# Patient Record
Sex: Female | Born: 1985 | Race: Black or African American | Hispanic: No | Marital: Married | Smoking: Never smoker
Health system: Southern US, Community
[De-identification: ages and names within clinical notes are randomized; demographics above are authoritative.]

## PROBLEM LIST (undated history)

## (undated) DIAGNOSIS — M549 Dorsalgia, unspecified: Secondary | ICD-10-CM

## (undated) DIAGNOSIS — Z8719 Personal history of other diseases of the digestive system: Secondary | ICD-10-CM

## (undated) DIAGNOSIS — F32A Depression, unspecified: Secondary | ICD-10-CM

## (undated) DIAGNOSIS — K219 Gastro-esophageal reflux disease without esophagitis: Secondary | ICD-10-CM

## (undated) DIAGNOSIS — M2550 Pain in unspecified joint: Secondary | ICD-10-CM

## (undated) DIAGNOSIS — F329 Major depressive disorder, single episode, unspecified: Secondary | ICD-10-CM

## (undated) DIAGNOSIS — E559 Vitamin D deficiency, unspecified: Secondary | ICD-10-CM

## (undated) DIAGNOSIS — K859 Acute pancreatitis without necrosis or infection, unspecified: Secondary | ICD-10-CM

## (undated) DIAGNOSIS — D649 Anemia, unspecified: Secondary | ICD-10-CM

## (undated) DIAGNOSIS — Z8711 Personal history of peptic ulcer disease: Secondary | ICD-10-CM

## (undated) DIAGNOSIS — K5090 Crohn's disease, unspecified, without complications: Secondary | ICD-10-CM

## (undated) DIAGNOSIS — F419 Anxiety disorder, unspecified: Secondary | ICD-10-CM

## (undated) HISTORY — DX: Depression, unspecified: F32.A

## (undated) HISTORY — PX: ABDOMINAL SURGERY: SHX537

## (undated) HISTORY — DX: Personal history of peptic ulcer disease: Z87.11

## (undated) HISTORY — DX: Dorsalgia, unspecified: M54.9

## (undated) HISTORY — DX: Gastro-esophageal reflux disease without esophagitis: K21.9

## (undated) HISTORY — DX: Pain in unspecified joint: M25.50

## (undated) HISTORY — PX: TOTAL COLECTOMY: SHX852

## (undated) HISTORY — DX: Vitamin D deficiency, unspecified: E55.9

---
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
## 2005-07-08 ENCOUNTER — Encounter: Admission: RE | Admit: 2005-07-08 | Discharge: 2005-07-08 | Payer: Self-pay

## 2006-08-10 ENCOUNTER — Emergency Department (HOSPITAL_COMMUNITY): Admission: EM | Admit: 2006-08-10 | Discharge: 2006-08-10 | Payer: Self-pay | Admitting: Emergency Medicine

## 2007-05-04 ENCOUNTER — Emergency Department (HOSPITAL_COMMUNITY): Admission: EM | Admit: 2007-05-04 | Discharge: 2007-05-05 | Payer: Self-pay | Admitting: Emergency Medicine

## 2008-05-18 ENCOUNTER — Emergency Department (HOSPITAL_COMMUNITY): Admission: EM | Admit: 2008-05-18 | Discharge: 2008-05-19 | Payer: Self-pay | Admitting: Emergency Medicine

## 2008-10-24 ENCOUNTER — Emergency Department (HOSPITAL_COMMUNITY): Admission: EM | Admit: 2008-10-24 | Discharge: 2008-10-25 | Payer: Self-pay | Admitting: Emergency Medicine

## 2010-02-26 ENCOUNTER — Emergency Department (HOSPITAL_COMMUNITY): Admission: EM | Admit: 2010-02-26 | Discharge: 2010-02-26 | Payer: Self-pay | Admitting: Emergency Medicine

## 2010-04-23 ENCOUNTER — Emergency Department (HOSPITAL_COMMUNITY): Admission: EM | Admit: 2010-04-23 | Discharge: 2010-04-23 | Payer: Self-pay | Admitting: Emergency Medicine

## 2010-05-09 ENCOUNTER — Ambulatory Visit: Payer: Self-pay | Admitting: Diagnostic Radiology

## 2010-05-09 ENCOUNTER — Emergency Department (HOSPITAL_BASED_OUTPATIENT_CLINIC_OR_DEPARTMENT_OTHER): Admission: EM | Admit: 2010-05-09 | Discharge: 2010-05-09 | Payer: Self-pay | Admitting: Emergency Medicine

## 2010-12-03 ENCOUNTER — Emergency Department (HOSPITAL_BASED_OUTPATIENT_CLINIC_OR_DEPARTMENT_OTHER)
Admission: EM | Admit: 2010-12-03 | Discharge: 2010-12-03 | Payer: Self-pay | Source: Home / Self Care | Admitting: Emergency Medicine

## 2010-12-06 LAB — DIFFERENTIAL
Basophils Absolute: 0 10*3/uL (ref 0.0–0.1)
Basophils Relative: 0 % (ref 0–1)
Eosinophils Absolute: 0.5 10*3/uL (ref 0.0–0.7)
Eosinophils Relative: 11 % — ABNORMAL HIGH (ref 0–5)
Lymphocytes Relative: 36 % (ref 12–46)
Lymphs Abs: 1.8 10*3/uL (ref 0.7–4.0)
Monocytes Absolute: 0.7 10*3/uL (ref 0.1–1.0)
Monocytes Relative: 14 % — ABNORMAL HIGH (ref 3–12)
Neutro Abs: 1.9 10*3/uL (ref 1.7–7.7)
Neutrophils Relative %: 38 % — ABNORMAL LOW (ref 43–77)

## 2010-12-06 LAB — URINALYSIS, ROUTINE W REFLEX MICROSCOPIC
Bilirubin Urine: NEGATIVE
Hgb urine dipstick: NEGATIVE
Ketones, ur: NEGATIVE mg/dL
Nitrite: NEGATIVE
Protein, ur: NEGATIVE mg/dL
Specific Gravity, Urine: 1.012 (ref 1.005–1.030)
Urine Glucose, Fasting: NEGATIVE mg/dL
Urobilinogen, UA: 0.2 mg/dL (ref 0.0–1.0)
pH: 5 (ref 5.0–8.0)

## 2010-12-06 LAB — URINE MICROSCOPIC-ADD ON

## 2010-12-06 LAB — COMPREHENSIVE METABOLIC PANEL
ALT: 12 U/L (ref 0–35)
AST: 15 U/L (ref 0–37)
Albumin: 3.5 g/dL (ref 3.5–5.2)
Alkaline Phosphatase: 80 U/L (ref 39–117)
BUN: 4 mg/dL — ABNORMAL LOW (ref 6–23)
CO2: 23 mEq/L (ref 19–32)
Calcium: 8.7 mg/dL (ref 8.4–10.5)
Chloride: 109 mEq/L (ref 96–112)
Creatinine, Ser: 0.7 mg/dL (ref 0.4–1.2)
GFR calc Af Amer: >60 mL/min (ref >60)
GFR calc non Af Amer: >60 mL/min (ref >60)
Glucose, Bld: 87 mg/dL (ref 70–99)
Potassium: 3.9 mEq/L (ref 3.5–5.1)
Sodium: 143 mEq/L (ref 135–145)
Total Bilirubin: 0.4 mg/dL (ref 0.3–1.2)
Total Protein: 7.8 g/dL (ref 6.0–8.3)

## 2010-12-06 LAB — CBC
HCT: 29.7 % — ABNORMAL LOW (ref 36.0–46.0)
Hemoglobin: 9.7 g/dL — ABNORMAL LOW (ref 12.0–15.0)
MCH: 23.9 pg — ABNORMAL LOW (ref 26.0–34.0)
MCHC: 32.7 g/dL (ref 30.0–36.0)
MCV: 73.2 fL — ABNORMAL LOW (ref 78.0–100.0)
Platelets: 348 10*3/uL (ref 150–400)
RBC: 4.06 MIL/uL (ref 3.87–5.11)
RDW: 17 % — ABNORMAL HIGH (ref 11.5–15.5)
WBC: 4.9 10*3/uL (ref 4.0–10.5)

## 2010-12-06 LAB — LIPASE, BLOOD: Lipase: 96 U/L (ref 23–300)

## 2011-02-06 LAB — URINALYSIS, ROUTINE W REFLEX MICROSCOPIC
Bilirubin Urine: NEGATIVE
Glucose, UA: NEGATIVE mg/dL
Hgb urine dipstick: NEGATIVE
Ketones, ur: >80 mg/dL — AB
Nitrite: NEGATIVE
Protein, ur: NEGATIVE mg/dL
Specific Gravity, Urine: 1.007 (ref 1.005–1.030)
Urobilinogen, UA: 0.2 mg/dL (ref 0.0–1.0)
pH: 5.5 (ref 5.0–8.0)

## 2011-02-06 LAB — COMPREHENSIVE METABOLIC PANEL
ALT: 16 U/L (ref 0–35)
AST: 25 U/L (ref 0–37)
Albumin: 3.5 g/dL (ref 3.5–5.2)
Alkaline Phosphatase: 103 U/L (ref 39–117)
BUN: <2 mg/dL — ABNORMAL LOW (ref 6–23)
CO2: 21 mEq/L (ref 19–32)
Calcium: 8.5 mg/dL (ref 8.4–10.5)
Chloride: 104 mEq/L (ref 96–112)
Creatinine, Ser: 0.6 mg/dL (ref 0.4–1.2)
GFR calc Af Amer: >60 mL/min (ref >60)
GFR calc non Af Amer: >60 mL/min (ref >60)
Glucose, Bld: 73 mg/dL (ref 70–99)
Potassium: 4 mEq/L (ref 3.5–5.1)
Sodium: 137 mEq/L (ref 135–145)
Total Bilirubin: 0.4 mg/dL (ref 0.3–1.2)
Total Protein: 8.1 g/dL (ref 6.0–8.3)

## 2011-02-06 LAB — DIFFERENTIAL
Basophils Absolute: 0.1 10*3/uL (ref 0.0–0.1)
Basophils Relative: 1 % (ref 0–1)
Eosinophils Absolute: 0.3 10*3/uL (ref 0.0–0.7)
Eosinophils Relative: 5 % (ref 0–5)
Lymphocytes Relative: 33 % (ref 12–46)
Lymphs Abs: 2.1 10*3/uL (ref 0.7–4.0)
Monocytes Absolute: 0.5 10*3/uL (ref 0.1–1.0)
Monocytes Relative: 7 % (ref 3–12)
Neutro Abs: 3.6 10*3/uL (ref 1.7–7.7)
Neutrophils Relative %: 55 % (ref 43–77)

## 2011-02-06 LAB — CBC
HCT: 35 % — ABNORMAL LOW (ref 36.0–46.0)
Hemoglobin: 11.4 g/dL — ABNORMAL LOW (ref 12.0–15.0)
MCHC: 32.5 g/dL (ref 30.0–36.0)
MCV: 79.4 fL (ref 78.0–100.0)
Platelets: 253 10*3/uL (ref 150–400)
RBC: 4.41 MIL/uL (ref 3.87–5.11)
RDW: 16.4 % — ABNORMAL HIGH (ref 11.5–15.5)
WBC: 6.6 10*3/uL (ref 4.0–10.5)

## 2011-02-06 LAB — URINE MICROSCOPIC-ADD ON

## 2011-02-06 LAB — URINE CULTURE
Colony Count: NO GROWTH
Culture: NO GROWTH

## 2011-02-06 LAB — LIPASE, BLOOD: Lipase: 46 U/L (ref 23–300)

## 2011-02-06 LAB — PREGNANCY, URINE: Preg Test, Ur: NEGATIVE

## 2011-02-09 LAB — BASIC METABOLIC PANEL
BUN: 6 mg/dL (ref 6–23)
CO2: 23 mEq/L (ref 19–32)
Calcium: 8.6 mg/dL (ref 8.4–10.5)
Chloride: 110 mEq/L (ref 96–112)
Creatinine, Ser: 0.63 mg/dL (ref 0.4–1.2)
GFR calc Af Amer: >60 mL/min (ref >60)
GFR calc non Af Amer: >60 mL/min (ref >60)
Glucose, Bld: 86 mg/dL (ref 70–99)
Potassium: 3.6 mEq/L (ref 3.5–5.1)
Sodium: 137 mEq/L (ref 135–145)

## 2011-02-09 LAB — CBC
HCT: 31.6 % — ABNORMAL LOW (ref 36.0–46.0)
Hemoglobin: 10.4 g/dL — ABNORMAL LOW (ref 12.0–15.0)
MCHC: 32.9 g/dL (ref 30.0–36.0)
MCV: 82.5 fL (ref 78.0–100.0)
Platelets: 238 10*3/uL (ref 150–400)
RBC: 3.83 MIL/uL — ABNORMAL LOW (ref 3.87–5.11)
RDW: 15.3 % (ref 11.5–15.5)
WBC: 5 10*3/uL (ref 4.0–10.5)

## 2011-02-09 LAB — URINALYSIS, ROUTINE W REFLEX MICROSCOPIC
Bilirubin Urine: NEGATIVE
Glucose, UA: NEGATIVE mg/dL
Hgb urine dipstick: NEGATIVE
Ketones, ur: NEGATIVE mg/dL
Nitrite: NEGATIVE
Protein, ur: NEGATIVE mg/dL
Specific Gravity, Urine: 1.011 (ref 1.005–1.030)
Urobilinogen, UA: 0.2 mg/dL (ref 0.0–1.0)
pH: 5 (ref 5.0–8.0)

## 2011-02-09 LAB — DIFFERENTIAL
Eosinophils Absolute: 0.2 10*3/uL (ref 0.0–0.7)
Lymphocytes Relative: 23 % (ref 12–46)
Lymphs Abs: 1.1 10*3/uL (ref 0.7–4.0)
Monocytes Relative: 13 % — ABNORMAL HIGH (ref 3–12)
Neutro Abs: 3 10*3/uL (ref 1.7–7.7)
Neutrophils Relative %: 60 % (ref 43–77)

## 2011-02-09 LAB — SEDIMENTATION RATE: Sed Rate: 137 mm/hr — ABNORMAL HIGH (ref 0–22)

## 2011-02-09 LAB — POCT PREGNANCY, URINE: Preg Test, Ur: NEGATIVE

## 2011-02-23 ENCOUNTER — Inpatient Hospital Stay (INDEPENDENT_AMBULATORY_CARE_PROVIDER_SITE_OTHER)
Admission: RE | Admit: 2011-02-23 | Discharge: 2011-02-23 | Disposition: A | Discharge status: Home / Self Care | Payer: Medicare Other | Source: Ambulatory Visit | Attending: Emergency Medicine | Admitting: Emergency Medicine

## 2011-02-23 DIAGNOSIS — L03119 Cellulitis of unspecified part of limb: Secondary | ICD-10-CM

## 2011-02-26 LAB — CULTURE, ROUTINE-ABSCESS: Gram Stain: NONE SEEN

## 2011-06-15 ENCOUNTER — Emergency Department (HOSPITAL_BASED_OUTPATIENT_CLINIC_OR_DEPARTMENT_OTHER)
Admission: EM | Admit: 2011-06-15 | Discharge: 2011-06-16 | Disposition: A | Discharge status: Home / Self Care | Payer: Medicare Other | Attending: Emergency Medicine | Admitting: Emergency Medicine

## 2011-06-15 ENCOUNTER — Encounter: Payer: Self-pay | Admitting: *Deleted

## 2011-06-15 ENCOUNTER — Emergency Department (INDEPENDENT_AMBULATORY_CARE_PROVIDER_SITE_OTHER): Payer: Medicare Other

## 2011-06-15 DIAGNOSIS — S335XXA Sprain of ligaments of lumbar spine, initial encounter: Secondary | ICD-10-CM | POA: Insufficient documentation

## 2011-06-15 DIAGNOSIS — N390 Urinary tract infection, site not specified: Secondary | ICD-10-CM | POA: Insufficient documentation

## 2011-06-15 DIAGNOSIS — K5090 Crohn's disease, unspecified, without complications: Secondary | ICD-10-CM | POA: Insufficient documentation

## 2011-06-15 DIAGNOSIS — X58XXXA Exposure to other specified factors, initial encounter: Secondary | ICD-10-CM | POA: Insufficient documentation

## 2011-06-15 DIAGNOSIS — S39012A Strain of muscle, fascia and tendon of lower back, initial encounter: Secondary | ICD-10-CM

## 2011-06-15 HISTORY — DX: Crohn's disease, unspecified, without complications: K50.90

## 2011-06-15 LAB — URINALYSIS, ROUTINE W REFLEX MICROSCOPIC
Glucose, UA: NEGATIVE mg/dL
Ketones, ur: NEGATIVE mg/dL
Protein, ur: NEGATIVE mg/dL
pH: 5.5 (ref 5.0–8.0)

## 2011-06-15 LAB — URINE MICROSCOPIC-ADD ON

## 2011-06-15 MED ORDER — DEXAMETHASONE SODIUM PHOSPHATE 10 MG/ML IJ SOLN
10.0000 mg | Freq: Once | INTRAMUSCULAR | Status: AC
  Administered 2011-06-15: 10 mg via INTRAMUSCULAR
  Filled 2011-06-15: qty 1

## 2011-06-15 MED ORDER — METHOCARBAMOL 500 MG PO TABS
1000.0000 mg | ORAL_TABLET | Freq: Once | ORAL | Status: AC
  Administered 2011-06-15: 1000 mg via ORAL
  Filled 2011-06-15: qty 2

## 2011-06-15 MED ORDER — FENTANYL CITRATE 0.05 MG/ML IJ SOLN
50.0000 ug | Freq: Once | INTRAMUSCULAR | Status: AC
  Administered 2011-06-15: 50 ug via INTRAMUSCULAR
  Filled 2011-06-15: qty 2

## 2011-06-15 MED ORDER — KETOROLAC TROMETHAMINE 60 MG/2ML IM SOLN
60.0000 mg | Freq: Once | INTRAMUSCULAR | Status: AC
  Administered 2011-06-15: 60 mg via INTRAMUSCULAR
  Filled 2011-06-15: qty 2

## 2011-06-15 NOTE — ED Notes (Signed)
Pt c/o bil lower back pain x 1 week  

## 2011-06-16 DIAGNOSIS — M545 Low back pain: Secondary | ICD-10-CM

## 2011-06-16 MED ORDER — METHOCARBAMOL 500 MG PO TABS: 500.0000 mg | ORAL_TABLET | Freq: Two times a day (BID) | ORAL | Status: AC

## 2011-06-16 MED ORDER — CIPROFLOXACIN HCL 500 MG PO TABS: 500.0000 mg | ORAL_TABLET | Freq: Two times a day (BID) | ORAL | Status: AC

## 2011-06-16 MED ORDER — CIPROFLOXACIN HCL 500 MG PO TABS
500.0000 mg | ORAL_TABLET | Freq: Once | ORAL | Status: AC
  Administered 2011-06-16: 500 mg via ORAL
  Filled 2011-06-16: qty 1

## 2011-06-16 MED ORDER — OXYCODONE-ACETAMINOPHEN 5-325 MG PO TABS: 1.0000 | ORAL_TABLET | Freq: Four times a day (QID) | ORAL | Status: AC | PRN

## 2011-06-16 NOTE — Discharge Instructions (Signed)
Urinary Tract Infection (UTI) Infections of the urinary tract can start in several places. A bladder infection (cystitis), a kidney infection (pyelonephritis), and a prostate infection (prostatitis) are different types of urinary tract infections. They usually get better if treated with medicines (antibiotics) that kill germs. Take all the medicine until it is gone. You or your child may feel better in a few days, but TAKE ALL MEDICINE or the infection may not respond and may become more difficult to treat. HOME CARE INSTRUCTIONS  Drink enough water and fluids to keep the urine clear or pale yellow. Cranberry juice is especially recommended, in addition to large amounts of water.   Avoid caffeine, tea, and carbonated beverages. They tend to irritate the bladder.   Alcohol may irritate the prostate.   Only take over-the-counter or prescription medicines for pain, discomfort, or fever as directed by your caregiver.  FINDING OUT THE RESULTS OF YOUR TEST Not all test results are available during your visit. If your or your child's test results are not back during the visit, make an appointment with your caregiver to find out the results. Do not assume everything is normal if you have not heard from your caregiver or the medical facility. It is important for you to follow up on all test results. TO PREVENT FURTHER INFECTIONS:  Empty the bladder often. Avoid holding urine for long periods of time.   After a bowel movement, women should cleanse from front to back. Use each tissue only once.   Empty the bladder before and after sexual intercourse.  SEEK MEDICAL CARE IF:  There is back pain.   You or your child has an oral temperature above 101 Urinary Tract Infection (UTI) Infections of the urinary tract can start in several places. A bladder infection (cystitis), a kidney infection (pyelonephritis), and a prostate infection (prostatitis) are different types of urinary tract infections. They  usually get better if treated with medicines (antibiotics) that kill germs. Take all the medicine until it is gone. You or your child may feel better in a few days, but TAKE ALL MEDICINE or the infection may not respond and may become more difficult to treat. HOME CARE INSTRUCTIONS Drink enough water and fluids to keep the urine clear or pale yellow. Cranberry juice is especially recommended, in addition to large amounts of water.  Avoid caffeine, tea, and carbonated beverages. They tend to irritate the bladder.  Alcohol may irritate the prostate.  Only take over-the-counter or prescription medicines for pain, discomfort, or fever as directed by your caregiver.  FINDING OUT THE RESULTS OF YOUR TEST Not all test results are available during your visit. If your or your child's test results are not back during the visit, make an appointment with your caregiver to find out the results. Do not assume everything is normal if you have not heard from your caregiver or the medical facility. It is important for you to follow up on all test results. TO PREVENT FURTHER INFECTIONS: Empty the bladder often. Avoid holding urine for long periods of time.  After a bowel movement, women should cleanse from front to back. Use each tissue only once.  Empty the bladder before and after sexual intercourse.  SEEK MEDICAL CARE IF: There is back pain.  You or your child has an oral temperature above 101.  Your baby is older than 3 months with a rectal temperature of 100.5 F (38.1 C) or higher for more than 1 day.  Your or your child's problems (symptoms) are  no better in 3 days. Return sooner if you or your child is getting worse.  SEEK IMMEDIATE MEDICAL CARE IF: There is severe back pain or lower abdominal pain.  You or your child develops chills.  You or your child has an oral temperature above 101, not controlled by medicine.  Your baby is older than 3 months with a rectal temperature of 102 F (38.9 C) or higher.   Your baby is 86 months old or younger with a rectal temperature of 100.4 F (38 C) or higher.  There is nausea or vomiting.  There is continued burning or discomfort with urination.  MAKE SURE YOU: Understand these instructions.  Will watch this condition.  Will get help right away if you or your child is not doing well or gets worse.  Document Released: 08/17/2005 Document Re-Released: 02/01/2010  [facility] Patient Information 2011 [place].   Your baby is older than 3 months with a rectal temperature of 100.5 F (38.1 C) or higher for more than 1 day.   Your or your child's problems (symptoms) are no better in 3 days. Return sooner if you or your child is getting worse.  SEEK IMMEDIATE MEDICAL CARE IF:  There is severe back pain or lower abdominal pain.   You or your child develops chills.   You or your child has an oral temperature above 101.4, not controlled by medicine.   Your baby is older than 3 months with a rectal temperature of 102 F (38.9 C) or higher.   Your baby is 77 months old or younger with a rectal temperature of 100.4 F (38 C) or higher.   There is nausea or vomiting.   There is continued burning or discomfort with urination.  MAKE SURE YOU:  Understand these instructions.   Will watch this condition.   Will get help right away if you or your child is not doing well or gets worse.  Document Released: 08/17/2005 Document Re-Released: 02/01/2010 [facility] Patient Information 2011 [place].Urinary Tract Infection (UTI) Infections of the urinary tract can start in several places. A bladder infection (cystitis), a kidney infection (pyelonephritis), and a prostate infection (prostatitis) are different types of urinary tract infections. They usually get better if treated with medicines (antibiotics) that kill germs. Take all the medicine until it is gone. You or your child may feel better in a few days, but TAKE ALL MEDICINE or the infection may not  respond and may become more difficult to treat. HOME CARE INSTRUCTIONS  Drink enough water and fluids to keep the urine clear or pale yellow. Cranberry juice is especially recommended, in addition to large amounts of water.   Avoid caffeine, tea, and carbonated beverages. They tend to irritate the bladder.   Alcohol may irritate the prostate.   Only take over-the-counter or prescription medicines for pain, discomfort, or fever as directed by your caregiver.  FINDING OUT THE RESULTS OF YOUR TEST Not all test results are available during your visit. If your or your child's test results are not back during the visit, make an appointment with your caregiver to find out the results. Do not assume everything is normal if you have not heard from your caregiver or the medical facility. It is important for you to follow up on all test results. TO PREVENT FURTHER INFECTIONS:  Empty the bladder often. Avoid holding urine for long periods of time.   After a bowel movement, women should cleanse from front to back. Use each tissue only once.  Empty the bladder before and after sexual intercourse.  SEEK MEDICAL CARE IF:  There is back pain.   Your baby is older than 3 months with a rectal temperature of 100.5 F (38.1 C) or higher for more than 1 day.   Your or your child's problems (symptoms) are no better in 3 days. Return sooner if you or your child is getting worse.  SEEK IMMEDIATE MEDICAL CARE IF:  There is severe back pain or lower abdominal pain.   You or your child develops chills.   You or your child has an oral temperature above 101, not controlled by medicine.   There is nausea or vomiting.   There is continued burning or discomfort with urination.  MAKE SURE YOU:  Understand these instructions.   Will watch this condition.   Will get help right away if you or your child is not doing well or gets worse.  Document Released: 08/17/2005 Document Re-Released:  02/01/2010 [facility] Patient Information 2011 [place].

## 2011-06-16 NOTE — ED Provider Notes (Signed)
History     Chief Complaint  Patient presents with  . Back Pain   Patient is a 25 y.o. female presenting with back pain. The history is provided by the patient and the spouse. No language interpreter was used.  Back Pain  This is a new problem. The current episode started more than 2 days ago. The problem occurs constantly. The problem has not changed since onset.The pain is associated with no known injury. The pain is present in the lumbar spine and sacro-iliac joint. The quality of the pain is described as stabbing. The pain does not radiate. The pain is at a severity of 10/10. The pain is severe. The symptoms are aggravated by bending, twisting and certain positions. The pain is the same all the time. Pertinent negatives include no chest pain, no fever, no numbness, no weight loss, no headaches, no abdominal pain, no abdominal swelling, no bowel incontinence, no perianal numbness, no bladder incontinence, no dysuria, no pelvic pain, no leg pain, no paresthesias, no paresis, no tingling and no weakness. She has tried heat and analgesics for the symptoms. The treatment provided no relief. Risk factors: none.    Past Medical History  Diagnosis Date  . Crohn disease     Past Surgical History  Procedure Date  . Abdominal surgery     History reviewed. No pertinent family history.  History  Substance Use Topics  . Smoking status: Never Smoker   . Smokeless tobacco: Not on file  . Alcohol Use: No    OB History    Grav Para Term Preterm Abortions TAB SAB Ect Mult Living                  Review of Systems  Constitutional: Negative for fever, weight loss and activity change.  HENT: Negative for facial swelling.   Eyes: Negative for discharge.  Respiratory: Negative for apnea and chest tightness.   Cardiovascular: Negative for chest pain.  Gastrointestinal: Negative for abdominal pain, abdominal distention and bowel incontinence.  Genitourinary: Negative for bladder incontinence,  dysuria, frequency, hematuria, flank pain, difficulty urinating and pelvic pain.  Musculoskeletal: Positive for back pain. Negative for joint swelling and gait problem.  Skin: Negative.   Neurological: Negative for tingling, weakness, numbness, headaches and paresthesias.  Hematological: Negative for adenopathy.  Psychiatric/Behavioral: Negative.     Physical Exam  BP 92/77  Pulse 88  Temp(Src) 99.2 F (37.3 C) (Oral)  Resp 16  Wt 140 lb (63.504 kg)  SpO2 100%  LMP 06/15/2011  Physical Exam  Constitutional: She is oriented to person, place, and time. She appears well-developed and well-nourished. No distress.  HENT:  Head: Normocephalic and atraumatic.  Mouth/Throat: No oropharyngeal exudate.  Eyes: EOM are normal. Pupils are equal, round, and reactive to light.  Neck: Normal range of motion. Neck supple.  Cardiovascular: Normal rate and regular rhythm.  Exam reveals no gallop and no friction rub.   No murmur heard. Pulmonary/Chest: Effort normal and breath sounds normal. No respiratory distress.  Abdominal: Soft. Bowel sounds are normal.  Musculoskeletal: Normal range of motion. She exhibits no edema and no tenderness.       Cervical back: Normal. She exhibits normal range of motion, no tenderness, no bony tenderness and no swelling.       Thoracic back: Normal. She exhibits normal range of motion and no tenderness.       Lumbar back: Normal. She exhibits normal range of motion, no tenderness, no bony tenderness and no swelling.  Neurological: She is alert and oriented to person, place, and time. She has normal reflexes. Coordination normal.       L5/s1 intact intact perineal sensation  Skin: Skin is warm and dry. She is not diaphoretic.  Psychiatric: She has a normal mood and affect.    ED Course  Procedures  MDM Patient markedly improved: instructed to return for fever, inability to tolerate medication, weakness, numbness, abdominal pain or any concerns.  Follow up with  family doctor within 48 hours.  Patient and husband verbalize understanding and agree to follow up      Freddrick Gladson Smitty Cords, MD 06/16/11 9604

## 2011-08-18 LAB — COMPREHENSIVE METABOLIC PANEL
AST: 16
Albumin: 2.5 — ABNORMAL LOW
BUN: 9
Creatinine, Ser: 0.9
GFR calc Af Amer: >60
Potassium: 3.6
Total Protein: 7.8

## 2011-08-18 LAB — POCT PREGNANCY, URINE: Preg Test, Ur: NEGATIVE

## 2011-08-18 LAB — DIFFERENTIAL
Basophils Relative: 0
Lymphocytes Relative: 9 — ABNORMAL LOW
Monocytes Relative: 9
Neutro Abs: 7.4

## 2011-08-18 LAB — CBC
HCT: 34.2 — ABNORMAL LOW
MCV: 67.2 — ABNORMAL LOW
Platelets: 422 — ABNORMAL HIGH
RDW: 19.8 — ABNORMAL HIGH

## 2011-08-18 LAB — LIPASE, BLOOD: Lipase: 77 — ABNORMAL HIGH

## 2011-08-18 LAB — URINE MICROSCOPIC-ADD ON

## 2011-08-18 LAB — URINALYSIS, ROUTINE W REFLEX MICROSCOPIC
Nitrite: NEGATIVE
Specific Gravity, Urine: 1.023
pH: 6

## 2011-08-26 LAB — POCT I-STAT, CHEM 8
Creatinine, Ser: 0.8 mg/dL (ref 0.4–1.2)
Glucose, Bld: 93 mg/dL (ref 70–99)
Hemoglobin: 10.5 g/dL — ABNORMAL LOW (ref 12.0–15.0)
Sodium: 138 mEq/L (ref 135–145)
TCO2: 24 mmol/L (ref 0–100)

## 2011-08-26 LAB — CBC
Hemoglobin: 9.3 g/dL — ABNORMAL LOW (ref 12.0–15.0)
MCHC: 31.4 g/dL (ref 30.0–36.0)
Platelets: 437 10*3/uL — ABNORMAL HIGH (ref 150–400)
RDW: 19.6 % — ABNORMAL HIGH (ref 11.5–15.5)

## 2011-08-26 LAB — DIFFERENTIAL
Basophils Relative: 0 % (ref 0–1)
Eosinophils Absolute: 0.3 10*3/uL (ref 0.0–0.7)
Monocytes Absolute: 0.8 10*3/uL (ref 0.1–1.0)
Neutrophils Relative %: 41 % — ABNORMAL LOW (ref 43–77)

## 2011-09-08 LAB — DIFFERENTIAL
Basophils Absolute: 0
Basophils Relative: 1
Eosinophils Absolute: 0.2
Eosinophils Relative: 4
Lymphocytes Relative: 43
Lymphs Abs: 2.2
Monocytes Absolute: 0.5
Monocytes Relative: 10
Neutro Abs: 2.3
Neutrophils Relative %: 43

## 2011-09-08 LAB — COMPREHENSIVE METABOLIC PANEL
ALT: 19
AST: 17
Albumin: 2.6 — ABNORMAL LOW
Calcium: 8.9
GFR calc Af Amer: >60
Sodium: 139
Total Protein: 7.9

## 2011-09-08 LAB — URINE MICROSCOPIC-ADD ON

## 2011-09-08 LAB — URINALYSIS, ROUTINE W REFLEX MICROSCOPIC
Bilirubin Urine: NEGATIVE
Glucose, UA: NEGATIVE
Nitrite: NEGATIVE
Specific Gravity, Urine: 1.018
pH: 6

## 2011-09-08 LAB — CBC
MCHC: 32.1
RBC: 4.54
RDW: 16.1 — ABNORMAL HIGH

## 2011-09-08 LAB — PREGNANCY, URINE: Preg Test, Ur: NEGATIVE

## 2012-10-09 ENCOUNTER — Other Ambulatory Visit (HOSPITAL_COMMUNITY)
Admission: RE | Admit: 2012-10-09 | Discharge: 2012-10-09 | Disposition: A | Discharge status: Home / Self Care | Payer: 59 | Source: Ambulatory Visit | Attending: Obstetrics and Gynecology | Admitting: Obstetrics and Gynecology

## 2012-10-09 ENCOUNTER — Other Ambulatory Visit: Payer: Self-pay | Admitting: Nurse Practitioner

## 2012-10-09 DIAGNOSIS — Z01419 Encounter for gynecological examination (general) (routine) without abnormal findings: Secondary | ICD-10-CM | POA: Insufficient documentation

## 2012-11-21 HISTORY — PX: OTHER SURGICAL HISTORY: SHX169

## 2012-12-13 ENCOUNTER — Encounter (HOSPITAL_BASED_OUTPATIENT_CLINIC_OR_DEPARTMENT_OTHER): Payer: Self-pay | Admitting: *Deleted

## 2012-12-13 ENCOUNTER — Emergency Department (HOSPITAL_BASED_OUTPATIENT_CLINIC_OR_DEPARTMENT_OTHER): Payer: 59

## 2012-12-13 ENCOUNTER — Emergency Department (HOSPITAL_BASED_OUTPATIENT_CLINIC_OR_DEPARTMENT_OTHER)
Admission: EM | Admit: 2012-12-13 | Discharge: 2012-12-13 | Disposition: A | Discharge status: Home / Self Care | Payer: 59 | Attending: Emergency Medicine | Admitting: Emergency Medicine

## 2012-12-13 DIAGNOSIS — M533 Sacrococcygeal disorders, not elsewhere classified: Secondary | ICD-10-CM

## 2012-12-13 DIAGNOSIS — K5090 Crohn's disease, unspecified, without complications: Secondary | ICD-10-CM | POA: Insufficient documentation

## 2012-12-13 DIAGNOSIS — Z8719 Personal history of other diseases of the digestive system: Secondary | ICD-10-CM

## 2012-12-13 DIAGNOSIS — Z79899 Other long term (current) drug therapy: Secondary | ICD-10-CM | POA: Insufficient documentation

## 2012-12-13 MED ORDER — OXYCODONE-ACETAMINOPHEN 5-325 MG PO TABS: 1.0000 | ORAL_TABLET | ORAL | Status: DC | PRN

## 2012-12-13 MED ORDER — IBUPROFEN 400 MG PO TABS
600.0000 mg | ORAL_TABLET | Freq: Once | ORAL | Status: AC
  Administered 2012-12-13: 600 mg via ORAL
  Filled 2012-12-13: qty 1

## 2012-12-13 MED ORDER — HYDROMORPHONE HCL PF 1 MG/ML IJ SOLN
1.0000 mg | Freq: Once | INTRAMUSCULAR | Status: AC
  Administered 2012-12-13: 1 mg via INTRAMUSCULAR
  Filled 2012-12-13: qty 1

## 2012-12-13 MED ORDER — IBUPROFEN 400 MG PO TABS: 400.0000 mg | ORAL_TABLET | Freq: Four times a day (QID) | ORAL | Status: DC | PRN

## 2012-12-13 NOTE — ED Notes (Signed)
Patient transported to X-ray 

## 2012-12-13 NOTE — ED Provider Notes (Signed)
History     CSN: [id number]  Arrival date & time 12/13/12  0136   First MD Initiated Contact with Patient 12/13/12 0146      Chief Complaint  Patient presents with  . Back Pain    (Consider location/radiation/quality/duration/timing/severity/associated sxs/prior treatment) HPICherrelle Patel is a 27 y.o. female history of Crohn's disease and prior sacroiliitis. Patient has a history of low back pain ever since she's had Crohn's, and has had flares about every other month. Patient says that sacroiliitis flares are low when she is taking her Humira, she has not been to afford it recently. Pain in the low back is rated as a 9/10, dull, not relieved with Tylenol. Patient finds it difficult to walk due to pain. She denies any numbness, tingling, saddle anesthesia, loss of bowel or bladder continence, urinary retention. Patient has no abdominal pain, no hematochezia or melena, no chest pain, shortness of breath, changes in vision or double vision.  Past Medical History  Diagnosis Date  . Crohn disease     Past Surgical History  Procedure Date  . Abdominal surgery     History reviewed. No pertinent family history.  History  Substance Use Topics  . Smoking status: Never Smoker   . Smokeless tobacco: Not on file  . Alcohol Use: No    OB History    Grav Para Term Preterm Abortions TAB SAB Ect Mult Living                  Review of Systems At least 10pt or greater review of systems completed and are negative except where specified in the HPI.  Allergies  Review of patient's allergies indicates no known allergies.  Home Medications   Current Outpatient Rx  Name  Route  Sig  Dispense  Refill  . ADALIMUMAB 20 MG/0.4ML [place] KIT   Subcutaneous   Inject 1 each into the skin every 7 (seven) days.           [name] LORATADINE 10 MG PO TABS   Oral   Take 10 mg by mouth daily.           . MERCAPTOPURINE 50 MG PO TABS   Oral   Take 75 mg by mouth daily. Give on an empty stomach 1  hour before or 2 hours after meals. Caution: Chemotherapy.            BP 101/60  Pulse 94  Temp 98.7 F (37.1 C) (Oral)  Resp 17  Ht 5\' 4"  (1.626 m)  Wt 150 lb (68.04 kg)  BMI 25.75 kg/m2  SpO2 100%  Physical Exam  Nursing notes reviewed.  Electronic medical record reviewed. VITAL SIGNS:   Filed Vitals:   12/13/12 0144  BP: 101/60  Pulse: 94  Temp: 98.7 F (37.1 C)  TempSrc: Oral  Resp: 17  Height: 5\' 4"  (1.626 m)  Weight: 150 lb (68.04 kg)  SpO2: 100%   CONSTITUTIONAL: Awake, oriented, appears non-toxic HENT: Atraumatic, normocephalic, oral mucosa pink and moist, airway patent. Nares patent without drainage. External ears normal. EYES: Conjunctiva clear, EOMI, PERRLA NECK: Trachea midline, non-tender, supple CARDIOVASCULAR: Normal heart rate, Normal rhythm, No murmurs, rubs, gallops PULMONARY/CHEST: Clear to auscultation, no rhonchi, wheezes, or rales. Symmetrical breath sounds. Non-tender. ABDOMINAL: Non-distended, soft, non-tender - no rebound or guarding.  BS normal. NEUROLOGIC: Non-focal, moving all four extremities, no gross sensory or motor deficits. Patellar reflexes 2+ bilaterally. Back: Tenderness to bilateral sacroiliac joints as well as tenderness to pressure on iliac crests  bilaterally EXTREMITIES: No clubbing, cyanosis, or edema SKIN: Warm, Dry, No erythema, No rash  ED Course  Procedures (including critical care time)  Labs Reviewed - No data to display No results found.   1. Sacro ilial pain   2. H/O Crohn's disease       MDM  Sara Patel is a 27 y.o. female presenting with sacral ileitis flare likely secondary to her Crohn's disease. Patient recently seen a new rheumatologist who has prescribed her sulfasalazine however she has not filled his prescription because it just got filled today. We'll give the patient a short course of ibuprofen and have her start taking the ibuprofen while the sulfasalazine is titrated up, then she will stop  taking ibuprofen. We'll also give her Percocet for breakthrough pain.  Patient received good pain relief while here in the emergency department with IM Dilaudid and ibuprofen, she can followup with her rheumatologist.  Patient is instructed to stop taking ibuprofen for any upset stomach, vomiting, bloody stools or rectal bleeding.  I explained the diagnosis and have given explicit precautions to return to the ER including any other new or worsening symptoms as described. The patient understands and accepts the medical plan as it's been dictated and I have answered her questions. Discharge instructions concerning home care and prescriptions have been given.  The patient is STABLE and is discharged to home in good condition.          Sara Skene, MD 12/13/12 5621

## 2012-12-13 NOTE — ED Notes (Signed)
MD at bedside. 

## 2012-12-13 NOTE — ED Notes (Signed)
C/o pain in her sacrum that radiates through pelvic region. Pt states that this pain happens when her Crohn's disease flares up. Pt denies any injury.

## 2012-12-13 NOTE — ED Notes (Signed)
Pt states that she has had no relief from tylenol PM and is having difficulty walking.

## 2013-03-07 ENCOUNTER — Inpatient Hospital Stay (HOSPITAL_BASED_OUTPATIENT_CLINIC_OR_DEPARTMENT_OTHER)
Admission: EM | Admit: 2013-03-07 | Discharge: 2013-03-14 | DRG: 439 | Disposition: A | Discharge status: Home / Self Care | Payer: 59 | Attending: Internal Medicine | Admitting: Internal Medicine

## 2013-03-07 ENCOUNTER — Encounter (HOSPITAL_BASED_OUTPATIENT_CLINIC_OR_DEPARTMENT_OTHER): Payer: Self-pay

## 2013-03-07 ENCOUNTER — Emergency Department (HOSPITAL_BASED_OUTPATIENT_CLINIC_OR_DEPARTMENT_OTHER): Payer: 59

## 2013-03-07 DIAGNOSIS — K859 Acute pancreatitis without necrosis or infection, unspecified: Principal | ICD-10-CM | POA: Diagnosis present

## 2013-03-07 DIAGNOSIS — Z931 Gastrostomy status: Secondary | ICD-10-CM

## 2013-03-07 DIAGNOSIS — Z9889 Other specified postprocedural states: Secondary | ICD-10-CM

## 2013-03-07 DIAGNOSIS — R748 Abnormal levels of other serum enzymes: Secondary | ICD-10-CM

## 2013-03-07 DIAGNOSIS — I959 Hypotension, unspecified: Secondary | ICD-10-CM

## 2013-03-07 DIAGNOSIS — K5090 Crohn's disease, unspecified, without complications: Secondary | ICD-10-CM | POA: Diagnosis present

## 2013-03-07 DIAGNOSIS — E162 Hypoglycemia, unspecified: Secondary | ICD-10-CM

## 2013-03-07 DIAGNOSIS — E876 Hypokalemia: Secondary | ICD-10-CM

## 2013-03-07 LAB — URINE MICROSCOPIC-ADD ON

## 2013-03-07 LAB — CBC WITH DIFFERENTIAL/PLATELET
Basophils Relative: 0 % (ref 0–1)
Eosinophils Absolute: 0.3 10*3/uL (ref 0.0–0.7)
MCH: 25.7 pg — ABNORMAL LOW (ref 26.0–34.0)
MCHC: 32.6 g/dL (ref 30.0–36.0)
Neutro Abs: 4.8 10*3/uL (ref 1.7–7.7)
Neutrophils Relative %: 58 % (ref 43–77)
Platelets: 433 10*3/uL — ABNORMAL HIGH (ref 150–400)
RBC: 3.97 MIL/uL (ref 3.87–5.11)

## 2013-03-07 LAB — COMPREHENSIVE METABOLIC PANEL
ALT: 8 U/L (ref 0–35)
AST: 9 U/L (ref 0–37)
Albumin: 2.3 g/dL — ABNORMAL LOW (ref 3.5–5.2)
Alkaline Phosphatase: 48 U/L (ref 39–117)
Potassium: 3.4 mEq/L — ABNORMAL LOW (ref 3.5–5.1)
Sodium: 136 mEq/L (ref 135–145)
Total Protein: 7.6 g/dL (ref 6.0–8.3)

## 2013-03-07 LAB — URINALYSIS, ROUTINE W REFLEX MICROSCOPIC
Ketones, ur: NEGATIVE mg/dL
Nitrite: NEGATIVE
Protein, ur: NEGATIVE mg/dL
pH: 6.5 (ref 5.0–8.0)

## 2013-03-07 LAB — PREGNANCY, URINE: Preg Test, Ur: NEGATIVE

## 2013-03-07 MED ORDER — ONDANSETRON HCL 4 MG/2ML IJ SOLN
4.0000 mg | Freq: Once | INTRAMUSCULAR | Status: AC
  Administered 2013-03-07: 4 mg via INTRAVENOUS
  Filled 2013-03-07: qty 2

## 2013-03-07 MED ORDER — HYDROMORPHONE HCL PF 1 MG/ML IJ SOLN
0.5000 mg | INTRAMUSCULAR | Status: DC | PRN
  Administered 2013-03-07: 0.5 mg via INTRAVENOUS
  Administered 2013-03-08 (×3): 1 mg via INTRAVENOUS
  Administered 2013-03-08: 0.5 mg via INTRAVENOUS
  Administered 2013-03-08: 1 mg via INTRAVENOUS
  Administered 2013-03-08 – 2013-03-09 (×2): 0.5 mg via INTRAVENOUS
  Administered 2013-03-09 (×2): 1 mg via INTRAVENOUS
  Filled 2013-03-07 (×11): qty 1

## 2013-03-07 MED ORDER — SODIUM CHLORIDE 0.9 % IV SOLN
INTRAVENOUS | Status: DC
  Administered 2013-03-07 – 2013-03-12 (×10): via INTRAVENOUS

## 2013-03-07 MED ORDER — FENTANYL CITRATE 0.05 MG/ML IJ SOLN
100.0000 ug | Freq: Once | INTRAMUSCULAR | Status: AC
  Administered 2013-03-07: 100 ug via INTRAVENOUS
  Filled 2013-03-07: qty 2

## 2013-03-07 MED ORDER — SODIUM CHLORIDE 0.9 % IV BOLUS (SEPSIS)
1000.0000 mL | Freq: Once | INTRAVENOUS | Status: AC
  Administered 2013-03-07: 1000 mL via INTRAVENOUS

## 2013-03-07 NOTE — ED Notes (Signed)
Report given to Sharia Reeve, [employer].

## 2013-03-07 NOTE — ED Notes (Signed)
Pt sts upper abdominal pain which radiates to bilateral posterior flank areas. Pt sts she had surgery 8days ago for rectal abcess. Denies fever or chills since surgery. Sts she is currently taking a 14 day prescription for Flagyl and Levaquin post-op. Pt has RUQ ileostomy. Pt reports having continued "rumbling" of gas in stomach with severe pain, but is unable to pass gas. Pt reports nausea, but no vomiting. Also decreased PO intake.   Pt also has two circular areas of erythema that are warm to touch on R Leg. Pt reports they are tender and itchy.

## 2013-03-07 NOTE — ED Notes (Signed)
Tiffany, PA-C at bedside.   

## 2013-03-07 NOTE — ED Notes (Signed)
Pt reports abdominal and back pain that started Tuesday.  S/P surgical procedure for a perineal abscess Wednesday 02/27/2013)

## 2013-03-07 NOTE — ED Notes (Signed)
Assigned to bed 6N22 @ [facility], RN notified.

## 2013-03-07 NOTE — ED Provider Notes (Signed)
History     CSN: [id number]  Arrival date & time 03/07/13  1728   First MD Initiated Contact with Patient 03/07/13 1736      Chief Complaint  Patient presents with  . Abdominal Pain  . Back Pain    (Consider location/radiation/quality/duration/timing/severity/associated sxs/prior treatment) HPI  GI- [place] Dr. Lennart Pall  Patient to the ED with complaints of epigastric pain that radiates to her back that started on Tuesday. She had a surgical prodecure for a perineal abscess done on 02/27/2013. She is not having any pain to the perineum at this time. She has severe crohns disease and has an ostomy bag. She has been nauseous but has not had any vomiting or diarrhea. She has been passing gas and having bowel movements which has been a little less than normal. She denies weakness, fevers, headaches. She denies ever having pain like this in the past. Due to her chronic abdominal pains she has had many CT scans and DG abdomen xrays in the past. nad vss  Past Medical History  Diagnosis Date  . Crohn disease   . Crohn's disease     Past Surgical History  Procedure Laterality Date  . Abdominal surgery    . Ileostomy      No family history on file.  History  Substance Use Topics  . Smoking status: Never Smoker   . Smokeless tobacco: Not on file  . Alcohol Use: No    OB History   Grav Para Term Preterm Abortions TAB SAB Ect Mult Living                  Review of Systems  All other systems reviewed and are negative.    Allergies  Review of patient's allergies indicates no known allergies.  Home Medications   Current Outpatient Rx  Name  Route  Sig  Dispense  Refill  . Levofloxacin (LEVAQUIN PO)   Oral   Take by mouth.         . MetroNIDAZOLE (FLAGYL PO)   Oral   Take by mouth.         . Ondansetron HCl (ZOFRAN PO)   Oral   Take by mouth.         [name] ibuprofen (ADVIL,MOTRIN) 400 MG tablet   Oral   Take 1 tablet (400 mg total) by mouth every 6 (six)  hours as needed for pain.   30 tablet   0   . oxyCODONE-acetaminophen (PERCOCET/ROXICET) 5-325 MG per tablet   Oral   Take 1-2 tablets by mouth every 4 (four) hours as needed for pain.   23 tablet   0     BP 103/63  Pulse 87  Temp(Src) 98.2 F (36.8 C) (Oral)  Resp 18  Ht 5\' 4"  (1.626 m)  Wt 150 lb (68.04 kg)  BMI 25.73 kg/m2  SpO2 97%  Physical Exam  Nursing note and vitals reviewed. Constitutional: She appears well-developed and well-nourished. No distress.  HENT:  Head: Normocephalic and atraumatic.  Eyes: Pupils are equal, round, and reactive to light.  Neck: Normal range of motion. Neck supple.  Cardiovascular: Normal rate and regular rhythm.   Pulmonary/Chest: Effort normal.  Abdominal: Soft. There is tenderness in the epigastric area. There is no rigidity, no rebound, no guarding, no CVA tenderness, no tenderness at McBurney's point and negative Murphy's sign.  Neurological: She is alert.  Skin: Skin is warm and dry.    ED Course  Procedures (including critical care time)  Labs Reviewed  URINALYSIS, ROUTINE W REFLEX MICROSCOPIC - Abnormal; Notable for the following:    APPearance CLOUDY (*)    Leukocytes, UA SMALL (*)    All other components within normal limits  CBC WITH DIFFERENTIAL - Abnormal; Notable for the following:    Hemoglobin 10.2 (*)    HCT 31.3 (*)    MCH 25.7 (*)    Platelets 433 (*)    All other components within normal limits  COMPREHENSIVE METABOLIC PANEL - Abnormal; Notable for the following:    Potassium 3.4 (*)    BUN 2 (*)    Albumin 2.3 (*)    Total Bilirubin 0.1 (*)    All other components within normal limits  LIPASE, BLOOD - Abnormal; Notable for the following:    Lipase 477 (*)    All other components within normal limits  URINE MICROSCOPIC-ADD ON - Abnormal; Notable for the following:    Squamous Epithelial / LPF MANY (*)    Bacteria, UA FEW (*)    All other components within normal limits  URINE CULTURE  PREGNANCY,  URINE   Dg Abd 2 Views  03/07/2013  *RADIOLOGY REPORT*  Clinical Data: Epigastric pain.  ABDOMEN - 2 VIEW  Comparison: Radiographs dated 11/23/2010  Findings: There is no free air in the abdomen.  Ostomy is noted in the right lower quadrant.  There are no dilated loops of large or small bowel.  IUD in place.  No osseous abnormality.  IMPRESSION: Benign-appearing abdomen.   Original Report Authenticated By: Francene Boyers, M.D.      1. Crohn's disease   2. Elevated lipase       MDM  Pt given IV fluids and Fentanyl for pain control. She preferred not to have Morphine or Dilaudid.   Lipase is elevated at 477. After discussing with the patient she now remembers she has had pancreatitis before but remembered vomiting with it.  After discussing with Dr. Fredderick Phenix we feel it would be safer for the patient to be monitored in the hospital, hydrated with pain control and to watch the labs return towards baseline.  Spoke with Dr. Lyda Perone who has agreed to accept transfer to [facility], team 10. I spoke with patient who is agreeable and feeling much improved at this time.      Dorthula Matas, PA-C 03/07/13 1944

## 2013-03-07 NOTE — ED Provider Notes (Signed)
Medical screening examination/treatment/procedure(s) were conducted as a shared visit with non-physician practitioner(s) and myself.  I personally evaluated the patient during the encounter   Rolan Bucco, MD 03/07/13 [phone]

## 2013-03-07 NOTE — ED Notes (Signed)
[facility] at bedside preparing pt for transport ?

## 2013-03-08 ENCOUNTER — Inpatient Hospital Stay (HOSPITAL_COMMUNITY): Payer: 59

## 2013-03-08 DIAGNOSIS — K5090 Crohn's disease, unspecified, without complications: Secondary | ICD-10-CM

## 2013-03-08 DIAGNOSIS — K859 Acute pancreatitis without necrosis or infection, unspecified: Secondary | ICD-10-CM | POA: Diagnosis present

## 2013-03-08 LAB — CBC
HCT: 29.3 % — ABNORMAL LOW (ref 36.0–46.0)
Hemoglobin: 9.6 g/dL — ABNORMAL LOW (ref 12.0–15.0)
MCH: 25.3 pg — ABNORMAL LOW (ref 26.0–34.0)
MCHC: 32.8 g/dL (ref 30.0–36.0)
MCV: 77.3 fL — ABNORMAL LOW (ref 78.0–100.0)
RDW: 15.2 % (ref 11.5–15.5)

## 2013-03-08 LAB — COMPREHENSIVE METABOLIC PANEL
Alkaline Phosphatase: 43 U/L (ref 39–117)
BUN: <3 mg/dL — ABNORMAL LOW (ref 6–23)
Calcium: 8.1 mg/dL — ABNORMAL LOW (ref 8.4–10.5)
Creatinine, Ser: 0.67 mg/dL (ref 0.50–1.10)
GFR calc Af Amer: >90 mL/min (ref >90)
Glucose, Bld: 79 mg/dL (ref 70–99)
Potassium: 4.2 mEq/L (ref 3.5–5.1)
Total Protein: 6.7 g/dL (ref 6.0–8.3)

## 2013-03-08 LAB — LIPID PANEL
Cholesterol: 85 mg/dL (ref 0–200)
Total CHOL/HDL Ratio: 2.5 RATIO
Triglycerides: 65 mg/dL (ref <150)

## 2013-03-08 LAB — LIPASE, BLOOD: Lipase: 221 U/L — ABNORMAL HIGH (ref 11–59)

## 2013-03-08 MED ORDER — HEPARIN SODIUM (PORCINE) 5000 UNIT/ML IJ SOLN
5000.0000 [IU] | Freq: Three times a day (TID) | INTRAMUSCULAR | Status: DC
  Administered 2013-03-08 – 2013-03-14 (×18): 5000 [IU] via SUBCUTANEOUS
  Filled 2013-03-08 (×25): qty 1

## 2013-03-08 MED ORDER — METRONIDAZOLE 250 MG PO TABS
250.0000 mg | ORAL_TABLET | Freq: Three times a day (TID) | ORAL | Status: DC
  Administered 2013-03-08 – 2013-03-09 (×6): 250 mg via ORAL
  Filled 2013-03-08 (×10): qty 1

## 2013-03-08 MED ORDER — ONDANSETRON HCL 4 MG/2ML IJ SOLN
4.0000 mg | Freq: Four times a day (QID) | INTRAMUSCULAR | Status: DC | PRN
  Administered 2013-03-08 – 2013-03-10 (×9): 4 mg via INTRAVENOUS
  Filled 2013-03-08 (×8): qty 2

## 2013-03-08 MED ORDER — ONDANSETRON HCL 4 MG PO TABS: 4.0000 mg | ORAL_TABLET | Freq: Four times a day (QID) | ORAL | Status: DC | PRN

## 2013-03-08 MED ORDER — LEVOFLOXACIN 500 MG PO TABS
500.0000 mg | ORAL_TABLET | Freq: Every day | ORAL | Status: DC
  Administered 2013-03-08 – 2013-03-11 (×3): 500 mg via ORAL
  Filled 2013-03-08 (×4): qty 1

## 2013-03-08 NOTE — Progress Notes (Signed)
2:37 PM I agree with HPI/GPe and A/P per Dr. Alcario Drought  Patient doing fair.  Still has abd pain.  No n/v-thinks she can eat.  States pain goes down to her back      HEENT alert pleasant but in pain CHEST clear CARDIAC s1 s2 no m/r/g ABDOMEN soft, NT/ND-ouch in place  Patient Active Problem List  Diagnosis  . Pancreatitis, acute   1. Acute pancreatitis-Unclear etiology-Lipids not elevated.  Has recently been on levaquin and flagyll, but these are not know causative factors.  Will advance die tto clear liquids and reassess in am-US abdomen not denoting any GB stones-rpt lipase and CMEt am-copnt IVF 2. Crohn's disease.  Stable at present 3. Recent rectal abcess surgery-continue levaquin and flagyll  Verneita Griffes, MD [facility] [phone]

## 2013-03-08 NOTE — H&P (Signed)
[facility] History and Physical  [name] RFF:[id number] DOB: 04-24-1986 DOA: 03/07/2013  Referring physician: ED PCP: No primary provider on file.  Specialists: None  Chief Complaint: Abdominal pain  HPI: Sara Patel is a 27 y.o. female who presents with abdominal pain.  Pain is located in epigastric area, radiates to back.  Has history of crohns disease and previous bowel resection with ostomy bag, has been nauseous but no vomiting nor diarrhea.  No fever, no weakness, no headache.  Work up in the ED showed lipase in the 400s, she has been sent to [facility] for admission for apparent pancreatitis.  She reports she does not drink EtOH, no scorpion stings, no history of gallbladder disease that she knows of, no history of dyslipidemia, not taking other meds that can cause pancreatitis.  Review of Systems: 12 systems reviewed and otherwise negative.  Past Medical History  Diagnosis Date  . Crohn disease   . Crohn's disease    Past Surgical History  Procedure Laterality Date  . Abdominal surgery    . Ileostomy     Social History:  reports that she has never smoked. She does not have any smokeless tobacco history on file. She reports that she does not drink alcohol or use illicit drugs.   No Known Allergies  No family history on file. Non-contributory.  Prior to Admission medications   Medication Sig Start Date End Date Taking? Authorizing Provider  levofloxacin (LEVAQUIN) 500 MG tablet Take 500 mg by mouth daily. Started 4.10.14 for 14 days, ending 4.24.14   Yes Historical Provider, MD  metroNIDAZOLE (FLAGYL) 250 MG tablet Take 250 mg by mouth 3 (three) times daily. Started 4.10.14 for 14 days, ending 4.24.14   Yes Historical Provider, MD  ondansetron (ZOFRAN) 4 MG tablet Take 4 mg by mouth every 8 (eight) hours as needed for nausea.   Yes Historical Provider, MD  oxyCODONE-acetaminophen (PERCOCET/ROXICET) 5-325 MG per tablet Take 1-2 tablets by mouth every 4 (four)  hours as needed for pain. 12/13/12  Yes Rhunette Croft, MD   Physical Exam: Filed Vitals:   03/07/13 1736 03/07/13 2021 03/07/13 2127  BP: 103/63 90/54 95/59   Pulse: 87 68 77  Temp: 98.2 F (36.8 C) 98.9 F (37.2 C) 98.5 F (36.9 C)  TempSrc: Oral Oral Oral  Resp: 18 16 16   Height: 5' 4"  (1.626 m)  5' 4"  (1.626 m)  Weight: 68.04 kg (150 lb)  73.619 kg (162 lb 4.8 oz)  SpO2: 97% 100% 100%    General:  NAD, resting comfortably in bed Eyes: PEERLA EOMI ENT: mucous membranes moist Neck: supple w/o JVD Cardiovascular: RRR w/o MRG Respiratory: CTA B Abdomen: soft, tender in epigastric area, nd, bs+ Skin: no rash nor lesion Musculoskeletal: MAE, full ROM all 4 extremities Psychiatric: normal tone and affect Neurologic: AAOx3, grossly non-focal  Labs on Admission:  Basic Metabolic Panel:  Recent Labs Lab 03/07/13 1827  NA 136  K 3.4*  CL 101  CO2 27  GLUCOSE 97  BUN 2*  CREATININE 0.70  CALCIUM 8.5   Liver Function Tests:  Recent Labs Lab 03/07/13 1827  AST 9  ALT 8  ALKPHOS 48  BILITOT 0.1*  PROT 7.6  ALBUMIN 2.3*    Recent Labs Lab 03/07/13 1827  LIPASE 477*   No results found for this basename: AMMONIA,  in the last 168 hours CBC:  Recent Labs Lab 03/07/13 1827  WBC 8.4  NEUTROABS 4.8  HGB 10.2*  HCT 31.3*  MCV 78.8  PLT 433*   Cardiac Enzymes: No results found for this basename: CKTOTAL, CKMB, CKMBINDEX, TROPONINI,  in the last 168 hours  BNP (last 3 results) No results found for this basename: PROBNP,  in the last 8760 hours CBG: No results found for this basename: GLUCAP,  in the last 168 hours  Radiological Exams on Admission: Dg Abd 2 Views  03/07/2013  *RADIOLOGY REPORT*  Clinical Data: Epigastric pain.  ABDOMEN - 2 VIEW  Comparison: Radiographs dated 11/23/2010  Findings: There is no free air in the abdomen.  Ostomy is noted in the right lower quadrant.  There are no dilated loops of large or small bowel.  IUD in place.  No  osseous abnormality.  IMPRESSION: Benign-appearing abdomen.   Original Report Authenticated By: Lorriane Shire, M.D.     EKG: Independently reviewed.  Assessment/Plan Principal Problem:   Pancreatitis, acute   1. Acute pancreatitis - patient on IVF at 175 cc/hr but she states her BP always runs a little low so current BP is not at all unusual for her, NPO, dilaudid for pain, monitor intake and output, lipid panel pending, no obvious cause for pancreatitis at this time, also ordering ultrasound of RUQ. 2. S/P I+D of perineal abscesses - not an issue at the moment other than that we will continue levaquin and flagyl which she is taking at home.    Code Status: Full Code (must indicate code status--if unknown or must be presumed, indicate so) Family Communication: No family in room (indicate person spoken with, if applicable, with phone number if by telephone) Disposition Plan: Admit to inpatient (indicate anticipated LOS)  Time spent: 50 min  Dalyn Becker M. [facility] Pager [phone]  If 7PM-7AM, please contact night-coverage [email or web address] Password [email or web address] 03/08/2013, 1:11 AM

## 2013-03-09 LAB — COMPREHENSIVE METABOLIC PANEL
ALT: 5 U/L (ref 0–35)
AST: 9 U/L (ref 0–37)
CO2: 24 mEq/L (ref 19–32)
Chloride: 107 mEq/L (ref 96–112)
Creatinine, Ser: 0.69 mg/dL (ref 0.50–1.10)
GFR calc Af Amer: >90 mL/min (ref >90)
GFR calc non Af Amer: >90 mL/min (ref >90)
Glucose, Bld: 79 mg/dL (ref 70–99)
Total Bilirubin: 0.1 mg/dL — ABNORMAL LOW (ref 0.3–1.2)

## 2013-03-09 MED ORDER — OXYCODONE-ACETAMINOPHEN 5-325 MG PO TABS
1.0000 | ORAL_TABLET | ORAL | Status: DC | PRN
  Administered 2013-03-09: 1 via ORAL
  Administered 2013-03-09 – 2013-03-10 (×3): 2 via ORAL
  Administered 2013-03-14: 1 via ORAL
  Filled 2013-03-09 (×4): qty 2
  Filled 2013-03-09: qty 1
  Filled 2013-03-09: qty 2

## 2013-03-09 NOTE — Progress Notes (Signed)
PROGRESS NOTE  Sara Patel YNW:[id number] DOB: 07-29-1986 DOA: 03/07/2013 PCP: No primary provider on file.  Brief narrative: 27 year old female with Crohn's disease admitted with pancreatitis  Past medical history-As per Problem list Chart reviewed as below- None  Consultants:  None  Procedures:  None  Antibiotics:  None   Subjective  Doing fair. Had one episode of vomiting this morning. Abdominal pain seems better. Willing to try a diet   Objective   Objective: Filed Vitals:   03/08/13 0637 03/08/13 1143 03/08/13 2143 03/09/13 0612  BP: 86/44 90/50 96/52  100/61  Pulse: 78 68 61 65  Temp: 98.1 F (36.7 C) 98.6 F (37 C) 98.4 F (36.9 C) 98.1 F (36.7 C)  TempSrc: Oral Oral Oral Oral  Resp:   17 16  Height:      Weight:      SpO2: 100% 100% 100% 100%    Intake/Output Summary (Last 24 hours) at 03/09/13 1304 Last data filed at 03/09/13 0616  Gross per 24 hour  Intake 1916.59 ml  Output      0 ml  Net 1916.59 ml    Exam:  HEENT alert pleasant but in pain  CHEST clear  CARDIAC s1 s2 no m/r/g  ABDOMEN soft, NT/ND-puch in place  Skin-Wound to buttocks seems clean and has a drain to aide healing  Data Reviewed: Basic Metabolic Panel:  Recent Labs Lab 03/07/13 1827 03/08/13 0540 03/09/13 0624  NA 136 139 139  K 3.4* 4.2 3.6  CL 101 110 107  CO2 27 23 24   GLUCOSE 97 79 79  BUN 2* <3* <3*  CREATININE 0.70 0.67 0.69  CALCIUM 8.5 8.1* 8.4   Liver Function Tests:  Recent Labs Lab 03/07/13 1827 03/08/13 0540 03/09/13 0624  AST 9 12 9   ALT 8 6 5   ALKPHOS 48 43 51  BILITOT 0.1* 0.1* 0.1*  PROT 7.6 6.7 6.8  ALBUMIN 2.3* 1.8* 2.0*    Recent Labs Lab 03/07/13 1827 03/08/13 0540 03/08/13 1522  LIPASE 477* 482* 221*   No results found for this basename: AMMONIA,  in the last 168 hours CBC:  Recent Labs Lab 03/07/13 1827 03/08/13 0540  WBC 8.4 7.7  NEUTROABS 4.8  --   HGB 10.2* 9.6*  HCT 31.3* 29.3*  MCV 78.8 77.3*  PLT  433* 356   Cardiac Enzymes: No results found for this basename: CKTOTAL, CKMB, CKMBINDEX, TROPONINI,  in the last 168 hours BNP: No components found with this basename: POCBNP,  CBG: No results found for this basename: GLUCAP,  in the last 168 hours  Recent Results (from the past 240 hour(s))  URINE CULTURE     Status: None   Collection Time    03/07/13  5:35 PM      Result Value Range Status   Specimen Description URINE, CLEAN CATCH   Final   Special Requests NONE   Final   Culture  Setup Time 03/08/2013 02:18   Final   Colony Count PENDING   Incomplete   Culture Culture reincubated for better growth   Final   Report Status PENDING   Incomplete     Studies:              All Imaging reviewed and is as per above notation   Scheduled Meds: . heparin  5,000 Units Subcutaneous Q8H  . levofloxacin  500 mg Oral Daily  . metroNIDAZOLE  250 mg Oral Q8H   Continuous Infusions: . sodium chloride 125 mL/hr at 03/09/13 0915  Assessment/Plan: Diagnosis   .  Pancreatitis, acute    1. Acute pancreatitis-Unclear etiology-Lipids not elevated. Has recently been on levaquin and flagyll-stop date ?4.25. Will advance diet to clear liquids-US abdomen not denoting any GB stones-lpase coming down-Rpt am.  Change IV to Oral Percocet 2. Crohn's disease. Stable at present 3. Recent rectal abcess surgery-continue levaquin and flagyll   Code Status: Full Disposition Plan: Inpatient  Verneita Griffes, MD  [facility] Pager [phone] 03/09/2013, 1:04 PM    LOS: 2 days

## 2013-03-10 LAB — COMPREHENSIVE METABOLIC PANEL
Albumin: 2.1 g/dL — ABNORMAL LOW (ref 3.5–5.2)
BUN: <3 mg/dL — ABNORMAL LOW (ref 6–23)
Creatinine, Ser: 0.73 mg/dL (ref 0.50–1.10)
Potassium: 3.5 mEq/L (ref 3.5–5.1)
Total Protein: 6.9 g/dL (ref 6.0–8.3)

## 2013-03-10 LAB — LIPASE, BLOOD
Lipase: 299 U/L — ABNORMAL HIGH (ref 11–59)
Lipase: 299 U/L — ABNORMAL HIGH (ref 11–59)

## 2013-03-10 LAB — SURGICAL PCR SCREEN: MRSA, PCR: INVALID — AB

## 2013-03-10 MED ORDER — HYDROMORPHONE HCL PF 1 MG/ML IJ SOLN
0.5000 mg | INTRAMUSCULAR | Status: DC | PRN
  Administered 2013-03-10 – 2013-03-12 (×7): 1 mg via INTRAVENOUS
  Filled 2013-03-10 (×9): qty 1

## 2013-03-10 MED ORDER — ONDANSETRON HCL 4 MG/2ML IJ SOLN
4.0000 mg | INTRAMUSCULAR | Status: DC | PRN
  Administered 2013-03-10 – 2013-03-12 (×8): 4 mg via INTRAVENOUS
  Filled 2013-03-10 (×10): qty 2

## 2013-03-10 MED ORDER — TRAMADOL HCL 50 MG PO TABS
50.0000 mg | ORAL_TABLET | Freq: Four times a day (QID) | ORAL | Status: DC
  Administered 2013-03-10 – 2013-03-14 (×9): 50 mg via ORAL
  Filled 2013-03-10 (×21): qty 1

## 2013-03-10 MED ORDER — HYDROMORPHONE HCL PF 1 MG/ML IJ SOLN
INTRAMUSCULAR | Status: AC
  Filled 2013-03-10: qty 1

## 2013-03-10 MED ORDER — PROMETHAZINE HCL 25 MG/ML IJ SOLN: 12.5000 mg | Freq: Once | INTRAMUSCULAR | Status: DC

## 2013-03-10 MED ORDER — METRONIDAZOLE IN NACL 5-0.79 MG/ML-% IV SOLN
500.0000 mg | Freq: Three times a day (TID) | INTRAVENOUS | Status: DC
  Administered 2013-03-10 – 2013-03-11 (×4): 500 mg via INTRAVENOUS
  Filled 2013-03-10 (×5): qty 100

## 2013-03-10 MED ORDER — HYDROMORPHONE HCL PF 1 MG/ML IJ SOLN
1.0000 mg | Freq: Once | INTRAMUSCULAR | Status: AC
  Administered 2013-03-10: 1 mg via INTRAVENOUS
  Filled 2013-03-10: qty 1

## 2013-03-10 MED ORDER — ONDANSETRON HCL 4 MG PO TABS: 4.0000 mg | ORAL_TABLET | ORAL | Status: DC | PRN

## 2013-03-10 NOTE — Progress Notes (Addendum)
Vomitted almost immediately after po pain med,which was visible. Orders rec'd.Pt. unable to tol. po antibiotics

## 2013-03-10 NOTE — Progress Notes (Signed)
PROGRESS NOTE  Sara Patel [id number]:[id number] DOB: 09-Nov-1986 DOA: 03/07/2013 PCP: No primary provider on file.  Brief narrative: 27 year old female with Crohn's disease admitted with pancreatitis  Past medical history-As per Problem list Chart reviewed as below- None  Consultants:  None  Procedures:  None  Antibiotics:  None   Subjective  Looks much better.  Vomited still this am.   Objective   Objective: Filed Vitals:   03/09/13 0612 03/09/13 1430 03/09/13 2205 03/10/13 0617  BP: 100/61 110/58 98/53 106/59  Pulse: 65 81 63 69  Temp: 98.1 F (36.7 C) 97.8 F (36.6 C) 98.1 F (36.7 C) 97.9 F (36.6 C)  TempSrc: Oral Oral Oral Oral  Resp: 16 18 18 17   Height:      Weight:      SpO2: 100% 100% 100% 100%    Intake/Output Summary (Last 24 hours) at 03/10/13 1030 Last data filed at 03/10/13 0617  Gross per 24 hour  Intake    900 ml  Output      2 ml  Net    898 ml    Exam:  HEENT alert pleasant but in pain  CHEST clear  CARDIAC s1 s2 no m/r/g  ABDOMEN soft, NT/ND-puch in place    Data Reviewed: Basic Metabolic Panel:  Recent Labs Lab 03/07/13 1827 03/08/13 0540 03/09/13 0624 03/10/13 0515  NA 136 139 139 140  K 3.4* 4.2 3.6 3.5  CL 101 110 107 107  CO2 27 23 24 26   GLUCOSE 97 79 79 83  BUN 2* <3* <3* <3*  CREATININE 0.70 0.67 0.69 0.73  CALCIUM 8.5 8.1* 8.4 8.3*   Liver Function Tests:  Recent Labs Lab 03/07/13 1827 03/08/13 0540 03/09/13 0624 03/10/13 0515  AST 9 12 9 10   ALT 8 6 5 8   ALKPHOS 48 43 51 52  BILITOT 0.1* 0.1* 0.1* 0.1*  PROT 7.6 6.7 6.8 6.9  ALBUMIN 2.3* 1.8* 2.0* 2.1*    Recent Labs Lab 03/07/13 1827 03/08/13 0540 03/08/13 1522 03/10/13 0515  LIPASE 477* 482* 221* 299*   No results found for this basename: AMMONIA,  in the last 168 hours CBC:  Recent Labs Lab 03/07/13 1827 03/08/13 0540  WBC 8.4 7.7  NEUTROABS 4.8  --   HGB 10.2* 9.6*  HCT 31.3* 29.3*  MCV 78.8 77.3*  PLT 433* 356   Cardiac  Enzymes: No results found for this basename: CKTOTAL, CKMB, CKMBINDEX, TROPONINI,  in the last 168 hours BNP: No components found with this basename: POCBNP,  CBG: No results found for this basename: GLUCAP,  in the last 168 hours  Recent Results (from the past 240 hour(s))  URINE CULTURE     Status: None   Collection Time    03/07/13  5:35 PM      Result Value Range Status   Specimen Description URINE, CLEAN CATCH   Final   Special Requests NONE   Final   Culture  Setup Time 03/08/2013 02:18   Final   Colony Count PENDING   Incomplete   Culture Culture reincubated for better growth   Final   Report Status PENDING   Incomplete     Studies:              All Imaging reviewed and is as per above notation   Scheduled Meds: . heparin  5,000 Units Subcutaneous Q8H  . HYDROmorphone      . levofloxacin  500 mg Oral Daily  . metronidazole  500 mg Intravenous  Q8H  . promethazine  12.5 mg Intravenous Once  . traMADol  50 mg Oral Q6H   Continuous Infusions: . sodium chloride 50 mL/hr at 03/09/13 2340     Assessment/Plan: Diagnosis   .  Pancreatitis, acute    1. Acute pancreatitis-Unclear etiology-Lipids not elevated. Has recently been on levaquin and flagyll-stop date ?4.25-transitioned Flagyl to IV 500 q8 on 4.20. Will advance diet to clear liquids-US abdomen not denoting any GB stones-lipase coming down-Rpt am.  continue Dilaudid IV q3 prn.  Changed Percocet to Tramadol given nasuea  4.20 2. Crohn's disease. Stable at present 3. Recent rectal abcess surgery-continue levaquin and flagyll   Code Status: Full Disposition Plan: Inpatient  Verneita Griffes, MD  [facility] Pager [phone] 03/10/2013, 10:30 AM    LOS: 3 days

## 2013-03-11 ENCOUNTER — Encounter (HOSPITAL_COMMUNITY): Payer: Self-pay | Admitting: Radiology

## 2013-03-11 ENCOUNTER — Inpatient Hospital Stay (HOSPITAL_COMMUNITY): Payer: 59

## 2013-03-11 LAB — COMPREHENSIVE METABOLIC PANEL
CO2: 27 mEq/L (ref 19–32)
Calcium: 8.3 mg/dL — ABNORMAL LOW (ref 8.4–10.5)
Creatinine, Ser: 0.71 mg/dL (ref 0.50–1.10)
GFR calc Af Amer: >90 mL/min (ref >90)
GFR calc non Af Amer: >90 mL/min (ref >90)
Glucose, Bld: 73 mg/dL (ref 70–99)

## 2013-03-11 LAB — URINE CULTURE

## 2013-03-11 MED ORDER — ONDANSETRON HCL 4 MG/2ML IJ SOLN
4.0000 mg | Freq: Once | INTRAMUSCULAR | Status: AC
  Administered 2013-03-11: 4 mg via INTRAVENOUS

## 2013-03-11 MED ORDER — PIPERACILLIN-TAZOBACTAM 3.375 G IVPB 30 MIN
3.3750 g | Freq: Three times a day (TID) | INTRAVENOUS | Status: DC
  Administered 2013-03-11 (×2): 3.375 g via INTRAVENOUS
  Filled 2013-03-11 (×4): qty 50

## 2013-03-11 MED ORDER — IOHEXOL 300 MG/ML  SOLN
80.0000 mL | Freq: Once | INTRAMUSCULAR | Status: AC | PRN
  Administered 2013-03-11: 100 mL via INTRAVENOUS

## 2013-03-11 MED ORDER — IOHEXOL 300 MG/ML  SOLN
25.0000 mL | INTRAMUSCULAR | Status: AC
  Administered 2013-03-11: 25 mL via ORAL

## 2013-03-11 NOTE — Progress Notes (Signed)
PROGRESS NOTE  Sara Patel RAQ:[id number] DOB: 09-11-1986 DOA: 03/07/2013 PCP: No primary provider on file.  Brief narrative: 27 year old female with Crohn's disease admitted with pancreatitis  Past medical history-As per Problem list Chart reviewed as below- None  Consultants:  None  Procedures:  None  Antibiotics:  None   Subjective  Emotional.  Worried about causes for pain.  Still very nauseous despite anti-emetics   Objective   Objective: Filed Vitals:   03/10/13 0617 03/10/13 1345 03/10/13 2131 03/11/13 0428  BP: 106/59 96/56 109/68 92/61  Pulse: 69 91 77 65  Temp: 97.9 F (36.6 C) 98.1 F (36.7 C) 98.3 F (36.8 C) 97.9 F (36.6 C)  TempSrc: Oral Oral Oral Oral  Resp: 17 18 16 16   Height:      Weight:      SpO2: 100% 100% 100% 10%    Intake/Output Summary (Last 24 hours) at 03/11/13 1350 Last data filed at 03/11/13 1000  Gross per 24 hour  Intake    852 ml  Output      2 ml  Net    850 ml    Exam:  HEENT alert pleasant but in pain  CHEST clear  CARDIAC s1 s2 no m/r/g  ABDOMEN soft, NT/ND-puch in place    Data Reviewed: Basic Metabolic Panel:  Recent Labs Lab 03/07/13 1827 03/08/13 0540 03/09/13 0624 03/10/13 0515 03/11/13 0519  NA 136 139 139 140 138  K 3.4* 4.2 3.6 3.5 3.5  CL 101 110 107 107 106  CO2 27 23 24 26 27   GLUCOSE 97 79 79 83 73  BUN 2* <3* <3* <3* <3*  CREATININE 0.70 0.67 0.69 0.73 0.71  CALCIUM 8.5 8.1* 8.4 8.3* 8.3*   Liver Function Tests:  Recent Labs Lab 03/07/13 1827 03/08/13 0540 03/09/13 0624 03/10/13 0515 03/11/13 0519  AST 9 12 9 10 9   ALT 8 6 5 8  <5  ALKPHOS 48 43 51 52 43  BILITOT 0.1* 0.1* 0.1* 0.1* 0.1*  PROT 7.6 6.7 6.8 6.9 6.7  ALBUMIN 2.3* 1.8* 2.0* 2.1* 2.0*    Recent Labs Lab 03/07/13 1827 03/08/13 0540 03/08/13 1522 03/10/13 0515 03/11/13 0519  LIPASE 477* 482* 221* 299*  299* 241*   No results found for this basename: AMMONIA,  in the last 168 hours CBC:  Recent  Labs Lab 03/07/13 1827 03/08/13 0540  WBC 8.4 7.7  NEUTROABS 4.8  --   HGB 10.2* 9.6*  HCT 31.3* 29.3*  MCV 78.8 77.3*  PLT 433* 356   Cardiac Enzymes: No results found for this basename: CKTOTAL, CKMB, CKMBINDEX, TROPONINI,  in the last 168 hours BNP: No components found with this basename: POCBNP,  CBG: No results found for this basename: GLUCAP,  in the last 168 hours  Recent Results (from the past 240 hour(s))  URINE CULTURE     Status: None   Collection Time    03/07/13  5:35 PM      Result Value Range Status   Specimen Description URINE, CLEAN CATCH   Final   Special Requests NONE   Final   Culture  Setup Time 03/08/2013 02:18   Final   Colony Count 10,000 COLONIES/ML   Final   Culture     Final   Value: STAPHYLOCOCCUS SPECIES (COAGULASE NEGATIVE)     Note: RIFAMPIN AND GENTAMICIN SHOULD NOT BE USED AS SINGLE DRUGS FOR TREATMENT OF STAPH INFECTIONS.   Report Status 03/11/2013 FINAL   Final   Organism ID, Bacteria STAPHYLOCOCCUS  SPECIES (COAGULASE NEGATIVE)   Final  SURGICAL PCR SCREEN     Status: Abnormal   Collection Time    03/10/13  8:57 AM      Result Value Range Status   MRSA, PCR INVALID RESULTS, SPECIMEN SENT FOR CULTURE (*) NEGATIVE Final   Staphylococcus aureus INVALID RESULTS, SPECIMEN SENT FOR CULTURE (*) NEGATIVE Final   Comment:            The Xpert SA Assay (FDA     approved for NASAL specimens     in patients over 25 years of age),     is one component of     a comprehensive surveillance     program.  Test performance has     been validated by [employer] for patients greater     than or equal to 68 year old.     It is not intended     to diagnose infection nor to     guide or monitor treatment.     Studies:              All Imaging reviewed and is as per above notation   Scheduled Meds: . heparin  5,000 Units Subcutaneous Q8H  . piperacillin-tazobactam  3.375 g Intravenous Q8H  . promethazine  12.5 mg Intravenous Once  . traMADol   50 mg Oral Q6H   Continuous Infusions: . sodium chloride 50 mL/hr at 03/11/13 0153     Assessment/Plan: Diagnosis   .  Pancreatitis, acute    1. Acute pancreatitis-Unclear etiology-Lipids not elevated. Has recently been on levaquin and flagyll-stop date ?4.25-transitioned both to Zosy for 1 more day-d/c clears as still nauseous -US abdomen not denoting any GB stones-lipase coming down- only slowly-Get Ct abd pelvis 4.21 given persisting symptoms [wish to rule out Pseudocyst]- 2. Crohn's disease. Stable at present 3. Recent rectal abcess surgery-continue zosyn-stop date 4/22   Code Status: Full Discussed with Husband at bedside Disposition Plan: Inpatient  Verneita Griffes, MD  [facility] Pager [phone] 03/11/2013, 1:50 PM    LOS: 4 days

## 2013-03-12 LAB — URINALYSIS, MICROSCOPIC ONLY
Bilirubin Urine: NEGATIVE
Glucose, UA: NEGATIVE mg/dL
Ketones, ur: >80 mg/dL — AB
Protein, ur: NEGATIVE mg/dL

## 2013-03-12 LAB — COMPREHENSIVE METABOLIC PANEL
BUN: <3 mg/dL — ABNORMAL LOW (ref 6–23)
Calcium: 8.9 mg/dL (ref 8.4–10.5)
Creatinine, Ser: 0.74 mg/dL (ref 0.50–1.10)
GFR calc Af Amer: >90 mL/min (ref >90)
Glucose, Bld: 52 mg/dL — ABNORMAL LOW (ref 70–99)
Total Protein: 8.1 g/dL (ref 6.0–8.3)

## 2013-03-12 LAB — LIPASE, BLOOD: Lipase: 294 U/L — ABNORMAL HIGH (ref 11–59)

## 2013-03-12 MED ORDER — PIPERACILLIN-TAZOBACTAM 3.375 G IVPB
3.3750 g | Freq: Three times a day (TID) | INTRAVENOUS | Status: DC
  Administered 2013-03-12: 3.375 g via INTRAVENOUS
  Filled 2013-03-12 (×2): qty 50

## 2013-03-12 NOTE — Progress Notes (Addendum)
PROGRESS NOTE  Sara Patel [id number] DOB: 1986-05-29 DOA: 03/07/2013 PCP: No primary provider on file.  Brief narrative: 27 year old female with Crohn's disease admitted with pancreatitis  Past medical history-As per Problem list Chart reviewed as below- None  Consultants:  None  Procedures:  CT scans  Abd Xray  [place] abd  Antibiotics:  None   Subjective  Looks much better.  Tolerating clears well.  No pain today.  ambulant   Objective   Objective: Filed Vitals:   03/11/13 1429 03/11/13 2137 03/12/13 0613 03/12/13 0830  BP: 99/67 106/57 94/58 100/61  Pulse: 59 73 80 76  Temp: 97.9 F (36.6 C) 98.9 F (37.2 C) 98.4 F (36.9 C) 98.1 F (36.7 C)  TempSrc:  Oral Oral Oral  Resp: 16 16 16 16   Height:      Weight:      SpO2: 99% 100% 100% 99%    Intake/Output Summary (Last 24 hours) at 03/12/13 1127 Last data filed at 03/12/13 0500  Gross per 24 hour  Intake   1000 ml  Output      0 ml  Net   1000 ml    Exam:  HEENT alert pleasant but in pain  CHEST clear  CARDIAC s1 s2 no m/r/g  ABDOMEN soft, NT/ND-puch in place    Data Reviewed: Basic Metabolic Panel:  Recent Labs Lab 03/07/13 1827 03/08/13 0540 03/09/13 0624 03/10/13 0515 03/11/13 0519  NA 136 139 139 140 138  K 3.4* 4.2 3.6 3.5 3.5  CL 101 110 107 107 106  CO2 27 23 24 26 27   GLUCOSE 97 79 79 83 73  BUN 2* <3* <3* <3* <3*  CREATININE 0.70 0.67 0.69 0.73 0.71  CALCIUM 8.5 8.1* 8.4 8.3* 8.3*   Liver Function Tests:  Recent Labs Lab 03/07/13 1827 03/08/13 0540 03/09/13 0624 03/10/13 0515 03/11/13 0519  AST 9 12 9 10 9   ALT 8 6 5 8  <5  ALKPHOS 48 43 51 52 43  BILITOT 0.1* 0.1* 0.1* 0.1* 0.1*  PROT 7.6 6.7 6.8 6.9 6.7  ALBUMIN 2.3* 1.8* 2.0* 2.1* 2.0*    Recent Labs Lab 03/07/13 1827 03/08/13 0540 03/08/13 1522 03/10/13 0515 03/11/13 0519  LIPASE 477* 482* 221* 299*  299* 241*   No results found for this basename: AMMONIA,  in the last 168  hours CBC:  Recent Labs Lab 03/07/13 1827 03/08/13 0540  WBC 8.4 7.7  NEUTROABS 4.8  --   HGB 10.2* 9.6*  HCT 31.3* 29.3*  MCV 78.8 77.3*  PLT 433* 356   Cardiac Enzymes: No results found for this basename: CKTOTAL, CKMB, CKMBINDEX, TROPONINI,  in the last 168 hours BNP: No components found with this basename: POCBNP,  CBG: No results found for this basename: GLUCAP,  in the last 168 hours  Recent Results (from the past 240 hour(s))  URINE CULTURE     Status: None   Collection Time    03/07/13  5:35 PM      Result Value Range Status   Specimen Description URINE, CLEAN CATCH   Final   Special Requests NONE   Final   Culture  Setup Time 03/08/2013 02:18   Final   Colony Count 10,000 COLONIES/ML   Final   Culture     Final   Value: STAPHYLOCOCCUS SPECIES (COAGULASE NEGATIVE)     Note: RIFAMPIN AND GENTAMICIN SHOULD NOT BE USED AS SINGLE DRUGS FOR TREATMENT OF STAPH INFECTIONS.   Report Status 03/11/2013 FINAL   Final  Organism ID, Bacteria STAPHYLOCOCCUS SPECIES (COAGULASE NEGATIVE)   Final  SURGICAL PCR SCREEN     Status: Abnormal   Collection Time    03/10/13  8:57 AM      Result Value Range Status   MRSA, PCR INVALID RESULTS, SPECIMEN SENT FOR CULTURE (*) NEGATIVE Final   Staphylococcus aureus INVALID RESULTS, SPECIMEN SENT FOR CULTURE (*) NEGATIVE Final   Comment:            The Xpert SA Assay (FDA     approved for NASAL specimens     in patients over 54 years of age),     is one component of     a comprehensive surveillance     program.  Test performance has     been validated by [employer] for patients greater     than or equal to 54 year old.     It is not intended     to diagnose infection nor to     guide or monitor treatment.  MRSA CULTURE     Status: None   Collection Time    03/10/13  8:57 PM      Result Value Range Status   Specimen Description NASAL SWAB   Final   Special Requests NONE   Final   Culture NO GROWTH 1 DAY   Final   Report  Status PENDING   Incomplete     Studies:              All Imaging reviewed and is as per above notation   Scheduled Meds: . heparin  5,000 Units Subcutaneous Q8H  . piperacillin-tazobactam (ZOSYN)  IV  3.375 g Intravenous Q8H  . promethazine  12.5 mg Intravenous Once  . traMADol  50 mg Oral Q6H   Continuous Infusions: . sodium chloride 50 mL/hr at 03/12/13 0013     Assessment/Plan: Diagnosis   .  Pancreatitis, acute    1. Acute pancreatitis-Unclear etiology-Lipids not elevated. Has recently been on levaquin and flagyll-stop date ?4.25-transitioned both to Zosy for 1 more day ending 4.22-d/c clears as still nauseous -US abdomen not denoting any GB stones-lipase coming down- only slowly-Ct abd pelvis didn't show acute pancreatic issues and probably over-read kidney findings expect a slow progress to orals and likely can d/c home near end of week-overread CT scan with Dr. [name] of Urology who thinks infarct is unlikely- will order a UA 2. Crohn's disease. Stable at present 3. Recent rectal abcess surgery-continue zosyn-stop date 4/22   Code Status: Full Discussed with Husband at bedside Disposition Plan: Inpatient  Verneita Griffes, MD  [facility] Pager [phone] 03/12/2013, 11:27 AM    LOS: 5 days

## 2013-03-13 DIAGNOSIS — R748 Abnormal levels of other serum enzymes: Secondary | ICD-10-CM

## 2013-03-13 DIAGNOSIS — E162 Hypoglycemia, unspecified: Secondary | ICD-10-CM

## 2013-03-13 DIAGNOSIS — I959 Hypotension, unspecified: Secondary | ICD-10-CM

## 2013-03-13 DIAGNOSIS — E876 Hypokalemia: Secondary | ICD-10-CM

## 2013-03-13 LAB — COMPREHENSIVE METABOLIC PANEL
ALT: 8 U/L (ref 0–35)
Albumin: 2.3 g/dL — ABNORMAL LOW (ref 3.5–5.2)
Alkaline Phosphatase: 46 U/L (ref 39–117)
BUN: <3 mg/dL — ABNORMAL LOW (ref 6–23)
Potassium: 3.4 mEq/L — ABNORMAL LOW (ref 3.5–5.1)
Sodium: 139 mEq/L (ref 135–145)
Total Protein: 7.3 g/dL (ref 6.0–8.3)

## 2013-03-13 LAB — CBC
HCT: 28.9 % — ABNORMAL LOW (ref 36.0–46.0)
Hemoglobin: 9.3 g/dL — ABNORMAL LOW (ref 12.0–15.0)
MCHC: 32.2 g/dL (ref 30.0–36.0)

## 2013-03-13 LAB — MRSA CULTURE: Culture: NO GROWTH

## 2013-03-13 LAB — LIPASE, BLOOD: Lipase: 847 U/L — ABNORMAL HIGH (ref 11–59)

## 2013-03-13 LAB — TSH: TSH: 0.296 u[IU]/mL — ABNORMAL LOW (ref 0.350–4.500)

## 2013-03-13 MED ORDER — KCL IN DEXTROSE-NACL 20-5-0.45 MEQ/L-%-% IV SOLN
INTRAVENOUS | Status: DC
  Administered 2013-03-13 – 2013-03-14 (×2): via INTRAVENOUS
  Filled 2013-03-13 (×4): qty 1000

## 2013-03-13 NOTE — Progress Notes (Signed)
PROGRESS NOTE  Sara Patel WCH:[id number] DOB: 01-08-1986 DOA: 03/07/2013 PCP: No primary provider on file.  Brief narrative: 27 year old female with Crohn's disease admitted with pancreatitis  Past medical history-As per Problem list Chart reviewed as below- None   Assessment/Plan: Diagnosis   .  Pancreatitis, acute    1. Acute pancreatitis, no evidence of gallstones and TG normal.  Per patient, she recently underwent EGD and colonoscopy prior to her recent bowel surgery for Crohn's and had no duodenal inflammation, but certainly duodenal crohn's could trigger pancreatitis.  Feeling much better today.   -  Recommend close follow up with Dr. Coralyn Patel, GI, [place].   2. Possible pyelo on CT.  Dr. Verlon Patel reviewed with Dr. Janice Patel of Urology who thinks infarct is unlikely -  UA mildly suggestive of UTI, but asymptomatic.   -  Urine culture with 10K of coag neg staph, likely contaminate.   -  Repeat as outpatient if clinically indicated 3. Crohn's disease. Stable at present.  4. Recent rectal abcess surgery - zosyn completed 4/22 5. Hypotension, asymptomatic.  -  Increase IVF and repeat orthostatics in AM.   -  No evidence of sepsis and recently completed course of broad spectrum abx.   -  TSH and cortisol add on  6.  Hypoglycemia, likely related to poor PO intake due to nausea -  IVF with dextrose -  Advance diet 7.  Hypokalemia, likely iatrogenic -  Add to IVF -  ADvance diet  Code Status: low fat Discussed with Husband at bedside Disposition Plan:  Possible home tomorrow if tolerating diet, fingersticks and blood pressure stable.     Consultants:  None  Procedures:  CT scans  Abd Xray  [place] abd  Antibiotics:  None   Subjective  Feels very well today.  Denies lightheadedness, dizziness when blood pressure is low.  Longstanding problem per patient.  Denies N/V/D/C, abdominal pain.     Objective   Objective: Filed Vitals:   03/13/13 0303 03/13/13  0553 03/13/13 1058 03/13/13 1224  BP: 90/52 101/57 80/62 119/65  Pulse: 60 71 83   Temp: 98.2 F (36.8 C) 98.1 F (36.7 C) 98.8 F (37.1 C)   TempSrc: Oral Oral Oral   Resp: 18 16 16    Height:      Weight:      SpO2: 100% 100% 100%     Intake/Output Summary (Last 24 hours) at 03/13/13 1446 Last data filed at 03/13/13 0500  Gross per 24 hour  Intake   1036 ml  Output      1 ml  Net   1035 ml    Exam:  Gen:  AAF, walking in hall initially, no acute distress HEENT:  NCAT, MMM PULM:  CTAB, no increased WOB  CV:  RRR, nl s1 s2 no m/r/g  ABDOMEN:  NABS, soft, NT/ND, pouch in place  MSK:  Normal tone and bulk, no LEE   Data Reviewed: Basic Metabolic Panel:  Recent Labs Lab 03/09/13 0624 03/10/13 0515 03/11/13 0519 03/12/13 1040 03/13/13 0555  NA 139 140 138 140 139  K 3.6 3.5 3.5 3.5 3.4*  CL 107 107 106 104 104  CO2 24 26 27 22 24   GLUCOSE 79 83 73 52* 63*  BUN <3* <3* <3* <3* <3*  CREATININE 0.69 0.73 0.71 0.74 0.71  CALCIUM 8.4 8.3* 8.3* 8.9 8.7   Liver Function Tests:  Recent Labs Lab 03/09/13 0624 03/10/13 0515 03/11/13 0519 03/12/13 1040 03/13/13  0555  AST 9 10 9 18 18   ALT 5 8 <5 6 8   ALKPHOS 51 52 43 52 46  BILITOT 0.1* 0.1* 0.1* 0.2* 0.1*  PROT 6.8 6.9 6.7 8.1 7.3  ALBUMIN 2.0* 2.1* 2.0* 2.5* 2.3*    Recent Labs Lab 03/08/13 1522 03/10/13 0515 03/11/13 0519 03/12/13 1040 03/13/13 0555  LIPASE 221* 299*  299* 241* 294* 847*   No results found for this basename: AMMONIA,  in the last 168 hours CBC:  Recent Labs Lab 03/07/13 1827 03/08/13 0540 03/13/13 0555  WBC 8.4 7.7 4.8  NEUTROABS 4.8  --   --   HGB 10.2* 9.6* 9.3*  HCT 31.3* 29.3* 28.9*  MCV 78.8 77.3* 77.3*  PLT 433* 356 398   Cardiac Enzymes: No results found for this basename: CKTOTAL, CKMB, CKMBINDEX, TROPONINI,  in the last 168 hours BNP: No components found with this basename: POCBNP,  CBG: No results found for this basename: GLUCAP,  in the last 168  hours  Recent Results (from the past 240 hour(s))  URINE CULTURE     Status: None   Collection Time    03/07/13  5:35 PM      Result Value Range Status   Specimen Description URINE, CLEAN CATCH   Final   Special Requests NONE   Final   Culture  Setup Time 03/08/2013 02:18   Final   Colony Count 10,000 COLONIES/ML   Final   Culture     Final   Value: STAPHYLOCOCCUS SPECIES (COAGULASE NEGATIVE)     Note: RIFAMPIN AND GENTAMICIN SHOULD NOT BE USED AS SINGLE DRUGS FOR TREATMENT OF STAPH INFECTIONS.   Report Status 03/11/2013 FINAL   Final   Organism ID, Bacteria STAPHYLOCOCCUS SPECIES (COAGULASE NEGATIVE)   Final  SURGICAL PCR SCREEN     Status: Abnormal   Collection Time    03/10/13  8:57 AM      Result Value Range Status   MRSA, PCR INVALID RESULTS, SPECIMEN SENT FOR CULTURE (*) NEGATIVE Final   Staphylococcus aureus INVALID RESULTS, SPECIMEN SENT FOR CULTURE (*) NEGATIVE Final   Comment:            The Xpert SA Assay (FDA     approved for NASAL specimens     in patients over 29 years of age),     is one component of     a comprehensive surveillance     program.  Test performance has     been validated by [employer] for patients greater     than or equal to 36 year old.     It is not intended     to diagnose infection nor to     guide or monitor treatment.  MRSA CULTURE     Status: None   Collection Time    03/10/13  8:57 PM      Result Value Range Status   Specimen Description NASAL SWAB   Final   Special Requests NONE   Final   Culture NO GROWTH 2 DAYS   Final   Report Status 03/13/2013 FINAL   Final  MRSA PCR SCREENING     Status: None   Collection Time    03/12/13 12:04 PM      Result Value Range Status   MRSA by PCR NEGATIVE  NEGATIVE Final   Comment:            The GeneXpert MRSA Assay (FDA     approved  for NASAL specimens     only), is one component of a     comprehensive MRSA colonization     surveillance program. It is not     intended to diagnose  MRSA     infection nor to guide or     monitor treatment for     MRSA infections.     Studies:              All Imaging reviewed and is as per above notation   Scheduled Meds: . heparin  5,000 Units Subcutaneous Q8H  . promethazine  12.5 mg Intravenous Once  . traMADol  50 mg Oral Q6H   Continuous Infusions: . dextrose 5 % and 0.45 % NaCl with KCl 20 mEq/L 50 mL/hr at 03/13/13 [place], MD  [facility] Pager [phone] 03/13/2013, 2:46 PM    LOS: 6 days

## 2013-03-13 NOTE — Progress Notes (Signed)
In computer pts IV is dated 03/08/13, but on the actual iv site, its dated 03/10/13; pt sts the iv was put in on 03/10/13.

## 2013-03-14 DIAGNOSIS — E162 Hypoglycemia, unspecified: Secondary | ICD-10-CM

## 2013-03-14 DIAGNOSIS — I959 Hypotension, unspecified: Secondary | ICD-10-CM

## 2013-03-14 LAB — BASIC METABOLIC PANEL
Calcium: 8.7 mg/dL (ref 8.4–10.5)
GFR calc non Af Amer: >90 mL/min (ref >90)
Glucose, Bld: 107 mg/dL — ABNORMAL HIGH (ref 70–99)
Sodium: 136 mEq/L (ref 135–145)

## 2013-03-14 LAB — CBC
MCH: 25.4 pg — ABNORMAL LOW (ref 26.0–34.0)
Platelets: 392 10*3/uL (ref 150–400)
RBC: 3.62 MIL/uL — ABNORMAL LOW (ref 3.87–5.11)
WBC: 6.6 10*3/uL (ref 4.0–10.5)

## 2013-03-14 LAB — CORTISOL-AM, BLOOD: Cortisol - AM: 9.3 ug/dL (ref 4.3–22.4)

## 2013-03-14 MED ORDER — ONDANSETRON HCL 4 MG PO TABS: 4.0000 mg | ORAL_TABLET | Freq: Three times a day (TID) | ORAL | Status: DC | PRN

## 2013-03-14 MED ORDER — OXYCODONE-ACETAMINOPHEN 5-325 MG PO TABS: 1.0000 | ORAL_TABLET | ORAL | Status: DC | PRN

## 2013-03-14 NOTE — Progress Notes (Signed)
Patient discharged to home with instructions, verbalized understanding. 

## 2013-03-14 NOTE — Discharge Summary (Addendum)
Physician Discharge Summary  Sara Patel QZR:[id number] DOB: 10-Aug-1986 DOA: 03/07/2013  PCP: No primary provider on file.  Admit date: 03/07/2013 Discharge date: 03/14/2013  Recommendations for Outpatient Follow-up:  1. Follow up with Dr. Coralyn Mark as soon as possible.   2. Follow up with primary care doctor within 1-2 weeks of discharge to repeat thyroid studies, evaluate for any urinary tract symptoms, work up of anemia if not already complete.     Discharge Diagnoses:  Principal Problem:   Pancreatitis, acute Active Problems:   Hypotension, unspecified   Hypoglycemia   Hypokalemia   Discharge Condition: stable, improved  Diet recommendation: low fat diet  Wt Readings from Last 3 Encounters:  03/07/13 73.619 kg (162 lb 4.8 oz)  12/13/12 68.04 kg (150 lb)  06/15/11 63.504 kg (140 lb)    History of present illness:   Sara Patel is a 27 y.o. female who presents with abdominal pain. Pain is located in epigastric area, radiates to back. Has history of crohns disease and previous bowel resection with ostomy bag, has been nauseous but no vomiting nor diarrhea. No fever, no weakness, no headache.   Work up in the ED showed lipase in the 400s, she has been sent to [facility] for admission for apparent pancreatitis. She reports she does not drink EtOH, no scorpion stings, no history of gallbladder disease that she knows of, no history of dyslipidemia, not taking other meds that can cause pancreatitis.   Hospital Course:   Acute pancreatitis, no evidence of gallstones and TG normal.  Per patient, she recently underwent EGD and colonoscopy prior to her recent bowel surgery for Crohn's and per her report, upper GI disease was quiescent, but certainly duodenal Crohn's could trigger pancreatitis.  Symptoms gradually improved and she was able to tolerated a low fat diet at the time of discharge.  Recommend close follow up with Dr. Coralyn Mark, GI, at [facility].  On the day of discharge, asked to speak to  nutritionist about inflammatory foods. Consult placed.    Possible pyelo vs. Renal infarcts on CT. Dr. Verlon Au reviewed with Dr. [name] of Urology who thinks infarct is unlikely.  UA mildly suggestive of UTI with moderate LE and 7-10 WBC, but squamous epithelials present and only rare bacteria.  Also, Sara Patel was otherwise asymptomatic.  Urine culture grew 10K of coag neg staph, likely contaminate. Repeat as outpatient if clinically indicated  Crohn's disease. Stable at present.   Recent rectal abcess surgery - zosyn completed 4/22  Hypotension, asymptomatic.  Increased IVF and repeat orthostatics were negative.  No evidence of sepsis and she recently completed course of broad spectrum abx for rectal abscess.  AM cortisol level was 9.3.  - TSH was mildly low at 0.296:  PCP to repeat TFTs in a few weeks, likely has sick euthyroid.    Hypoglycemia, likely related to poor PO intake due to nausea with low glycogen stores.  Resolved as she was able to tolerate more food by mouth.    Hypokalemia, likely iatrogenic while patient not eating well and being given IVF without potassium.  Resolved with supplementation and tolerating diet.   Consultants:  None Procedures:  CT scans  Abd Xray  [place] abd Antibiotics:  None   Discharge Exam: Filed Vitals:   03/14/13 0539  BP: 91/61  Pulse: 71  Temp:   Resp: 17   Filed Vitals:   03/13/13 2124 03/14/13 0533 03/14/13 0536 03/14/13 0539  BP: 91/58 89/51 95/65  91/61  Pulse: 73 59 63 71  Temp: 98.1 F (36.7 C) 97.8 F (36.6 C)    TempSrc: Oral Oral    Resp: 18 18 18 17   Height:      Weight:      SpO2: 100% 100% 100% 100%   Feels well.  Was able to eat some dinner without increased pain or nausea.  Has been passing gas and having stools in ostomy.  Feeling well and ambulated around hall yesterday.    Gen: AAF, no acute distress  HEENT: NCAT, MMM  PULM: CTAB, no increased WOB  CV: RRR, nl s1 s2 no m/r/g  ABDOMEN: NABS, soft, NT/ND, pouch in  place  MSK: Normal tone and bulk, no LEE   Discharge Instructions      Discharge Orders   Future Orders Complete By Expires     Call MD for:  difficulty breathing, headache or visual disturbances  As directed     Call MD for:  extreme fatigue  As directed     Call MD for:  hives  As directed     Call MD for:  persistant dizziness or light-headedness  As directed     Call MD for:  persistant nausea and vomiting  As directed     Call MD for:  severe uncontrolled pain  As directed     Call MD for:  temperature >100.4  As directed     Diet general  As directed     Discharge instructions  As directed     Comments:      You were hospitalized with abdominal pain with an elevated lipase suggesting pancreatitis.  Your pain gradually improved and you were able to eat a regular diet without nausea, vomiting, or pain.  You may use zofran for nausea and percocet for abdominal pain.  Please follow up with your gastroenterologist Dr. Coralyn Mark as soon as possible to discuss what may have caused your pancreatitis.  You also had some low blood pressures.  Please have your primary care doctor repeat your thyroid test in a few weeks.  Also, there is a blood test for low cortisol levels which is pending at the time of discharge.  You will be called if the test is abnormal with instructions on what to do next.  Finally, if you have any symptoms of urinary tract infection, please see your primary care doctor right away.    Increase activity slowly  As directed         Medication List    STOP taking these medications       levofloxacin 500 MG tablet  Commonly known as:  LEVAQUIN     metroNIDAZOLE 250 MG tablet  Commonly known as:  FLAGYL      TAKE these medications       ondansetron 4 MG tablet  Commonly known as:  ZOFRAN  Take 1 tablet (4 mg total) by mouth every 8 (eight) hours as needed for nausea.     oxyCODONE-acetaminophen 5-325 MG per tablet  Commonly known as:  PERCOCET/ROXICET  Take 1-2  tablets by mouth every 4 (four) hours as needed for pain.       Follow-up Information   Schedule an appointment as soon as possible for a visit with [facility]. (asap)       The results of significant diagnostics from this hospitalization (including imaging, microbiology, ancillary and laboratory) are listed below for reference.    Significant Diagnostic Studies: US Abdomen Complete  03/08/2013  *RADIOLOGY REPORT*  Clinical Data:  Abdominal  pain.  Pancreatitis.  COMPLETE ABDOMINAL ULTRASOUND  Comparison:  Abdominal CT 05/19/2008.  Findings:  Gallbladder: Well distended without wall thickening, stones or pericholecystic fluid. Negative sonographic Murphy's sign.  Common bile duct:   Normal in caliber without filling defects.  Liver:  Echogenicity is within normal limits.  No focal hepatic abnormalities are identified.  IVC:  Visualized portions appear unremarkable.  Pancreas:  Visualized portions appear unremarkable.There is no evidence of surrounding fluid collection.  Spleen:  Visualized portions appear unremarkable.  Right Kidney:  The renal cortex appears isoechoic to liver or but demonstrates normal thickness. There is no hydronephrosis or focal abnormality. Renal length is 11.1 cm.  Left Kidney:   The renal cortical thickness and echogenicity are preserved.  There is no hydronephrosis or focal abnormality. Renal length is 10.9 cm.  Abdominal aorta:  Visualized portions appear unremarkable.  IMPRESSION:  1.  No acute abdominal findings.  There is no ultrasound evidence of complicated pancreatitis. 2.  The right renal cortex appears isoechoic to the liver.  There is no apparent cortical thinning or hydronephrosis.   Original Report Authenticated By: Richardean Sale, M.D.    Ct Abdomen Pelvis W Contrast  03/11/2013  *RADIOLOGY REPORT*  Clinical Data: Upper abdominal pain with nausea and vomiting. Acute pancreatitis.  CT ABDOMEN AND PELVIS WITH CONTRAST  Technique:  Multidetector CT imaging  of the abdomen and pelvis was performed following the standard protocol during bolus administration of intravenous contrast.  Contrast: 122m OMNIPAQUE IOHEXOL 300 MG/ML  SOLN  Comparison: CT scan dated 05/19/2008  Findings: The patient has focal areas of abnormal perfusion in the upper pole of the right kidney as well as in the lateral aspect of the left upper pole, mid left kidney, and left lower pole.  There is peripheral perfusion in the areas of lucency in the upper and midportion of the left kidney.  These could represent multiple renal infarctions.  Segmental infection could give this appearance although there is no perinephric soft tissue stranding.  The pancreas is diffusely prominent but unchanged since 05/19/2008. There is no peripancreatic soft tissue inflammation or peripancreatic fluid.  No pseudocyst formation.  The liver is normal except for two small areas of focal fatty infiltration adjacent to the falciform ligament, unchanged. Biliary tree is normal.  Spleen and adrenal glands are normal.  The patient has  an ostomy in the right lower quadrant.  The patient has had a right hemicolectomy.  Fluid fills the remaining portion of the colon.  There is a 5 cm cyst on the left ovary and 3.8 cm cyst on the right ovary.  No free fluid in the pelvis. Uterus is normal with an IUD in place.  No significant osseous abnormality.  IMPRESSION: 1.  Multiple perfusion defects in the kidneys worrisome for renal infarctions or areas of pyelonephritis. 2.  The pancreas is diffusely prominent but unchanged with no pancreatic or peripancreatic inflammatory changes.   Original Report Authenticated By: JLorriane Shire M.D.    Dg Abd 2 Views  03/07/2013  *RADIOLOGY REPORT*  Clinical Data: Epigastric pain.  ABDOMEN - 2 VIEW  Comparison: Radiographs dated 11/23/2010  Findings: There is no free air in the abdomen.  Ostomy is noted in the right lower quadrant.  There are no dilated loops of large or small bowel.  IUD in  place.  No osseous abnormality.  IMPRESSION: Benign-appearing abdomen.   Original Report Authenticated By: JLorriane Shire M.D.     Microbiology: Recent Results (from the past 240 hour(s))  URINE CULTURE     Status: None   Collection Time    03/07/13  5:35 PM      Result Value Range Status   Specimen Description URINE, CLEAN CATCH   Final   Special Requests NONE   Final   Culture  Setup Time 03/08/2013 02:18   Final   Colony Count 10,000 COLONIES/ML   Final   Culture     Final   Value: STAPHYLOCOCCUS SPECIES (COAGULASE NEGATIVE)     Note: RIFAMPIN AND GENTAMICIN SHOULD NOT BE USED AS SINGLE DRUGS FOR TREATMENT OF STAPH INFECTIONS.   Report Status 03/11/2013 FINAL   Final   Organism ID, Bacteria STAPHYLOCOCCUS SPECIES (COAGULASE NEGATIVE)   Final  SURGICAL PCR SCREEN     Status: Abnormal   Collection Time    03/10/13  8:57 AM      Result Value Range Status   MRSA, PCR INVALID RESULTS, SPECIMEN SENT FOR CULTURE (*) NEGATIVE Final   Staphylococcus aureus INVALID RESULTS, SPECIMEN SENT FOR CULTURE (*) NEGATIVE Final   Comment:            The Xpert SA Assay (FDA     approved for NASAL specimens     in patients over 56 years of age),     is one component of     a comprehensive surveillance     program.  Test performance has     been validated by [employer] for patients greater     than or equal to 98 year old.     It is not intended     to diagnose infection nor to     guide or monitor treatment.  MRSA CULTURE     Status: None   Collection Time    03/10/13  8:57 PM      Result Value Range Status   Specimen Description NASAL SWAB   Final   Special Requests NONE   Final   Culture NO GROWTH 2 DAYS   Final   Report Status 03/13/2013 FINAL   Final  MRSA PCR SCREENING     Status: None   Collection Time    03/12/13 12:04 PM      Result Value Range Status   MRSA by PCR NEGATIVE  NEGATIVE Final   Comment:            The GeneXpert MRSA Assay (FDA     approved for NASAL  specimens     only), is one component of a     comprehensive MRSA colonization     surveillance program. It is not     intended to diagnose MRSA     infection nor to guide or     monitor treatment for     MRSA infections.     Labs: Basic Metabolic Panel:  Recent Labs Lab 03/10/13 0515 03/11/13 0519 03/12/13 1040 03/13/13 0555 03/14/13 0550  NA 140 138 140 139 136  K 3.5 3.5 3.5 3.4* 3.8  CL 107 106 104 104 103  CO2 26 27 22 24 26   GLUCOSE 83 73 52* 63* 107*  BUN <3* <3* <3* <3* <3*  CREATININE 0.73 0.71 0.74 0.71 0.71  CALCIUM 8.3* 8.3* 8.9 8.7 8.7   Liver Function Tests:  Recent Labs Lab 03/09/13 0624 03/10/13 0515 03/11/13 0519 03/12/13 1040 03/13/13 0555  AST 9 10 9 18 18   ALT 5 8 <5 6 8   ALKPHOS 51 52 43 52 46  BILITOT 0.1*  0.1* 0.1* 0.2* 0.1*  PROT 6.8 6.9 6.7 8.1 7.3  ALBUMIN 2.0* 2.1* 2.0* 2.5* 2.3*    Recent Labs Lab 03/08/13 1522 03/10/13 0515 03/11/13 0519 03/12/13 1040 03/13/13 0555  LIPASE 221* 299*  299* 241* 294* 847*   No results found for this basename: AMMONIA,  in the last 168 hours CBC:  Recent Labs Lab 03/07/13 1827 03/08/13 0540 03/13/13 0555 03/14/13 0550  WBC 8.4 7.7 4.8 6.6  NEUTROABS 4.8  --   --   --   HGB 10.2* 9.6* 9.3* 9.2*  HCT 31.3* 29.3* 28.9* 27.9*  MCV 78.8 77.3* 77.3* 77.1*  PLT 433* 356 398 392   Cardiac Enzymes: No results found for this basename: CKTOTAL, CKMB, CKMBINDEX, TROPONINI,  in the last 168 hours BNP: BNP (last 3 results) No results found for this basename: PROBNP,  in the last 8760 hours CBG: No results found for this basename: GLUCAP,  in the last 168 hours  Time coordinating discharge: 45 minutes  Signed:  Chrishonda Hesch  [facility] 03/14/2013, 9:17 AM

## 2015-08-29 ENCOUNTER — Encounter (HOSPITAL_BASED_OUTPATIENT_CLINIC_OR_DEPARTMENT_OTHER): Payer: Self-pay | Admitting: *Deleted

## 2015-08-29 ENCOUNTER — Emergency Department (HOSPITAL_BASED_OUTPATIENT_CLINIC_OR_DEPARTMENT_OTHER): Payer: 59

## 2015-08-29 ENCOUNTER — Observation Stay (HOSPITAL_BASED_OUTPATIENT_CLINIC_OR_DEPARTMENT_OTHER)
Admission: EM | Admit: 2015-08-29 | Discharge: 2015-08-30 | Disposition: A | Discharge status: Home / Self Care | Payer: 59 | Attending: Internal Medicine | Admitting: Internal Medicine

## 2015-08-29 DIAGNOSIS — Z9049 Acquired absence of other specified parts of digestive tract: Secondary | ICD-10-CM | POA: Diagnosis not present

## 2015-08-29 DIAGNOSIS — K5090 Crohn's disease, unspecified, without complications: Secondary | ICD-10-CM | POA: Insufficient documentation

## 2015-08-29 DIAGNOSIS — K5010 Crohn's disease of large intestine without complications: Secondary | ICD-10-CM | POA: Insufficient documentation

## 2015-08-29 DIAGNOSIS — Z888 Allergy status to other drugs, medicaments and biological substances status: Secondary | ICD-10-CM | POA: Insufficient documentation

## 2015-08-29 DIAGNOSIS — A419 Sepsis, unspecified organism: Principal | ICD-10-CM | POA: Insufficient documentation

## 2015-08-29 DIAGNOSIS — N390 Urinary tract infection, site not specified: Secondary | ICD-10-CM | POA: Insufficient documentation

## 2015-08-29 DIAGNOSIS — K921 Melena: Secondary | ICD-10-CM | POA: Diagnosis present

## 2015-08-29 DIAGNOSIS — K50919 Crohn's disease, unspecified, with unspecified complications: Secondary | ICD-10-CM

## 2015-08-29 HISTORY — DX: Acute pancreatitis without necrosis or infection, unspecified: K85.90

## 2015-08-29 LAB — URINALYSIS, ROUTINE W REFLEX MICROSCOPIC
BILIRUBIN URINE: NEGATIVE
GLUCOSE, UA: NEGATIVE mg/dL
KETONES UR: NEGATIVE mg/dL
Nitrite: NEGATIVE
PH: 5 (ref 5.0–8.0)
Protein, ur: NEGATIVE mg/dL
Specific Gravity, Urine: 1.016 (ref 1.005–1.030)
Urobilinogen, UA: 0.2 mg/dL (ref 0.0–1.0)

## 2015-08-29 LAB — CBC WITH DIFFERENTIAL/PLATELET
BASOS ABS: 0 10*3/uL (ref 0.0–0.1)
BASOS PCT: 0 %
Eosinophils Absolute: 0.5 10*3/uL (ref 0.0–0.7)
Eosinophils Relative: 4 %
HEMATOCRIT: 36.3 % (ref 36.0–46.0)
HEMOGLOBIN: 11.7 g/dL — AB (ref 12.0–15.0)
Lymphocytes Relative: 14 %
Lymphs Abs: 1.8 10*3/uL (ref 0.7–4.0)
MCH: 26.1 pg (ref 26.0–34.0)
MCHC: 32.2 g/dL (ref 30.0–36.0)
MCV: 81 fL (ref 78.0–100.0)
MONOS PCT: 12 %
Monocytes Absolute: 1.5 10*3/uL — ABNORMAL HIGH (ref 0.1–1.0)
NEUTROS ABS: 9.1 10*3/uL — AB (ref 1.7–7.7)
NEUTROS PCT: 71 %
Platelets: 348 10*3/uL (ref 150–400)
RBC: 4.48 MIL/uL (ref 3.87–5.11)
RDW: 16.7 % — ABNORMAL HIGH (ref 11.5–15.5)
WBC: 12.9 10*3/uL — AB (ref 4.0–10.5)

## 2015-08-29 LAB — COMPREHENSIVE METABOLIC PANEL
ALT: 22 U/L (ref 14–54)
AST: 19 U/L (ref 15–41)
Albumin: 3.4 g/dL — ABNORMAL LOW (ref 3.5–5.0)
Alkaline Phosphatase: 66 U/L (ref 38–126)
Anion gap: 6 (ref 5–15)
BUN: 9 mg/dL (ref 6–20)
CHLORIDE: 104 mmol/L (ref 101–111)
CO2: 24 mmol/L (ref 22–32)
CREATININE: 1.04 mg/dL — AB (ref 0.44–1.00)
Calcium: 8.6 mg/dL — ABNORMAL LOW (ref 8.9–10.3)
GFR calc non Af Amer: >60 mL/min (ref >60)
Glucose, Bld: 94 mg/dL (ref 65–99)
Potassium: 3.6 mmol/L (ref 3.5–5.1)
SODIUM: 134 mmol/L — AB (ref 135–145)
Total Bilirubin: 0.2 mg/dL — ABNORMAL LOW (ref 0.3–1.2)
Total Protein: 7.9 g/dL (ref 6.5–8.1)

## 2015-08-29 LAB — PREGNANCY, URINE: Preg Test, Ur: NEGATIVE

## 2015-08-29 LAB — URINE MICROSCOPIC-ADD ON

## 2015-08-29 MED ORDER — SODIUM CHLORIDE 0.9 % IV BOLUS (SEPSIS)
1000.0000 mL | Freq: Once | INTRAVENOUS | Status: AC
  Administered 2015-08-29: 1000 mL via INTRAVENOUS

## 2015-08-29 MED ORDER — HYDROMORPHONE HCL 1 MG/ML IJ SOLN: 1.0000 mg | INTRAMUSCULAR | Status: DC | PRN

## 2015-08-29 MED ORDER — HYDROMORPHONE HCL 1 MG/ML IJ SOLN
1.0000 mg | Freq: Once | INTRAMUSCULAR | Status: AC
  Administered 2015-08-29: 1 mg via INTRAVENOUS
  Filled 2015-08-29: qty 1

## 2015-08-29 MED ORDER — ONDANSETRON HCL 4 MG/2ML IJ SOLN
4.0000 mg | Freq: Once | INTRAMUSCULAR | Status: AC
  Administered 2015-08-29: 4 mg via INTRAVENOUS
  Filled 2015-08-29: qty 2

## 2015-08-29 MED ORDER — IOHEXOL 300 MG/ML  SOLN
100.0000 mL | Freq: Once | INTRAMUSCULAR | Status: AC | PRN
  Administered 2015-08-29: 100 mL via INTRAVENOUS

## 2015-08-29 MED ORDER — IOHEXOL 300 MG/ML  SOLN
50.0000 mL | Freq: Once | INTRAMUSCULAR | Status: AC | PRN
  Administered 2015-08-29: 50 mL via ORAL

## 2015-08-29 MED ORDER — METHYLPREDNISOLONE SODIUM SUCC 125 MG IJ SOLR
125.0000 mg | Freq: Once | INTRAMUSCULAR | Status: AC
  Administered 2015-08-29: 125 mg via INTRAVENOUS
  Filled 2015-08-29: qty 2

## 2015-08-29 MED ORDER — CIPROFLOXACIN IN D5W 400 MG/200ML IV SOLN
400.0000 mg | Freq: Once | INTRAVENOUS | Status: AC
  Administered 2015-08-29: 400 mg via INTRAVENOUS
  Filled 2015-08-29: qty 200

## 2015-08-29 MED ORDER — ONDANSETRON HCL 4 MG/2ML IJ SOLN: 4.0000 mg | Freq: Once | INTRAMUSCULAR | Status: DC | PRN

## 2015-08-29 MED ORDER — METOCLOPRAMIDE HCL 5 MG/ML IJ SOLN
10.0000 mg | Freq: Once | INTRAMUSCULAR | Status: AC
  Administered 2015-08-29: 10 mg via INTRAVENOUS
  Filled 2015-08-29: qty 2

## 2015-08-29 NOTE — ED Notes (Signed)
Pain is more described as sacral pain with radiation to the front of pelvic area

## 2015-08-29 NOTE — ED Provider Notes (Signed)
CSN: [id number]     Arrival date & time 08/29/15  1818 History  By signing my name below, I, Budd Palmer, attest that this documentation has been prepared under the direction and in the presence of [employer], PA-C. Electronically Signed: Budd Palmer, ED Scribe. 08/29/2015. 7:04 PM.     Chief Complaint  Patient presents with  . Rectal Bleeding   The history is provided by the patient. No language interpreter was used.   HPI Comments: Sara Patel is a 29 y.o. female who presents to the Emergency Department complaining of constant, worsening, aching back pain radiating to the front onset 2 weeks ago. She reports associated fever (Tmax 100.5), nausea, and bloody stool. Pt notes a PMHx of Crohn's Disease. She states her PCP is in [place]. She notes she takes Humara, Bentyl, and an iron supplement. Pt denies vomiting and diarrhea. Current discomfort is rated as severe. No other aggravating or modifying factors.  Past Medical History  Diagnosis Date  . Crohn disease (HCC)   . Crohn's disease [facility])    Past Surgical History  Procedure Laterality Date  . Abdominal surgery    . Ileostomy     History reviewed. No pertinent family history. Social History  Substance Use Topics  . Smoking status: Never Smoker   . Smokeless tobacco: None  . Alcohol Use: No   OB History    No data available     Review of Systems  Constitutional: Positive for fever.  Gastrointestinal: Positive for nausea, abdominal pain and blood in stool. Negative for vomiting and diarrhea.  Musculoskeletal: Positive for back pain.  All other systems reviewed and are negative.  Allergies  Review of patient's allergies indicates no known allergies.  Home Medications   Prior to Admission medications   Medication Sig Start Date End Date Taking? Authorizing Provider  ondansetron (ZOFRAN) 4 MG tablet Take 1 tablet (4 mg total) by mouth every 8 (eight) hours as needed for nausea. 03/14/13   Renae Fickle, MD  oxyCODONE-acetaminophen (PERCOCET/ROXICET) 5-325 MG per tablet Take 1-2 tablets by mouth every 4 (four) hours as needed for pain. 03/14/13   Renae Fickle, MD   BP 94/62 mmHg  Pulse 118  Temp(Src) 99.1 F (37.3 C) (Oral)  Resp 18  SpO2 96% Physical Exam  Constitutional: She is oriented to person, place, and time. She appears well-developed and well-nourished.  HENT:  Head: Normocephalic and atraumatic.  Mouth/Throat: Oropharynx is clear and moist.  Eyes: Conjunctivae and EOM are normal. Pupils are equal, round, and reactive to light. Right eye exhibits no discharge. Left eye exhibits no discharge. No scleral icterus.  Neck: Normal range of motion. Neck supple.  Cardiovascular: Regular rhythm and normal heart sounds.   Tachycardic with normal heart sounds  Pulmonary/Chest: Effort normal and breath sounds normal. No respiratory distress. She has no wheezes. She has no rales.  Abdominal: Soft. She exhibits no distension. There is no tenderness.  After pain medication, there is no abdominal tenderness. Abdomen is soft and nondistended. Ostomy site appears well with no skin irritation. Bag is opaque-unable to visualize stoma.  Musculoskeletal: Normal range of motion. She exhibits no tenderness.  Neurological: She is alert and oriented to person, place, and time.  Cranial Nerves II-XII grossly intact  Skin: Skin is warm and dry. No rash noted.  Psychiatric: She has a normal mood and affect.  Nursing note and vitals reviewed.   ED Course  Procedures  DIAGNOSTIC STUDIES: Oxygen Saturation is 97% on RA, adequate  by my interpretation.    COORDINATION OF CARE: 6:44 PM - Discussed plans to order IV fluids, pain medication, and steroids. Pt advised of plan for treatment and pt agrees.  Labs Review Labs Reviewed  URINALYSIS, ROUTINE W REFLEX MICROSCOPIC (NOT AT [facility] : [facility]) - Abnormal; Notable for the following:    APPearance CLOUDY (*)    Hgb urine dipstick MODERATE (*)     Leukocytes, UA LARGE (*)    All other components within normal limits  URINE MICROSCOPIC-ADD ON - Abnormal; Notable for the following:    Squamous Epithelial / LPF FEW (*)    Bacteria, UA FEW (*)    All other components within normal limits  COMPREHENSIVE METABOLIC PANEL - Abnormal; Notable for the following:    Sodium 134 (*)    Creatinine, Ser 1.04 (*)    Calcium 8.6 (*)    Albumin 3.4 (*)    Total Bilirubin 0.2 (*)    All other components within normal limits  CBC WITH DIFFERENTIAL/PLATELET - Abnormal; Notable for the following:    WBC 12.9 (*)    Hemoglobin 11.7 (*)    RDW 16.7 (*)    Neutro Abs 9.1 (*)    Monocytes Absolute 1.5 (*)    All other components within normal limits  PREGNANCY, URINE    Imaging Review Ct Abdomen Pelvis W Contrast  08/29/2015   CLINICAL DATA:  Patient with right lower quadrant abdominal pain for multiple weeks. History of Crohn's. Ileostomy in place. Rectal bleeding 2 weeks.  EXAM: CT ABDOMEN AND PELVIS WITH CONTRAST  TECHNIQUE: Multidetector CT imaging of the abdomen and pelvis was performed using the standard protocol following bolus administration of intravenous contrast.  CONTRAST:  50mL OMNIPAQUE IOHEXOL 300 MG/ML SOLN, OMNIPAQUE IOHEXOL 300 MG/ML SOLN  COMPARISON:  CT abdomen pelvis 03/11/2013  FINDINGS: Lower chest: Dependent atelectasis within the left lower lobe. Normal heart size.  Hepatobiliary: Liver is normal in size and contour. No focal hepatic lesion identified. Gallbladder is unremarkable. No intrahepatic or extrahepatic biliary ductal dilatation.  Pancreas: Unremarkable  Spleen: Unremarkable  Adrenals/Urinary Tract: Normal adrenal glands. Kidneys enhance symmetrically with contrast. No hydronephrosis. Urinary bladder is unremarkable.  Stomach/Bowel: Patient status post right hemicolectomy with ostomy in the right lower quadrant. Colon is fluid-filled. Multiple prominent mesenteric lymph nodes are demonstrated, many of which have slightly  increased in size when compared to prior exam. Reference lymph node measures 10 mm (image 55; series 2), previously 5 mm. No dilated small bowel. No significant inflammatory change demonstrated within the right lower quadrant.  Vascular/Lymphatic: Normal caliber abdominal aorta. Multiple sub cm retroperitoneal lymph nodes  Other: Intrauterine device is present. Adnexal structures are unremarkable.  Musculoskeletal: No aggressive or acute appearing osseous lesions.  IMPRESSION: No evidence for bowel obstruction. No significant inflammatory change demonstrated within the right lower quadrant.  Multiple prominent mesenteric and retroperitoneal lymph nodes, increased from prior, are nonspecific however likely reactive in etiology given patient's history of Crohn's disease.   Electronically Signed   By: Annia Belt M.D.   On: 08/29/2015 22:02   I have personally reviewed and evaluated these images and lab results as part of my medical decision-making.   EKG Interpretation None     Meds given in ED:  Medications  HYDROmorphone (DILAUDID) injection 1 mg (not administered)  ondansetron (ZOFRAN) injection 4 mg (not administered)  sodium chloride 0.9 % bolus 1,000 mL (not administered)  HYDROmorphone (DILAUDID) injection 1 mg (1 mg Intravenous Given 08/29/15 1917)  sodium chloride  0.9 % bolus 1,000 mL (1,000 mLs Intravenous New Bag/Given 08/29/15 1917)  ondansetron (ZOFRAN) injection 4 mg (4 mg Intravenous Given 08/29/15 1918)  metoCLOPramide (REGLAN) injection 10 mg (10 mg Intravenous Given 08/29/15 1959)  methylPREDNISolone sodium succinate (SOLU-MEDROL) 125 mg/2 mL injection 125 mg (125 mg Intravenous Given 08/29/15 2042)  iohexol (OMNIPAQUE) 300 MG/ML solution 50 mL (50 mLs Oral Contrast Given 08/29/15 1920)  iohexol (OMNIPAQUE) 300 MG/ML solution 100 mL (100 mLs Intravenous Contrast Given 08/29/15 2133)    New Prescriptions   No medications on file   Filed Vitals:   08/29/15 1930 08/29/15 2000  08/29/15 2030 08/29/15 2036  BP: 107/64 101/60 94/62 94/62   Pulse: 116 106 117 118  Temp:      TempSrc:      Resp:    18  SpO2: 88% 98% 94% 96%    MDM  Trace Parkinson is a 29 y.o. female with history of Crohn's, on Humira and followed by GI at [facility], comes in for evaluation of abdominal pain. Patient has been taking her medications as prescribed, but has been experiencing increased abdominal pain and rectal bleeding. On arrival, patient is tachycardic to 130. Temperature is 99.37F. Tachycardia has improved with 2 L of normal saline to 118. However, blood pressure is slightly hypotensive at 94/62. Patient with original abdominal discomfort on arrival, but pain has resolved with IV pain medication. No focal tenderness on exam. Ostomy site appears well with no surrounding skin irritation. Labs are significant for leukocytosis of 12.9 with 9.1 neutrophils. Evidence of UTI on urinalysis with large leukocytes and 11-20 white cells. Due to her compromised state, will obtain CT abdomen. CT abdomen shows no bowel obstruction or significant inflammatory changes. There are, however, multiple prominent mesenteric and retroperitoneal lymph nodes increased from prior study, likely reactive.  At this time, believe patient would benefit from admission to the hospital for observation and fluid rehydration. Will treat likely UTI and possible abdominal infection with Cipro. Discussed with my attending, Dr. Fayrene Fearing who agrees with plan for medical admission.  Discussed with Dr. Clyde Lundborg. Patient admitted to telemetry bed at [facility]. Final diagnoses:  Crohn's disease with complication, unspecified gastrointestinal tract location [facility])    I personally performed the services described in this documentation, which was scribed in my presence. The recorded information has been reviewed and is accurate.   Joycie Peek, PA-C 08/29/15 2311  Rolland Porter, MD 08/30/15 2256

## 2015-08-29 NOTE — ED Notes (Signed)
RN Georgeanna Lea placed Pt. On on-going pulse ox and b/p due to Pt. Having narcotics for pain and PRN order for narcotics.

## 2015-08-29 NOTE — ED Notes (Signed)
PA-C at bedside 

## 2015-08-29 NOTE — ED Notes (Signed)
Over to CT to check on Pt. With poss. Nausea from movement to CT table.  Pt. Doing fine at this time.

## 2015-08-29 NOTE — ED Notes (Signed)
Having abdominal  pain, for the past couple of weeks, hx of crohn's , have ileostomy in place, RLQ, no changes noted at this area per pt

## 2015-08-29 NOTE — ED Notes (Signed)
Patient c/o rectal bleeding & ,pain, Hx Crons disease for the past two weeks, took pain medication around 13:30 with some relief

## 2015-08-29 NOTE — ED Notes (Signed)
Pt positioned for comfort, emotional support provided, warm blanket provided, husband at side

## 2015-08-29 NOTE — ED Notes (Signed)
Site of ileostomy wnl, skin area WNL

## 2015-08-30 DIAGNOSIS — K5010 Crohn's disease of large intestine without complications: Secondary | ICD-10-CM

## 2015-08-30 DIAGNOSIS — A419 Sepsis, unspecified organism: Secondary | ICD-10-CM

## 2015-08-30 DIAGNOSIS — N390 Urinary tract infection, site not specified: Secondary | ICD-10-CM | POA: Diagnosis not present

## 2015-08-30 DIAGNOSIS — M461 Sacroiliitis, not elsewhere classified: Secondary | ICD-10-CM

## 2015-08-30 DIAGNOSIS — K50111 Crohn's disease of large intestine with rectal bleeding: Secondary | ICD-10-CM | POA: Diagnosis not present

## 2015-08-30 DIAGNOSIS — B3741 Candidal cystitis and urethritis: Secondary | ICD-10-CM | POA: Diagnosis not present

## 2015-08-30 DIAGNOSIS — N3000 Acute cystitis without hematuria: Secondary | ICD-10-CM | POA: Diagnosis not present

## 2015-08-30 LAB — LIPASE, BLOOD: LIPASE: 27 U/L (ref 22–51)

## 2015-08-30 MED ORDER — NAPROXEN 500 MG PO TABS: 500.0000 mg | ORAL_TABLET | Freq: Three times a day (TID) | ORAL | Status: DC | PRN

## 2015-08-30 MED ORDER — METRONIDAZOLE IN NACL 5-0.79 MG/ML-% IV SOLN
500.0000 mg | Freq: Three times a day (TID) | INTRAVENOUS | Status: DC
  Administered 2015-08-30 (×2): 500 mg via INTRAVENOUS
  Filled 2015-08-30 (×2): qty 100

## 2015-08-30 MED ORDER — SODIUM CHLORIDE 0.9 % IV SOLN
INTRAVENOUS | Status: DC
  Administered 2015-08-30: 02:00:00 via INTRAVENOUS

## 2015-08-30 MED ORDER — METRONIDAZOLE 500 MG PO TABS: 500.0000 mg | ORAL_TABLET | Freq: Three times a day (TID) | ORAL | Status: DC

## 2015-08-30 MED ORDER — METHYLPREDNISOLONE SODIUM SUCC 125 MG IJ SOLR: 60.0000 mg | Freq: Two times a day (BID) | INTRAMUSCULAR | Status: DC

## 2015-08-30 MED ORDER — CIPROFLOXACIN HCL 500 MG PO TABS: 500.0000 mg | ORAL_TABLET | Freq: Two times a day (BID) | ORAL | Status: DC

## 2015-08-30 MED ORDER — HYDROMORPHONE HCL 1 MG/ML IJ SOLN: 1.0000 mg | INTRAMUSCULAR | Status: DC | PRN

## 2015-08-30 MED ORDER — FLUCONAZOLE 100 MG PO TABS: 200.0000 mg | ORAL_TABLET | Freq: Once | ORAL | Status: DC

## 2015-08-30 MED ORDER — FLUCONAZOLE 150 MG PO TABS: 150.0000 mg | ORAL_TABLET | Freq: Every day | ORAL | Status: DC

## 2015-08-30 MED ORDER — CIPROFLOXACIN IN D5W 400 MG/200ML IV SOLN
400.0000 mg | Freq: Two times a day (BID) | INTRAVENOUS | Status: DC
  Administered 2015-08-30: 400 mg via INTRAVENOUS
  Filled 2015-08-30: qty 200

## 2015-08-30 NOTE — Progress Notes (Signed)
Patient discharge teaching given, including activity, diet, follow-up appoints, and medications. Patient verbalized understanding of all discharge instructions. IV access was d/c'd. Vitals are stable. Skin is intact except as charted in most recent assessments. Pt to be escorted out by NT, to be driven home by family. 

## 2015-08-30 NOTE — H&P (Signed)
[facility] History and Physical  Sara Patel IOX:[id number] DOB: Oct 10, 1986 DOA: 08/29/2015  Referring physician: EDP PCP: No PCP Per Patient   Chief Complaint: Abd pain   HPI: Sara Patel is a 29 y.o. female with h/o Crohn's disease, currently on Humira, abdominal surgeries including colectomy and ileostomy for same.  Patient presents to the ED with c/o back pain radiating to the front onset 2 weeks ago.  Has had associated fever with Tm of 100.5.  Last humira dose was on Tues.  She has nausea and bloody stool as well.  Review of Systems: Systems reviewed.  As above, otherwise negative  Past Medical History  Diagnosis Date  . Crohn disease ([place])   . Crohn's disease ([place])   . Pancreatitis    Past Surgical History  Procedure Laterality Date  . Abdominal surgery    . Ileostomy     Social History:  reports that she has never smoked. She does not have any smokeless tobacco history on file. She reports that she does not drink alcohol or use illicit drugs.  No Known Allergies  History reviewed. No pertinent family history.   Prior to Admission medications   Medication Sig Start Date End Date Taking? Authorizing Provider  ondansetron (ZOFRAN) 4 MG tablet Take 1 tablet (4 mg total) by mouth every 8 (eight) hours as needed for nausea. 03/14/13   Janece Canterbury, MD  oxyCODONE-acetaminophen (PERCOCET/ROXICET) 5-325 MG per tablet Take 1-2 tablets by mouth every 4 (four) hours as needed for pain. 03/14/13   Janece Canterbury, MD   Physical Exam: Filed Vitals:   08/30/15 0049  BP: 104/54  Pulse: 89  Temp: 98.8 F (37.1 C)  Resp: 18    BP 104/54 mmHg  Pulse 89  Temp(Src) 98.8 F (37.1 C) (Oral)  Resp 18  SpO2 97%  General Appearance:    Alert, oriented, no distress, appears stated age  Head:    Normocephalic, atraumatic  Eyes:    PERRL, EOMI, sclera non-icteric        Nose:   Nares without drainage or epistaxis. Mucosa, turbinates normal  Throat:   Moist mucous  membranes. Oropharynx without erythema or exudate.  Neck:   Supple. No carotid bruits.  No thyromegaly.  No lymphadenopathy.   Back:     No CVA tenderness, no spinal tenderness  Lungs:     Clear to auscultation bilaterally, without wheezes, rhonchi or rales  Chest wall:    No tenderness to palpitation  Heart:    Regular rate and rhythm without murmurs, gallops, rubs  Abdomen:     Soft, non-tender, nondistended, normal bowel sounds, no organomegaly  Genitalia:    deferred  Rectal:    deferred  Extremities:   No clubbing, cyanosis or edema.  Pulses:   2+ and symmetric all extremities  Skin:   Skin color, texture, turgor normal, no rashes or lesions  Lymph nodes:   Cervical, supraclavicular, and axillary nodes normal  Neurologic:   CNII-XII intact. Normal strength, sensation and reflexes      throughout    Labs on Admission:  Basic Metabolic Panel:  Recent Labs Lab 08/29/15 1910  NA 134*  K 3.6  CL 104  CO2 24  GLUCOSE 94  BUN 9  CREATININE 1.04*  CALCIUM 8.6*   Liver Function Tests:  Recent Labs Lab 08/29/15 1910  AST 19  ALT 22  ALKPHOS 66  BILITOT 0.2*  PROT 7.9  ALBUMIN 3.4*   No results for input(s): LIPASE, AMYLASE in  the last 168 hours. No results for input(s): AMMONIA in the last 168 hours. CBC:  Recent Labs Lab 08/29/15 1910  WBC 12.9*  NEUTROABS 9.1*  HGB 11.7*  HCT 36.3  MCV 81.0  PLT 348   Cardiac Enzymes: No results for input(s): CKTOTAL, CKMB, CKMBINDEX, TROPONINI in the last 168 hours.  BNP (last 3 results) No results for input(s): PROBNP in the last 8760 hours. CBG: No results for input(s): GLUCAP in the last 168 hours.  Radiological Exams on Admission: Ct Abdomen Pelvis W Contrast  08/29/2015   CLINICAL DATA:  Patient with right lower quadrant abdominal pain for multiple weeks. History of Crohn's. Ileostomy in place. Rectal bleeding 2 weeks.  EXAM: CT ABDOMEN AND PELVIS WITH CONTRAST  TECHNIQUE: Multidetector CT imaging of the abdomen  and pelvis was performed using the standard protocol following bolus administration of intravenous contrast.  CONTRAST:  39m OMNIPAQUE IOHEXOL 300 MG/ML SOLN, 1022mOMNIPAQUE IOHEXOL 300 MG/ML SOLN  COMPARISON:  CT abdomen pelvis 03/11/2013  FINDINGS: Lower chest: Dependent atelectasis within the left lower lobe. Normal heart size.  Hepatobiliary: Liver is normal in size and contour. No focal hepatic lesion identified. Gallbladder is unremarkable. No intrahepatic or extrahepatic biliary ductal dilatation.  Pancreas: Unremarkable  Spleen: Unremarkable  Adrenals/Urinary Tract: Normal adrenal glands. Kidneys enhance symmetrically with contrast. No hydronephrosis. Urinary bladder is unremarkable.  Stomach/Bowel: Patient status post right hemicolectomy with ostomy in the right lower quadrant. Colon is fluid-filled. Multiple prominent mesenteric lymph nodes are demonstrated, many of which have slightly increased in size when compared to prior exam. Reference lymph node measures 10 mm (image 55; series 2), previously 5 mm. No dilated small bowel. No significant inflammatory change demonstrated within the right lower quadrant.  Vascular/Lymphatic: Normal caliber abdominal aorta. Multiple sub cm retroperitoneal lymph nodes  Other: Intrauterine device is present. Adnexal structures are unremarkable.  Musculoskeletal: No aggressive or acute appearing osseous lesions.  IMPRESSION: No evidence for bowel obstruction. No significant inflammatory change demonstrated within the right lower quadrant.  Multiple prominent mesenteric and retroperitoneal lymph nodes, increased from prior, are nonspecific however likely reactive in etiology given patient's history of Crohn's disease.   Electronically Signed   By: DrLovey Newcomer.D.   On: 08/29/2015 22:02    EKG: Independently reviewed.  Assessment/Plan Principal Problem:   Sepsis ([place] Problems:   Crohn's colitis ([place]  UTI (urinary tract infection)   1. Sepsis due to  UTI vs intra-abdominal infection - 1. CT abd showed reactive lymph nodes only 2. Will treat empirically with cipro/flagyl 3. Urine culture pending 4. 3L IVF in ED has already improved her tachycardia, will continue this. 5. Dilaudid PRN pain 2. Crohn's disease - s/p humira on Tuesday 1. Got solumedrol in ED, but dosent seem like a typical flare to the patient so will hold off on ordering further immunosuppressive meds in this already immunosuppressed patient with sepsis.   Code Status: Full Code  Family Communication: No family in room Disposition Plan: admit to obs   Time spent: 70 min  Graham Doukas M. [facility] Pager [phone] 7AM-7PM, please contact the day team taking care of the patient [email or web address] Password [email or web address] 08/30/2015, 1:09 AM

## 2015-08-30 NOTE — Progress Notes (Signed)
ANTIBIOTIC CONSULT NOTE - INITIAL  Pharmacy Consult for Cipro Indication: intra-abdominal infection  No Known Allergies  Labs:  Recent Labs  08/29/15 1910  WBC 12.9*  HGB 11.7*  PLT 348  CREATININE 1.04*   Medical History: Past Medical History  Diagnosis Date  . Crohn disease (HCC)   . Crohn's disease (HCC)   . Pancreatitis     Assessment/Plan:  29yo female w/ Crohn's dz c/o rectal bleeding and pain, concern for intra-abdominal infection, to begin IV ABX.  Will start Cipro 400mg  IV Q12H and monitor CBC and Cx.  Vernard Gambles, PharmD, BCPS  08/30/2015,12:55 AM

## 2015-08-30 NOTE — Discharge Summary (Signed)
Discharge Summary  Sara Patel GYI:[id number] DOB: 1985-12-23  PCP: No PCP Per Patient  Admit date: 08/29/2015 Discharge date: 08/30/2015  Time spent: <15mns  Recommendations for Outpatient Follow-up:  1. Patient previously has no pmd, patient is to f/u with [facility] for hospital discharge follow up, repeat cbc/bmp and f/u on final urine and blood culture at follow up appointment. 2. Continue f/u with [facility] as already scheduled  Discharge Diagnoses:  Active Hospital Problems   Diagnosis Date Noted  . [place] ([place] 08/29/[place]  . UTI (urinary tract infection) 08/30/2015  . Crohn's colitis ([facility] 08/29/2015    Resolved Hospital Problems   Diagnosis Date Noted Date Resolved  No resolved problems to display.    Discharge Condition: stable  Diet recommendation: soft/low fat diet  Filed Weights   08/30/15 0049  Weight: 169 lb 11.2 oz (76.975 kg)    History of present illness:  CSanika Brosiousis a 29y.o. female with h/o Crohn's disease, currently on Humira, abdominal surgeries including colectomy and ileostomy for same. Patient presents to the ED with c/o back pain radiating to the front onset 2 weeks ago. Has had associated fever with Tm of 100.5. Last humira dose was on Tues. She has nausea and bloody stool as well.  Hospital Course:  Principal Problem:   [place]is ([place] Active Problems:   Crohn's colitis ([facility]   UTI (urinary tract infection)  Sepsis in chronically immunosuppressed patient with mild leukocytosis/low garde fever 100.5, sinus tachycardia with uti vs intraabdominal infection with reactive lymph nodes on CT ab:  Much improved with cipro/flagyl, no fever, no pain, no n/v.tolerating regular diet. Wants to go home. Will discharge with oral cipro/flagyl, close outpatient follow up at mentioned above.  Yeast cystitis:diflucan 2081mpo x1 in the hospital, 10064mo qd x4 after discharge.  UTI vs intraabdominal  infection:CT ab only showed reactive lymph nodes, no other acute changed identified. Blood culture no growth so far, she responded to cipro/flagyl very well, will discharge on oral cipro/flagyl, close outpatient follow up.  Crohn's disease: on low dose humira [id number] and steroids enema, followed at [facility].  Sacroliitis: patient reported usually pain taking care by humira, she reported pain flares up with weather changes, prn naproxen.  Procedures:  CT ab/pel  Consultations:  none  Discharge Exam: BP 91/52 mmHg  Pulse 81  Temp(Src) 98.4 F (36.9 C) (Oral)  Resp 12  Ht 5' 4"  (1.626 m)  Wt 169 lb 11.2 oz (76.975 kg)  BMI 29.11 kg/m2  SpO2 91%  General: aaox3 Cardiovascular: RRR Respiratory: CTABL Ab: chronic ileostomy, no pus, no blood. No ab pain, + bs.  Discharge Instructions You were cared for by a hospitalist during your hospital stay. If you have any questions about your discharge medications or the care you received while you were in the hospital after you are discharged, you can call the unit and asked to speak with the hospitalist on call if the hospitalist that took care of you is not available. Once you are discharged, your primary care physician will handle any further medical issues. Please note that NO REFILLS for any discharge medications will be authorized once you are discharged, as it is imperative that you return to your primary care physician (or establish a relationship with a primary care physician if you do not have one) for your aftercare needs so that they can reassess your need for medications and monitor your lab values.  Discharge Instructions  Diet - low sodium heart healthy    Complete by:  As directed      Increase activity slowly    Complete by:  As directed             Medication List    TAKE these medications        acetaminophen 325 MG tablet  Commonly known as:  TYLENOL  Take 650 mg by mouth every 6 (six) hours as needed (pain).       ciprofloxacin 500 MG tablet  Commonly known as:  CIPRO  Take 1 tablet (500 mg total) by mouth 2 (two) times daily.     dicyclomine 10 MG capsule  Commonly known as:  BENTYL  Take 10 mg by mouth 4 (four) times daily.     ferrous gluconate 324 MG tablet  Commonly known as:  FERGON  Take 324 mg by mouth daily.     HUMIRA PEN 40 MG/0.8ML Pnkt  Generic drug:  Adalimumab  Inject 40 mg into the skin every 7 (seven) days.     hydrocortisone 100 MG/60ML enema  Commonly known as:  CORTENEMA  Place 1 enema rectally daily.     levonorgestrel 20 MCG/24HR IUD  Commonly known as:  MIRENA  1 each by Intrauterine route once.     metroNIDAZOLE 500 MG tablet  Commonly known as:  FLAGYL  Take 1 tablet (500 mg total) by mouth 3 (three) times daily.     naproxen 500 MG tablet  Commonly known as:  NAPROSYN  Take 1 tablet (500 mg total) by mouth 3 (three) times daily with meals as needed for mild pain or moderate pain.     ondansetron 4 MG tablet  Commonly known as:  ZOFRAN  Take 1 tablet (4 mg total) by mouth every 8 (eight) hours as needed for nausea.       Allergies  Allergen Reactions  . Iron Dextran Anaphylaxis       Follow-up Information    Follow up with [facility]     On 08/31/2015.   Why:  hospital discharge followup, repeat cbc/bmp at follow up, f/u on final urine culture    Contact information:   [address] [place] [address] [phone]       The results of significant diagnostics from this hospitalization (including imaging, microbiology, ancillary and laboratory) are listed below for reference.    Significant Diagnostic Studies: Ct Abdomen Pelvis W Contrast  08/29/2015   CLINICAL DATA:  Patient with right lower quadrant abdominal pain for multiple weeks. History of Crohn's. Ileostomy in place. Rectal bleeding 2 weeks.  EXAM: CT ABDOMEN AND PELVIS WITH CONTRAST  TECHNIQUE: Multidetector CT imaging of the  abdomen and pelvis was performed using the standard protocol following bolus administration of intravenous contrast.  CONTRAST:  27m OMNIPAQUE IOHEXOL 300 MG/ML SOLN, 1026mOMNIPAQUE IOHEXOL 300 MG/ML SOLN  COMPARISON:  CT abdomen pelvis 03/11/2013  FINDINGS: Lower chest: Dependent atelectasis within the left lower lobe. Normal heart size.  Hepatobiliary: Liver is normal in size and contour. No focal hepatic lesion identified. Gallbladder is unremarkable. No intrahepatic or extrahepatic biliary ductal dilatation.  Pancreas: Unremarkable  Spleen: Unremarkable  Adrenals/Urinary Tract: Normal adrenal glands. Kidneys enhance symmetrically with contrast. No hydronephrosis. Urinary bladder is unremarkable.  Stomach/Bowel: Patient status post right hemicolectomy with ostomy in the right lower quadrant. Colon is fluid-filled. Multiple prominent mesenteric lymph nodes are demonstrated, many of which have slightly increased in size when  compared to prior exam. Reference lymph node measures 10 mm (image 55; series 2), previously 5 mm. No dilated small bowel. No significant inflammatory change demonstrated within the right lower quadrant.  Vascular/Lymphatic: Normal caliber abdominal aorta. Multiple sub cm retroperitoneal lymph nodes  Other: Intrauterine device is present. Adnexal structures are unremarkable.  Musculoskeletal: No aggressive or acute appearing osseous lesions.  IMPRESSION: No evidence for bowel obstruction. No significant inflammatory change demonstrated within the right lower quadrant.  Multiple prominent mesenteric and retroperitoneal lymph nodes, increased from prior, are nonspecific however likely reactive in etiology given patient's history of Crohn's disease.   Electronically Signed   By: Lovey Newcomer M.D.   On: 08/29/2015 22:02    Microbiology: No results found for this or any previous visit (from the past 240 hour(s)).   Labs: Basic Metabolic Panel:  Recent Labs Lab 08/29/15 1910  NA 134*  K  3.6  CL 104  CO2 24  GLUCOSE 94  BUN 9  CREATININE 1.04*  CALCIUM 8.6*   Liver Function Tests:  Recent Labs Lab 08/29/15 1910  AST 19  ALT 22  ALKPHOS 66  BILITOT 0.2*  PROT 7.9  ALBUMIN 3.4*   No results for input(s): LIPASE, AMYLASE in the last 168 hours. No results for input(s): AMMONIA in the last 168 hours. CBC:  Recent Labs Lab 08/29/15 1910  WBC 12.9*  NEUTROABS 9.1*  HGB 11.7*  HCT 36.3  MCV 81.0  PLT 348   Cardiac Enzymes: No results for input(s): CKTOTAL, CKMB, CKMBINDEX, TROPONINI in the last 168 hours. BNP: BNP (last 3 results) No results for input(s): BNP in the last 8760 hours.  ProBNP (last 3 results) No results for input(s): PROBNP in the last 8760 hours.  CBG: No results for input(s): GLUCAP in the last 168 hours.     SignedFlorencia Reasons MD, PhD  [facility] 08/30/2015, 1:30 PM

## 2015-09-01 LAB — URINE CULTURE

## 2015-09-07 ENCOUNTER — Ambulatory Visit: Payer: 59 | Attending: Family Medicine | Admitting: Family Medicine

## 2015-09-07 ENCOUNTER — Encounter: Payer: Self-pay | Admitting: Family Medicine

## 2015-09-07 VITALS — BP 105/67 | HR 80 | Temp 98.9°F | Resp 16 | Ht 64.0 in | Wt 156.0 lb

## 2015-09-07 DIAGNOSIS — K5010 Crohn's disease of large intestine without complications: Secondary | ICD-10-CM | POA: Insufficient documentation

## 2015-09-07 DIAGNOSIS — Z79899 Other long term (current) drug therapy: Secondary | ICD-10-CM | POA: Insufficient documentation

## 2015-09-07 DIAGNOSIS — Z0000 Encounter for general adult medical examination without abnormal findings: Secondary | ICD-10-CM | POA: Diagnosis not present

## 2015-09-07 DIAGNOSIS — R319 Hematuria, unspecified: Secondary | ICD-10-CM

## 2015-09-07 DIAGNOSIS — M2550 Pain in unspecified joint: Secondary | ICD-10-CM | POA: Diagnosis not present

## 2015-09-07 DIAGNOSIS — N390 Urinary tract infection, site not specified: Secondary | ICD-10-CM

## 2015-09-07 DIAGNOSIS — K5090 Crohn's disease, unspecified, without complications: Secondary | ICD-10-CM | POA: Diagnosis present

## 2015-09-07 DIAGNOSIS — Z87440 Personal history of urinary (tract) infections: Secondary | ICD-10-CM | POA: Insufficient documentation

## 2015-09-07 DIAGNOSIS — Z932 Ileostomy status: Secondary | ICD-10-CM | POA: Insufficient documentation

## 2015-09-07 LAB — POCT URINALYSIS DIPSTICK
Bilirubin, UA: NEGATIVE
Glucose, UA: NEGATIVE
KETONES UA: NEGATIVE
Nitrite, UA: NEGATIVE
PH UA: 6
PROTEIN UA: NEGATIVE
SPEC GRAV UA: 1.015
UROBILINOGEN UA: 0.2

## 2015-09-07 LAB — BASIC METABOLIC PANEL
BUN: 6 mg/dL — ABNORMAL LOW (ref 7–25)
CALCIUM: 9.1 mg/dL (ref 8.6–10.2)
CO2: 26 mmol/L (ref 20–31)
Chloride: 104 mmol/L (ref 98–110)
Creat: 0.97 mg/dL (ref 0.50–1.10)
GLUCOSE: 75 mg/dL (ref 65–99)
Potassium: 4.3 mmol/L (ref 3.5–5.3)
SODIUM: 136 mmol/L (ref 135–146)

## 2015-09-07 LAB — CBC
HCT: 34.8 % — ABNORMAL LOW (ref 36.0–46.0)
HEMOGLOBIN: 11.2 g/dL — AB (ref 12.0–15.0)
MCH: 26.2 pg (ref 26.0–34.0)
MCHC: 32.2 g/dL (ref 30.0–36.0)
MCV: 81.5 fL (ref 78.0–100.0)
MPV: 10.5 fL (ref 8.6–12.4)
PLATELETS: 413 10*3/uL — AB (ref 150–400)
RBC: 4.27 MIL/uL (ref 3.87–5.11)
RDW: 16.9 % — AB (ref 11.5–15.5)
WBC: 10 10*3/uL (ref 4.0–10.5)

## 2015-09-07 MED ORDER — MELOXICAM 7.5 MG PO TABS: 7.5000 mg | ORAL_TABLET | Freq: Every day | ORAL | Status: DC

## 2015-09-07 MED ORDER — DICYCLOMINE HCL 20 MG PO TABS: 20.0000 mg | ORAL_TABLET | Freq: Three times a day (TID) | ORAL | Status: DC

## 2015-09-07 NOTE — Patient Instructions (Addendum)
Sara Patel was seen today for establish care and hospitalization follow-up.  Diagnoses and all orders for this visit:  Healthcare maintenance -     Flu Vaccine QUAD 36+ mos IM -     POCT urinalysis dipstick  Crohn's colitis, without complications (HCC) -     CBC -     Basic Metabolic Panel -     dicyclomine (BENTYL) 20 MG tablet; Take 1 tablet (20 mg total) by mouth 4 (four) times daily -  before meals and at bedtime.  Joint pain -     meloxicam (MOBIC) 7.5 MG tablet; Take 1 tablet (7.5 mg total) by mouth daily.   Call or send mychart message with symptom update in 3-4 weeks F/u in 8 weeks for Crohn's disease   Dr. Armen Pickup

## 2015-09-07 NOTE — Assessment & Plan Note (Signed)
A: major symptoms are epigastric cramping and joint pain. Followed by [facility] P: Increase bentyl dose to 20 mg QID mobic for joint pain Keep f/u with GI F/u with me in 8 weeks  Repeat CBC and BMP today

## 2015-09-07 NOTE — Progress Notes (Signed)
Patient ID: Sara Patel, female   DOB: [birth year], 29 y.o.   MRN: [id number]   Subjective:  Patient ID: Sara Patel, female    DOB: 10/16/1986  Age: 29 y.o. MRN: [id number]  CC: Establish Care and Hospitalization Follow-up   HPI Clothilde Tippetts presents for    1. HFU Crohn's disease: hospitalized from 10/8-10/07/2015 for Crohn's disease flare. Her main complaint was b/l hip pain. She admits to abdominal cramping and nausea. She denies emesis, blood in stool and joint swelling. She was recently stated on bentyl by her GI doctor for cramping. She denies improvement with bentyl. Naproxen for joint pain irritated her stomach.   2. UTI: treated with cipro. UCx w/o single organism. Denies dysuria, hematuria and frequency.   Past Medical History  Diagnosis Date  . Crohn disease (HCC) 2000    Diagnosed in 2000. [facility] at [facility]  . Pancreatitis     Past Surgical History  Procedure Laterality Date  . Abdominal surgery      2006, 2011 resections   . Ileostomy  2003  . Rectal abscess  2014     Family History  Problem Relation Age of Onset  . Hypertension Father     Social History  Substance Use Topics  . Smoking status: Never Smoker   . Smokeless tobacco: Not on file  . Alcohol Use: No    ROS Review of Systems  Constitutional: Negative for fever and chills.  Eyes: Negative for visual disturbance.  Respiratory: Negative for shortness of breath.   Cardiovascular: Negative for chest pain.  Gastrointestinal: Positive for nausea and abdominal pain (epigastric discomfort ). Negative for vomiting, diarrhea, constipation, blood in stool, abdominal distention, anal bleeding and rectal pain.  Genitourinary: Negative for dysuria, frequency and flank pain.  Musculoskeletal: Positive for arthralgias. Negative for back pain.  Skin: Negative for rash.  Allergic/Immunologic: Negative for immunocompromised state.  Hematological: Negative for adenopathy. Does not bruise/bleed easily.    Psychiatric/Behavioral: Negative for suicidal ideas and dysphoric mood.  GAD-7: score of 8. 1-4,7. 2-1,2,3   Objective:   Today's Vitals: BP 105/67 mmHg  Pulse 80  Temp(Src) 98.9 F (37.2 C) (Oral)  Resp 16  Ht  (1.626 m)  Wt 156 lb (70.761 kg)  BMI 26.76 kg/m2  SpO2 98%  Physical Exam  Constitutional: She is oriented to person, place, and time. She appears well-developed and well-nourished. No distress.  HENT:  Head: Normocephalic and atraumatic.  Cardiovascular: Normal rate, regular rhythm, normal heart sounds and intact distal pulses.   Pulmonary/Chest: Effort normal and breath sounds normal.  Abdominal: Soft. Bowel sounds are normal. She exhibits no distension and no mass. There is no tenderness. There is no rebound and no guarding.    Musculoskeletal: She exhibits no edema.  Neurological: She is alert and oriented to person, place, and time.  Skin: Skin is warm and dry. No rash noted.  Psychiatric: She has a normal mood and affect.   UA: moderate leuk Assessment & Plan:   UTI (urinary tract infection) Symptoms have resolved Patient completed cipro  No repeat Abx Patient advised to call if symptoms return   Crohn's colitis (HCC) A: major symptoms are epigastric cramping and joint pain. Followed by [facility] P: Increase bentyl dose to 20 mg QID mobic for joint pain Keep f/u with GI F/u with me in 8 weeks  Repeat CBC and BMP today     Outpatient Encounter Prescriptions as of 09/07/2015  Medication Sig  . acetaminophen (TYLENOL) 325 MG tablet  Take 650 mg by mouth every 6 (six) hours as needed (pain).  . Adalimumab (HUMIRA PEN) 40 MG/0.8ML PNKT Inject 40 mg into the skin every 7 (seven) days.  [name] dicyclomine (BENTYL) 10 MG capsule Take 10 mg by mouth 4 (four) times daily.  . ferrous gluconate (FERGON) 324 MG tablet Take 324 mg by mouth daily.  . hydrocortisone (CORTENEMA) 100 MG/60ML enema Place 1 enema rectally daily.  [name] levonorgestrel (MIRENA) 20 MCG/24HR  IUD 1 each by Intrauterine route once.  . ondansetron (ZOFRAN) 4 MG tablet Take 1 tablet (4 mg total) by mouth every 8 (eight) hours as needed for nausea.  . ciprofloxacin (CIPRO) 500 MG tablet Take 1 tablet (500 mg total) by mouth 2 (two) times daily. (Patient not taking: Reported on 09/07/2015)  . fluconazole (DIFLUCAN) 150 MG tablet Take 1 tablet (150 mg total) by mouth daily. (Patient not taking: Reported on 09/07/2015)  . metroNIDAZOLE (FLAGYL) 500 MG tablet Take 1 tablet (500 mg total) by mouth 3 (three) times daily. (Patient not taking: Reported on 09/07/2015)  . naproxen (NAPROSYN) 500 MG tablet Take 1 tablet (500 mg total) by mouth 3 (three) times daily with meals as needed for mild pain or moderate pain. (Patient not taking: Reported on 09/07/2015)   No facility-administered encounter medications on file as of 09/07/2015.    Follow-up: No Follow-up on file.    Dessa Phi MD

## 2015-09-07 NOTE — Assessment & Plan Note (Signed)
Symptoms have resolved Patient completed cipro  No repeat Abx Patient advised to call if symptoms return

## 2015-09-07 NOTE — Progress Notes (Signed)
HFU Crohn's disease, UTI Abdominal pain, Pain scale #4 No hx tobacco

## 2015-09-29 ENCOUNTER — Encounter: Payer: Self-pay | Admitting: Family Medicine

## 2015-09-29 ENCOUNTER — Ambulatory Visit: Payer: 59 | Attending: Family Medicine | Admitting: Family Medicine

## 2015-09-29 VITALS — BP 102/68 | HR 58 | Temp 98.8°F | Resp 16 | Ht 64.0 in | Wt 158.0 lb

## 2015-09-29 DIAGNOSIS — M533 Sacrococcygeal disorders, not elsewhere classified: Secondary | ICD-10-CM | POA: Diagnosis not present

## 2015-09-29 DIAGNOSIS — K5010 Crohn's disease of large intestine without complications: Secondary | ICD-10-CM

## 2015-09-29 DIAGNOSIS — L52 Erythema nodosum: Secondary | ICD-10-CM

## 2015-09-29 DIAGNOSIS — R109 Unspecified abdominal pain: Secondary | ICD-10-CM | POA: Diagnosis present

## 2015-09-29 LAB — POCT URINALYSIS DIPSTICK
BILIRUBIN UA: NEGATIVE
Blood, UA: NEGATIVE
GLUCOSE UA: NEGATIVE
KETONES UA: NEGATIVE
Nitrite, UA: NEGATIVE
Protein, UA: NEGATIVE
SPEC GRAV UA: 1.015
Urobilinogen, UA: 0.2
pH, UA: 5.5

## 2015-09-29 LAB — COMPLETE METABOLIC PANEL WITH GFR
ALBUMIN: 3.7 g/dL (ref 3.6–5.1)
ALT: 18 U/L (ref 6–29)
AST: 16 U/L (ref 10–30)
Alkaline Phosphatase: 72 U/L (ref 33–115)
BILIRUBIN TOTAL: 0.2 mg/dL (ref 0.2–1.2)
BUN: 9 mg/dL (ref 7–25)
CALCIUM: 9.5 mg/dL (ref 8.6–10.2)
CO2: 21 mmol/L (ref 20–31)
CREATININE: 0.86 mg/dL (ref 0.50–1.10)
Chloride: 108 mmol/L (ref 98–110)
GFR, Est Non African American: >89 mL/min (ref >=60)
Glucose, Bld: 83 mg/dL (ref 65–99)
Potassium: 4.7 mmol/L (ref 3.5–5.3)
Sodium: 138 mmol/L (ref 135–146)
TOTAL PROTEIN: 8 g/dL (ref 6.1–8.1)

## 2015-09-29 LAB — CBC
HEMATOCRIT: 33.4 % — AB (ref 36.0–46.0)
Hemoglobin: 10.5 g/dL — ABNORMAL LOW (ref 12.0–15.0)
MCH: 25.5 pg — ABNORMAL LOW (ref 26.0–34.0)
MCHC: 31.4 g/dL (ref 30.0–36.0)
MCV: 81.1 fL (ref 78.0–100.0)
MPV: 11.4 fL (ref 8.6–12.4)
Platelets: 388 10*3/uL (ref 150–400)
RBC: 4.12 MIL/uL (ref 3.87–5.11)
RDW: 15.8 % — AB (ref 11.5–15.5)
WBC: 6.4 10*3/uL (ref 4.0–10.5)

## 2015-09-29 LAB — LIPASE: Lipase: 83 U/L — ABNORMAL HIGH (ref 7–60)

## 2015-09-29 LAB — POCT URINE PREGNANCY: Preg Test, Ur: NEGATIVE

## 2015-09-29 MED ORDER — ACETAMINOPHEN-CODEINE #3 300-30 MG PO TABS: 1.0000 | ORAL_TABLET | Freq: Every evening | ORAL | Status: DC | PRN

## 2015-09-29 MED ORDER — HYDROCORTISONE 100 MG/60ML RE ENEM: 1.0000 | ENEMA | Freq: Every day | RECTAL | Status: DC

## 2015-09-29 NOTE — Progress Notes (Signed)
Patient ID: Sara Patel, female   DOB: September 13, 1986, 29 y.o.   MRN: [id number]   Subjective:  Patient ID: Sara Patel, female    DOB: January 06, 1986  Age: 29 y.o. MRN: [id number]  CC: Abdominal Pain and Back Pain   HPI Keva Wiers presents for    1. Abdominal pain: worsening over past week. Epigastric pain that radiates to back when she eats. Nausea. No emesis or fever. She has Crohn's. She has ileostomy. No blood in stool. She is compliant with all meds except hydrocortisone suppositories.   2. Back pain: this is chronic. At [facility] joints b/l.  Previous imaging has been unrevealing. Worse at end of day when laying in bed. She exercises 4-5 times per week. She works a sit down job. She takes mobic as needed but it is not helping. No trauma. No other areas of joint pain or swelling. She admits to rash with tender nodules on legs that start off red. She has some now that are no longer red, but slightly raised.   Social History  Substance Use Topics  . Smoking status: Never Smoker   . Smokeless tobacco: Not on file  . Alcohol Use: No    Outpatient Prescriptions Prior to Visit  Medication Sig Dispense Refill  . acetaminophen (TYLENOL) 325 MG tablet Take 650 mg by mouth every 6 (six) hours as needed (pain).    . Adalimumab (HUMIRA PEN) 40 MG/0.8ML PNKT Inject 40 mg into the skin every 7 (seven) days.    [name] dicyclomine (BENTYL) 20 MG tablet Take 1 tablet (20 mg total) by mouth 4 (four) times daily -  before meals and at bedtime. 120 tablet 2  . ferrous gluconate (FERGON) 324 MG tablet Take 324 mg by mouth daily.    [name] levonorgestrel (MIRENA) 20 MCG/24HR IUD 1 each by Intrauterine route once.    . meloxicam (MOBIC) 7.5 MG tablet Take 1 tablet (7.5 mg total) by mouth daily. 30 tablet 0  . ondansetron (ZOFRAN) 4 MG tablet Take 1 tablet (4 mg total) by mouth every 8 (eight) hours as needed for nausea. 20 tablet 0  . hydrocortisone (CORTENEMA) 100 MG/60ML enema Place 1 enema rectally daily.  0    No facility-administered medications prior to visit.    ROS Review of Systems  Constitutional: Negative for fever and chills.  Eyes: Negative for visual disturbance.  Respiratory: Negative for shortness of breath.   Cardiovascular: Negative for chest pain.  Gastrointestinal: Positive for nausea and abdominal pain (epigastric, radiates to upper back ). Negative for vomiting, diarrhea, constipation, blood in stool, abdominal distention, anal bleeding and rectal pain.  Genitourinary: Negative for dysuria, frequency and flank pain.  Musculoskeletal: Positive for arthralgias (hips ). Negative for back pain.  Skin: Positive for color change and rash.       On legs   Allergic/Immunologic: Negative for immunocompromised state.  Hematological: Negative for adenopathy. Does not bruise/bleed easily.  Psychiatric/Behavioral: Negative for suicidal ideas and dysphoric mood.    Objective:  BP 102/68 mmHg  Pulse 58  Temp(Src) 98.8 F (37.1 C) (Oral)  Resp 16  Ht  (1.626 m)  Wt 158 lb (71.668 kg)  BMI 27.11 kg/m2  SpO2 100%  BP/Weight 09/29/2015 09/07/2015 08/30/2015  Systolic BP 102 105 91  Diastolic BP 68 67 52  Wt. (Lbs) 158 156 169.7  BMI 27.11 26.76 29.11   Physical Exam  Constitutional: She is oriented to person, place, and time. She appears well-developed and well-nourished. No distress.  HENT:  Head: Normocephalic and atraumatic.  Cardiovascular: Normal rate, regular rhythm, normal heart sounds and intact distal pulses.   Pulmonary/Chest: Effort normal and breath sounds normal.  Abdominal: Soft. She exhibits distension and mass. There is tenderness. There is rebound and guarding.    Musculoskeletal: She exhibits no edema.       Lumbar back: She exhibits tenderness. She exhibits no bony tenderness.  Tenderness at SI joints exacerbated by extension of low back   Neurological: She is alert and oriented to person, place, and time.  Skin: Skin is warm and dry. No rash noted.      Psychiatric: She has a normal mood and affect.     Assessment & Plan:   Problem List Items Addressed This Visit    Abdominal pain - Primary    A: suspect Crohn's flare. Patient has hx of pancreatitis. No red flags or exam.  P: Labs per orders Pain medicine annusol supp       Relevant Medications   acetaminophen-codeine (TYLENOL #3) 300-30 MG tablet   Other Relevant Orders   POCT urinalysis dipstick (Completed)   POCT urine pregnancy (Completed)   COMPLETE METABOLIC PANEL WITH GFR   Lipase   CBC   Crohn's colitis (HCC) (Chronic)   Relevant Medications   hydrocortisone (CORTENEMA) 100 MG/60ML enema   Erythema nodosum   SI (sacroiliac) pain (Chronic)    A: SI pain in setting of Crohn's P: Pain medicine Prn NSAID Continue exercise       Relevant Medications   acetaminophen-codeine (TYLENOL #3) 300-30 MG tablet      No orders of the defined types were placed in this encounter.    Follow-up: No Follow-up on file.   Dessa Phi MD

## 2015-09-29 NOTE — Patient Instructions (Addendum)
Sara Patel was seen today for abdominal pain and back pain.  Diagnoses and all orders for this visit:  Abdominal pain, unspecified abdominal location -     POCT urinalysis dipstick -     POCT urine pregnancy -     COMPLETE METABOLIC PANEL WITH GFR -     Lipase -     acetaminophen-codeine (TYLENOL #3) 300-30 MG tablet; Take 1-2 tablets by mouth at bedtime as needed for moderate pain. -     CBC  SI (sacroiliac) pain -     acetaminophen-codeine (TYLENOL #3) 300-30 MG tablet; Take 1-2 tablets by mouth at bedtime as needed for moderate pain.  Crohn's colitis, without complications (HCC) -     hydrocortisone (CORTENEMA) 100 MG/60ML enema; Place 1 enema (100 mg total) rectally at bedtime.   F/u in 1 weeks if symptoms worsen or fail to improve, otherwise f/u in 6 weeks for Crohn's   Dr. Armen Pickup   Erythema Nodosum Erythema nodosum is a skin condition in which patches of fat under the skin of the lower legs become inflamed. This causes painful bumps (nodules) to form. CAUSES Common causes of this condition include:  Infections.  Certain medicines, especially birth control pills, penicillin, and sulfa medicines. Other causes include:  Pregnancy.  Certain inflammatory conditions, including Lupus, Crohn's disease, and thyroid conditions. In some cases, the cause may not be known. RISK FACTORS This condition is more likely to develop in young adult women. SYMPTOMS The main symptom of this condition is large nodules that look like raised bruises and are tender to the touch. These nodules usually appear on the shins, but they may also appear on the arms or the trunk. They gradually change in color from pink to brown, and they leave a dark mark that clears up in several months. Other symptoms include:  Fever.  Fatigue.  Joint pain.  Itchiness. DIAGNOSIS This condition is diagnosed based on symptoms. To find the underlying condition that caused the erythema nodosum, your health care  provider may also do a physical exam, X-rays, and blood tests. TREATMENT Treatment for this condition depends on the cause. The nodules usually go away with treatment of the underlying condition. Any pain or discomfort may be treated with:  Anti-inflammatory medicines.  Bed rest.  Raising (elevating) the affected area.  Sacroiliac Joint Dysfunction Sacroiliac joint dysfunction is a condition that causes inflammation on one or both sides of the sacroiliac (SI) joint. The SI joint connects the lower part of the spine (sacrum) with the two upper portions of the pelvis (ilium). This condition causes deep aching or burning pain in the low back. In some cases, the pain may also spread into one or both buttocks or hips or spread down the legs. CAUSES This condition may be caused by:  Pregnancy. During pregnancy, extra stress is put on the SI joints because the pelvis widens.  Injury, such as:  Car accidents.  Sport-related injuries.  Work-related injuries.  Having one leg that is shorter than the other.  Conditions that affect the joints, such as:  Rheumatoid arthritis.  Gout.  Psoriatic arthritis.  Joint infection (septic arthritis). Sometimes, the cause of SI joint dysfunction is not known. SYMPTOMS Symptoms of this condition include:  Aching or burning pain in the lower back. The pain may also spread to other areas, such as:  Buttocks.  Groin.  Thighs and legs.  Muscle spasms in or around the painful areas.  Increased pain when standing, walking, running, stair climbing, bending, or  lifting. DIAGNOSIS Your health care provider will do a physical exam and take your medical history. During the exam, the health care provider may move one or both of your legs to different positions to check for pain. Various tests may be done to help verify the diagnosis, including:  Imaging tests to look for other causes of pain. These may include:  MRI.  CT scan.  Bone  scan.  Diagnostic injection. A numbing medicine is injected into the SI joint using a needle. If the pain is temporarily improved or stopped after the injection, this can indicate that SI joint dysfunction is the problem. TREATMENT Treatment may vary depending on the cause and severity of your condition. Treatment options may include:  Applying ice or heat to the lower back area. This can help to reduce pain and muscle spasms.  Medicines to relieve pain or inflammation or to relax the muscles.  Wearing a back brace (sacroiliac brace) to help support the joint while your back is healing.  Physical therapy to increase muscle strength around the joint and flexibility at the joint. This may also involve learning proper body positions and ways of moving to relieve stress on the joint.  Direct manipulation of the SI joint.  Injections of steroid medicine into the joint in order to reduce pain and swelling.  Radiofrequency ablation to burn away nerves that are carrying pain messages from the joint.  Use of a device that provides electrical stimulation in order to reduce pain at the joint.  Surgery to put in screws and plates that limit or prevent joint motion. This is rare. HOME CARE INSTRUCTIONS  Rest as needed. Limit your activities as directed by your health care provider.  Take medicines only as directed by your health care provider.  If directed, apply ice to the affected area:  Put ice in a plastic bag.  Place a towel between your skin and the bag.  Leave the ice on for 20 minutes, 2-3 times per day.  Use a heating pad or a moist heat pack as directed by your health care provider.  Exercise as directed by your health care provider or physical therapist.  Keep all follow-up visits as directed by your health care provider. This is important. SEEK MEDICAL CARE IF:  Your pain is not controlled with medicine.  You have a fever.  You have increasingly severe pain. SEEK  IMMEDIATE MEDICAL CARE IF:  You have weakness, numbness, or tingling in your legs or feet.  You lose control of your bladder or bowel.   This information is not intended to replace advice given to you by your health care provider. Make sure you discuss any questions you have with your health care provider.   Document Released: 02/03/2009 Document Revised: 03/24/2015 Document Reviewed: 07/15/2014 Elsevier Interactive Patient Education 2016 [employer].   Cool compresses. In some cases, steroids and potassium iodide tablets may be given. HOME CARE INSTRUCTIONS  Take medicines only as directed by your health care provider.  Stay in bed for as long as directed by your health care provider.  Until your symptoms go away, limit any exercising that makes you breathe harder and faster (vigorous).  Elevate the affected leg as directed by your health care provider.  Apply cool compresses to the affected area as directed by your health care provider. SEEK MEDICAL CARE IF:  Your symptoms are not improving.  You have a fever that does not go away. SEEK IMMEDIATE MEDICAL CARE IF:  Your condition  gets worse.  Your pain gets worse.  You have a sore throat.  You vomit repeatedly.   This information is not intended to replace advice given to you by your health care provider. Make sure you discuss any questions you have with your health care provider.   Document Released: 12/15/2004 Document Revised: 03/24/2015 Document Reviewed: 10/15/2014 Elsevier Interactive Patient Education [employer]

## 2015-09-29 NOTE — Progress Notes (Signed)
C/C abdominal and back pain X 2 weeks  Pain scale #4 No tobacco user

## 2015-09-29 NOTE — Addendum Note (Signed)
Addended by: Dessa Phi on: 09/29/2015 03:27 PM   Modules accepted: Orders

## 2015-09-29 NOTE — Assessment & Plan Note (Signed)
A: SI pain in setting of Crohn's P: Pain medicine Prn NSAID Continue exercise

## 2015-09-29 NOTE — Assessment & Plan Note (Signed)
A: suspect Crohn's flare. Patient has hx of pancreatitis. No red flags or exam.  P: Labs per orders Pain medicine annusol supp

## 2015-09-30 ENCOUNTER — Telehealth: Payer: Self-pay | Admitting: Family Medicine

## 2015-09-30 DIAGNOSIS — K859 Acute pancreatitis, unspecified: Secondary | ICD-10-CM

## 2015-09-30 DIAGNOSIS — Z8719 Personal history of other diseases of the digestive system: Secondary | ICD-10-CM | POA: Insufficient documentation

## 2015-09-30 NOTE — Telephone Encounter (Signed)
Date of birth verified by  Lab results given  CMP normal  Lipase elevated at 83 Slight dip in Hgb Bowel rest with clear liquid diet- Gatorade ginger ale water broth  If Sx fever, chills vomiting go to ED Repeat lipase on 10/01/2015  Pt verbalize understanding

## 2015-09-30 NOTE — Telephone Encounter (Signed)
Called patient Request a call back. When she calls please give her the following info  CMP normal Lipase in elevated at 83 which is an increase from 27 last month Slight dip in Hgb   Recommend rest Bowel rest with clear liquid diet (jello, gatorade, ginger ale, water, broth)  Repeat lipase on 10/01/15 to trend (ordered)  Go to ED for fever, chills, vomiting

## 2015-09-30 NOTE — Telephone Encounter (Signed)
Patient returned phone call, please f/u with pt.

## 2015-12-22 ENCOUNTER — Other Ambulatory Visit: Payer: Self-pay | Admitting: *Deleted

## 2015-12-22 DIAGNOSIS — K5010 Crohn's disease of large intestine without complications: Secondary | ICD-10-CM

## 2015-12-22 MED ORDER — DICYCLOMINE HCL 20 MG PO TABS: 20.0000 mg | ORAL_TABLET | Freq: Three times a day (TID) | ORAL | Status: DC

## 2016-01-18 ENCOUNTER — Encounter: Payer: Self-pay | Admitting: Clinical

## 2016-01-18 ENCOUNTER — Ambulatory Visit: Payer: 59 | Attending: Family Medicine | Admitting: Family Medicine

## 2016-01-18 ENCOUNTER — Encounter: Payer: Self-pay | Admitting: Family Medicine

## 2016-01-18 VITALS — BP 109/69 | HR 73 | Temp 98.5°F | Resp 16 | Ht 64.0 in | Wt 154.0 lb

## 2016-01-18 DIAGNOSIS — Z79899 Other long term (current) drug therapy: Secondary | ICD-10-CM | POA: Insufficient documentation

## 2016-01-18 DIAGNOSIS — M533 Sacrococcygeal disorders, not elsewhere classified: Secondary | ICD-10-CM | POA: Insufficient documentation

## 2016-01-18 DIAGNOSIS — N390 Urinary tract infection, site not specified: Secondary | ICD-10-CM | POA: Diagnosis not present

## 2016-01-18 DIAGNOSIS — Z0000 Encounter for general adult medical examination without abnormal findings: Secondary | ICD-10-CM

## 2016-01-18 DIAGNOSIS — K5010 Crohn's disease of large intestine without complications: Secondary | ICD-10-CM | POA: Diagnosis present

## 2016-01-18 DIAGNOSIS — R52 Pain, unspecified: Secondary | ICD-10-CM | POA: Insufficient documentation

## 2016-01-18 DIAGNOSIS — L52 Erythema nodosum: Secondary | ICD-10-CM | POA: Insufficient documentation

## 2016-01-18 DIAGNOSIS — Z114 Encounter for screening for human immunodeficiency virus [HIV]: Secondary | ICD-10-CM | POA: Diagnosis not present

## 2016-01-18 DIAGNOSIS — Z932 Ileostomy status: Secondary | ICD-10-CM | POA: Diagnosis not present

## 2016-01-18 DIAGNOSIS — B3741 Candidal cystitis and urethritis: Secondary | ICD-10-CM

## 2016-01-18 DIAGNOSIS — Z8719 Personal history of other diseases of the digestive system: Secondary | ICD-10-CM

## 2016-01-18 LAB — C-REACTIVE PROTEIN: CRP: 1.1 mg/dL — AB (ref <0.60)

## 2016-01-18 LAB — POCT GLYCOSYLATED HEMOGLOBIN (HGB A1C): Hemoglobin A1C: 5.7

## 2016-01-18 LAB — HIV ANTIBODY (ROUTINE TESTING W REFLEX): HIV: NONREACTIVE

## 2016-01-18 NOTE — Progress Notes (Signed)
Depression screen [facility] 2/9 01/18/2016 01/18/2016 09/29/2015 09/07/2015  Decreased Interest 0 0 0 0  Down, Depressed, Hopeless 0 3 0 1  PHQ - 2 Score 0 3 0 1  Altered sleeping 1 1 - -  Tired, decreased energy 1 1 - -  Change in appetite 0 0 - -  Feeling bad or failure about yourself  0 0 - -  Trouble concentrating 0 0 - -  Moving slowly or fidgety/restless 0 0 - -  Suicidal thoughts 0 0 - -  PHQ-9 Score 2 5 - -   **Note, PHQ9 score of 5 on 01-18-16 is incorrect, Patient states "not at all" for "feeling down, depressed, or hopeless"; correct total score is 2   GAD 7 : Generalized Anxiety Score 01/18/2016  Nervous, Anxious, on Edge 0  Control/stop worrying 0  Worry too much - different things 0  Trouble relaxing 0  Restless 0  Easily annoyed or irritable 0  Afraid - awful might happen 0  Total GAD 7 Score 0

## 2016-01-18 NOTE — Progress Notes (Signed)
Subjective:  Patient ID: Sara Patel, female    DOB: [birth year]  Age: 30 y.o. MRN: [id number]  CC: Generalized Body Aches; Crohn's Disease; and Urinary Tract Infection   HPI Lusine Huston presents has Crohn's disease:   1. Body aches: she has chronic body aches in gluteal area and in knees. She has no joint swelling. 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 She is compliant with humira. She denies fever, chills, unintentional weight loss. She reports that her Crohn's is fairly well controlled but she thinks it could be better. She request local rheumatology and GI. She has been with her current GI since age 34 but would like a local provider.   2. Frequent UTI:  She reports 3 UTIs in last years. Denies dysuria, hematuria, fever, chills, flank pain. She reports having to change her body position to fully empty her bladder.  Social History  Substance Use Topics  . Smoking status: Never Smoker   . Smokeless tobacco: Not on file  . Alcohol Use: No    Outpatient Prescriptions Prior to Visit  Medication Sig Dispense Refill  . acetaminophen (TYLENOL) 325 MG tablet Take 650 mg by mouth every 6 (six) hours as needed (pain).    [name] acetaminophen-codeine (TYLENOL #3) 300-30 MG tablet Take 1-2 tablets by mouth at bedtime as needed for moderate pain. 30 tablet 0  . Adalimumab (HUMIRA PEN) 40 MG/0.8ML PNKT Inject 40 mg into the skin every 7 (seven) days.    [name] dicyclomine (BENTYL) 20 MG tablet Take 1 tablet (20 mg total) by mouth 4 (four) times daily -  before meals and at bedtime. 120 tablet 2  . ferrous gluconate (FERGON) 324 MG tablet Take 324 mg by mouth daily.    . hydrocortisone (CORTENEMA) 100 MG/60ML enema Place 1 enema (100 mg total) rectally at bedtime. 420 mL 1  . levonorgestrel (MIRENA) 20 MCG/24HR IUD 1 each by Intrauterine route once.    . meloxicam (MOBIC) 7.5 MG tablet Take 1 tablet (7.5 mg total) by mouth daily. 30 tablet 0  . ondansetron (ZOFRAN) 4 MG tablet Take 1  tablet (4 mg total) by mouth every 8 (eight) hours as needed for nausea. 20 tablet 0   No facility-administered medications prior to visit.    ROS Review of Systems  Constitutional: Negative for fever and chills.  Eyes: Negative for visual disturbance.  Respiratory: Negative for shortness of breath.   Cardiovascular: Negative for chest pain.  Gastrointestinal: Negative for nausea, vomiting, abdominal pain, diarrhea, constipation, blood in stool, abdominal distention, anal bleeding and rectal pain.  Genitourinary: Negative for dysuria, frequency and flank pain.  Musculoskeletal: Positive for arthralgias (hips ). Negative for back pain.  Skin: Positive for color change and rash.       On legs   Allergic/Immunologic: Negative for immunocompromised state.  Hematological: Negative for adenopathy. Does not bruise/bleed easily.  Psychiatric/Behavioral: Negative for suicidal ideas and dysphoric mood.    Objective:  BP 109/69 mmHg  Pulse 73  Temp(Src) 98.5 F (36.9 C) (Oral)  Resp 16  Ht 5\' 4"  (1.626 m)  Wt 154 lb (69.854 kg)  BMI 26.42 kg/m2  SpO2 99%  BP/Weight 01/18/2016 09/29/2015 09/07/2015  Systolic BP 109 102 105  Diastolic BP 69 68 67  Wt. (Lbs) 154 158 156  BMI 26.42 27.11 26.76   Physical Exam  Constitutional: She is oriented to person, place, and time. She appears well-developed and well-nourished. No distress.  HENT:  Head: Normocephalic and atraumatic.  Cardiovascular:  Normal rate, regular rhythm, normal heart sounds and intact distal pulses.   Pulmonary/Chest: Effort normal and breath sounds normal.  Abdominal: Soft.    Musculoskeletal: She exhibits no edema.       Lumbar back: She exhibits tenderness. She exhibits no bony tenderness.  Tenderness at SI joints exacerbated by extension of low back   Neurological: She is alert and oriented to person, place, and time.  Skin: Skin is warm and dry. No rash noted.     Psychiatric: She has a normal mood and affect.    Lab Results  Component Value Date   HGBA1C 5.7 01/18/2016   Assessment & Plan:  Anelle was seen today for generalized body aches, crohn's disease and urinary tract infection.  Diagnoses and all orders for this visit:  Healthcare maintenance -     HgB A1c  SI (sacroiliac) pain -     Sedimentation Rate -     C-reactive protein -     MR Pelvis W Wo Contrast; Future -     Ambulatory referral to Rheumatology  Frequent UTI -     Urine culture -     US Renal; Future  Screening for HIV (human immunodeficiency virus) -     HIV antibody (with reflex)  Crohn's colitis, without complications (HCC) -     Ambulatory referral to Gastroenterology  S/P ileostomy (HCC) -     Ambulatory referral to Gastroenterology  Erythema nodosum -     Ambulatory referral to Rheumatology    No orders of the defined types were placed in this encounter.    Follow-up: No Follow-up on file.   Dessa Phi MD

## 2016-01-18 NOTE — Assessment & Plan Note (Signed)
A; reported UTI x 2 with feeling of incomplete bladder emptying. She is immunocompromised while on Humira P: Renal US Urine culture Consider urology referral prn documented UTIs or anatomical abnormality on renal US for urodynamic testing

## 2016-01-18 NOTE — Assessment & Plan Note (Signed)
A: patient with Crohn's colitis, requesting local GI. Feel likes Crhon's could be better controlled P:  GI referral to [facility]

## 2016-01-18 NOTE — Patient Instructions (Addendum)
Diagnoses and all orders for this visit:  Healthcare maintenance -     HgB A1c  SI (sacroiliac) pain -     Sedimentation Rate -     C-reactive protein -     MR Pelvis W Wo Contrast; Future -     Ambulatory referral to Rheumatology  Frequent UTI -     Urine culture -     US Renal; Future  Screening for HIV (human immunodeficiency virus) -     HIV antibody (with reflex)  Crohn's colitis, without complications (HCC) -     Ambulatory referral to Gastroenterology  S/P ileostomy (HCC) -     Ambulatory referral to Gastroenterology  Erythema nodosum -     Ambulatory referral to Rheumatology    You will be contacted with pelvic MRI, renal ultrasound, lab and urine culture results.  You will be called with GI and Rheumatology appt.  F/u in 8 weeks  Dr. Armen Pickup

## 2016-01-18 NOTE — Progress Notes (Signed)
C/C Body ache x 3 weeks no Hx injury  Pain scale # 3 No tobacco user  No suicidal thoughts in the past two weeks

## 2016-01-18 NOTE — Assessment & Plan Note (Signed)
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

## 2016-01-19 LAB — SEDIMENTATION RATE

## 2016-01-21 ENCOUNTER — Telehealth: Payer: Self-pay | Admitting: Family Medicine

## 2016-01-21 ENCOUNTER — Encounter: Payer: Self-pay | Admitting: Family Medicine

## 2016-01-21 LAB — URINE CULTURE

## 2016-01-21 MED ORDER — FLUCONAZOLE 200 MG PO TABS: 200.0000 mg | ORAL_TABLET | Freq: Every day | ORAL | Status: DC

## 2016-01-21 NOTE — Addendum Note (Signed)
Addended by: Dessa Phi on: 01/21/2016 08:53 AM   Modules accepted: Orders, SmartSet

## 2016-01-21 NOTE — Telephone Encounter (Signed)
error 

## 2016-01-21 NOTE — Telephone Encounter (Signed)
Opened in error

## 2016-02-03 ENCOUNTER — Ambulatory Visit (HOSPITAL_COMMUNITY)
Admission: RE | Admit: 2016-02-03 | Discharge: 2016-02-03 | Disposition: A | Discharge status: Home / Self Care | Payer: 59 | Source: Ambulatory Visit | Attending: Family Medicine | Admitting: Family Medicine

## 2016-02-03 DIAGNOSIS — N390 Urinary tract infection, site not specified: Secondary | ICD-10-CM | POA: Diagnosis not present

## 2016-02-03 DIAGNOSIS — K5090 Crohn's disease, unspecified, without complications: Secondary | ICD-10-CM | POA: Diagnosis not present

## 2016-02-03 DIAGNOSIS — M533 Sacrococcygeal disorders, not elsewhere classified: Secondary | ICD-10-CM | POA: Insufficient documentation

## 2016-02-03 DIAGNOSIS — R93421 Abnormal radiologic findings on diagnostic imaging of right kidney: Secondary | ICD-10-CM | POA: Insufficient documentation

## 2016-02-03 MED ORDER — GADOBENATE DIMEGLUMINE 529 MG/ML IV SOLN
14.0000 mL | Freq: Once | INTRAVENOUS | Status: AC | PRN
  Administered 2016-02-03: 14 mL via INTRAVENOUS

## 2016-02-05 ENCOUNTER — Telehealth: Payer: Self-pay | Admitting: Family Medicine

## 2016-02-05 DIAGNOSIS — N2889 Other specified disorders of kidney and ureter: Secondary | ICD-10-CM | POA: Insufficient documentation

## 2016-02-05 NOTE — Telephone Encounter (Signed)
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 Recommend further assessment with CT without and with IV contrast or preferably MRI to evaluate further.  Informed patient that MRI ordered, will be scheduled. Patient agrees with plan and voices understanding She prefers AM appts.   Francena Hanly, please schedule MRI and call patient with appt details

## 2016-02-05 NOTE — Telephone Encounter (Signed)
Pt notified MRI on 02/17/2016 at 8:00am arriving 15 min early  NPO 4 hr prior to test

## 2016-02-17 ENCOUNTER — Ambulatory Visit (HOSPITAL_COMMUNITY)
Admission: RE | Admit: 2016-02-17 | Discharge: 2016-02-17 | Disposition: A | Discharge status: Home / Self Care | Payer: 59 | Source: Ambulatory Visit | Attending: Family Medicine | Admitting: Family Medicine

## 2016-02-17 DIAGNOSIS — N281 Cyst of kidney, acquired: Secondary | ICD-10-CM | POA: Insufficient documentation

## 2016-02-17 DIAGNOSIS — N2889 Other specified disorders of kidney and ureter: Secondary | ICD-10-CM | POA: Insufficient documentation

## 2016-02-17 MED ORDER — GADOBENATE DIMEGLUMINE 529 MG/ML IV SOLN
15.0000 mL | Freq: Once | INTRAVENOUS | Status: AC
  Administered 2016-02-17: 15 mL via INTRAVENOUS

## 2016-12-23 ENCOUNTER — Other Ambulatory Visit: Payer: Self-pay | Admitting: Obstetrics and Gynecology

## 2017-02-03 ENCOUNTER — Encounter (HOSPITAL_COMMUNITY): Payer: Self-pay | Admitting: Emergency Medicine

## 2017-02-03 ENCOUNTER — Emergency Department (HOSPITAL_COMMUNITY)
Admission: EM | Admit: 2017-02-03 | Discharge: 2017-02-04 | Disposition: A | Discharge status: Hospice | Payer: 59 | Attending: Emergency Medicine | Admitting: Emergency Medicine

## 2017-02-03 DIAGNOSIS — Z79899 Other long term (current) drug therapy: Secondary | ICD-10-CM | POA: Diagnosis not present

## 2017-02-03 DIAGNOSIS — K50114 Crohn's disease of large intestine with abscess: Secondary | ICD-10-CM | POA: Diagnosis not present

## 2017-02-03 DIAGNOSIS — R112 Nausea with vomiting, unspecified: Secondary | ICD-10-CM | POA: Diagnosis present

## 2017-02-03 DIAGNOSIS — A419 Sepsis, unspecified organism: Secondary | ICD-10-CM

## 2017-02-03 DIAGNOSIS — R1030 Lower abdominal pain, unspecified: Secondary | ICD-10-CM | POA: Insufficient documentation

## 2017-02-03 DIAGNOSIS — D500 Iron deficiency anemia secondary to blood loss (chronic): Secondary | ICD-10-CM | POA: Insufficient documentation

## 2017-02-03 DIAGNOSIS — D62 Acute posthemorrhagic anemia: Secondary | ICD-10-CM

## 2017-02-03 LAB — URINALYSIS, ROUTINE W REFLEX MICROSCOPIC
BILIRUBIN URINE: NEGATIVE
Glucose, UA: NEGATIVE mg/dL
KETONES UR: NEGATIVE mg/dL
NITRITE: NEGATIVE
PROTEIN: NEGATIVE mg/dL
Specific Gravity, Urine: 1.003 — ABNORMAL LOW (ref 1.005–1.030)
pH: 6 (ref 5.0–8.0)

## 2017-02-03 MED ORDER — OXYCODONE-ACETAMINOPHEN 5-325 MG PO TABS
1.0000 | ORAL_TABLET | Freq: Once | ORAL | Status: AC
  Administered 2017-02-04: 1 via ORAL
  Filled 2017-02-03: qty 1

## 2017-02-03 MED ORDER — ONDANSETRON HCL 4 MG/2ML IJ SOLN
4.0000 mg | Freq: Once | INTRAMUSCULAR | Status: AC
  Administered 2017-02-04: 4 mg via INTRAVENOUS
  Filled 2017-02-03: qty 2

## 2017-02-03 MED ORDER — FENTANYL CITRATE (PF) 100 MCG/2ML IJ SOLN
50.0000 ug | Freq: Once | INTRAMUSCULAR | Status: AC
  Administered 2017-02-04: 50 ug via INTRAVENOUS
  Filled 2017-02-03: qty 2

## 2017-02-03 MED ORDER — SODIUM CHLORIDE 0.9 % IV BOLUS (SEPSIS)
1000.0000 mL | Freq: Once | INTRAVENOUS | Status: AC
  Administered 2017-02-04: 1000 mL via INTRAVENOUS

## 2017-02-03 NOTE — ED Provider Notes (Signed)
[facility] DEPT Provider Note   CSN: [id number] Arrival date & time: 02/03/17  2240   By signing my name below, I, Delton Prairie, attest that this documentation has been prepared under the direction and in the presence of Varney Biles, MD  Electronically Signed: Delton Prairie, ED Scribe. 02/03/17. 12:01 AM.   History   Chief Complaint Chief Complaint  Patient presents with  . Nausea  . Emesis   The history is provided by the patient. No language interpreter was used.   HPI Comments:  Sara Patel is a 31 y.o. female, with a PMHx of Crohn's disease, who presents to the Emergency Department, via EMS, complaining of acute onset, persistent nausea onset 4 days. Pt also reports vomiting (once today), poor appetite, abdominal pain and dizziness onset today. Her dizziness is worse with standing up. She notes she had a partial colectomy and part of her rectum removed on 01/25/2017 due to Crohn's disease. She has taken Zofran with no relief. Pt denies diarrhea, fevers, new secretions in her ileostomy bag or any other associated symptoms. No other complaints noted.   Past Medical History:  Diagnosis Date  . Crohn disease ([place]) 2000   Diagnosed in 2000. GI at [facility]  . Pancreatitis     Patient Active Problem List   Diagnosis Date Noted  . Right kidney mass 02/05/2016  . Frequent UTI 01/18/2016  . History of pancreatitis 09/30/2015  . SI (sacroiliac) pain 09/29/2015  . 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 S/P ileostomy ([place]) 09/07/2015  . Crohn's colitis ([place]) 08/29/2015    Past Surgical History:  Procedure Laterality Date  . ABDOMINAL SURGERY     2006, 2011 resections   . ILEOSTOMY  2003  . rectal abscess  2014     OB History    No data available       Home Medications    Prior to Admission medications   Medication Sig Start Date End Date Taking? Authorizing Provider  acetaminophen (TYLENOL) 325 MG tablet Take 650 mg by mouth every 6 (six) hours as needed (pain).   Yes  Historical Provider, MD  Adalimumab (HUMIRA PEN) 40 MG/0.8ML PNKT Inject 40 mg into the skin every 7 (seven) days. 08/10/15  Yes Historical Provider, MD  ondansetron (ZOFRAN) 4 MG tablet Take 1 tablet (4 mg total) by mouth every 8 (eight) hours as needed for nausea. 03/14/13  Yes Janece Canterbury, MD  Oxycodone HCl 10 MG TABS Take 10 mg by mouth every 4 (four) hours as needed for pain. 01/30/17  Yes Historical Provider, MD  acetaminophen-codeine (TYLENOL #3) 300-30 MG tablet Take 1-2 tablets by mouth at bedtime as needed for moderate pain. Patient not taking: Reported on 02/04/2017 09/29/15   Boykin Nearing, MD  dicyclomine (BENTYL) 20 MG tablet Take 1 tablet (20 mg total) by mouth 4 (four) times daily -  before meals and at bedtime. Patient not taking: Reported on 02/04/2017 12/22/15   Boykin Nearing, MD  ferrous gluconate (FERGON) 324 MG tablet Take 324 mg by mouth daily. 08/04/15 08/03/16  Historical Provider, MD  fluconazole (DIFLUCAN) 200 MG tablet Take 1 tablet (200 mg total) by mouth daily. For two weeks Patient not taking: Reported on 02/04/2017 01/21/16   Boykin Nearing, MD  hydrocortisone (CORTENEMA) 100 MG/60ML enema Place 1 enema (100 mg total) rectally at bedtime. Patient not taking: Reported on 02/04/2017 09/29/15   Boykin Nearing, MD  meloxicam (MOBIC) 7.5 MG tablet Take 1 tablet (7.5 mg total) by mouth daily. Patient not taking: Reported  on 02/04/2017 09/07/15   Boykin Nearing, MD    Family History Family History  Problem Relation Age of Onset  . Hypertension Father     Social History Social History  Substance Use Topics  . Smoking status: Never Smoker  . Smokeless tobacco: Never Used  . Alcohol use No     Allergies   Iron dextran   Review of Systems Review of Systems 10 systems reviewed and all are negative for acute change except as noted in the HPI.  Physical Exam Updated Vital Signs BP 116/65 (BP Location: Left Arm)   Pulse 94   Temp 98.9 F (37.2 C) (Oral)    Resp 18   Ht 5' 4"  (1.626 m)   Wt 160 lb (72.6 kg)   LMP 01/29/2017   SpO2 95%   BMI 27.46 kg/m   Physical Exam  Constitutional: She is oriented to person, place, and time. She appears well-developed and well-nourished. No distress.  HENT:  Head: Normocephalic and atraumatic.  Eyes: EOM are normal.  No nystagmus   Neck: Normal range of motion.  Cardiovascular: Normal rate, regular rhythm and normal heart sounds.   Pulmonary/Chest: Effort normal and breath sounds normal.  Abdominal: Soft. She exhibits no distension. There is generalized tenderness.  Generalized lower quadrant abdominal tenderness. Right sided ileostomy bag in place with no surrounding erythema. Midline surgical incision wound appears without active drainage.   Musculoskeletal: Normal range of motion.  Neurological: She is alert and oriented to person, place, and time.  Cranial nerves 2-12 intact. Cerebellar exam reveals no dysmetria   Skin: Skin is warm and dry.  Psychiatric: She has a normal mood and affect. Judgment normal.  Nursing note and vitals reviewed.  ED Treatments / Results  DIAGNOSTIC STUDIES:  Oxygen Saturation is 100% on RA, normal by my interpretation.    COORDINATION OF CARE:  11:58 PM Discussed treatment plan with pt at bedside and pt agreed to plan.  Labs (all labs ordered are listed, but only abnormal results are displayed) Labs Reviewed  URINALYSIS, ROUTINE W REFLEX MICROSCOPIC - Abnormal; Notable for the following:       Result Value   Color, Urine STRAW (*)    APPearance HAZY (*)    Specific Gravity, Urine 1.003 (*)    Hgb urine dipstick SMALL (*)    Leukocytes, UA MODERATE (*)    Bacteria, UA RARE (*)    Squamous Epithelial / LPF 0-5 (*)    All other components within normal limits  BASIC METABOLIC PANEL - Abnormal; Notable for the following:    Potassium 3.2 (*)    BUN <5 (*)    Calcium 7.6 (*)    All other components within normal limits  CBC WITH DIFFERENTIAL/PLATELET -  Abnormal; Notable for the following:    WBC 11.1 (*)    RBC 2.80 (*)    Hemoglobin 6.5 (*)    HCT 20.0 (*)    MCV 71.4 (*)    MCH 23.2 (*)    RDW 16.2 (*)    Monocytes Absolute 1.2 (*)    All other components within normal limits  LIPASE, BLOOD  PREGNANCY, URINE  I-STAT CG4 LACTIC ACID, ED  POC OCCULT BLOOD, ED    EKG  EKG Interpretation None       Radiology No results found.  Procedures Procedures (including critical care time)  CRITICAL CARE Performed by: Varney Biles   Total critical care time: 45 minutes  Critical care time was exclusive of separately  billable procedures and treating other patients.  Critical care was necessary to treat or prevent imminent or life-threatening deterioration.  Critical care was time spent personally by me on the following activities: development of treatment plan with patient and/or surrogate as well as nursing, discussions with consultants, evaluation of patient's response to treatment, examination of patient, obtaining history from patient or surrogate, ordering and performing treatments and interventions, ordering and review of laboratory studies, ordering and review of radiographic studies, pulse oximetry and re-evaluation of patient's condition.   Medications Ordered in ED Medications  HYDROcodone-acetaminophen (NORCO/VICODIN) 5-325 MG per tablet 1 tablet (1 tablet Oral Refused 02/04/17 0054)  piperacillin-tazobactam (ZOSYN) IVPB 3.375 g (not administered)  pantoprazole (PROTONIX) injection 40 mg (not administered)  sodium chloride 0.9 % bolus 1,000 mL (0 mLs Intravenous Stopped 02/04/17 0131)  ondansetron (ZOFRAN) injection 4 mg (4 mg Intravenous Given 02/04/17 0010)  fentaNYL (SUBLIMAZE) injection 50 mcg (50 mcg Intravenous Given 02/04/17 0011)  oxyCODONE-acetaminophen (PERCOCET/ROXICET) 5-325 MG per tablet 1 tablet (1 tablet Oral Given 02/04/17 0011)  ondansetron (ZOFRAN-ODT) disintegrating tablet 4 mg (4 mg Oral Given  02/04/17 0053)  lactated ringers bolus 1,000 mL (1,000 mLs Intravenous New Bag/Given 02/04/17 0200)     Initial Impression / Assessment and Plan / ED Course  I have reviewed the triage vital signs and the nursing notes.  Pertinent labs & imaging results that were available during my care of the patient were reviewed by me and considered in my medical decision making (see chart for details).  Clinical Course as of Feb 04 442  Sat Feb 04, 2017  0439 Pt is jehova's witness. She has declined the blood.  Patient doesn't want blood transfusion for symptomatic anemia against medical advice. Patient understands that her actions will lead to inadequate medical treatment acutely,  and that she is at risk of complications of worsening condition, which includes morbidity and mortality.  Alternative options discussed - and pt wants iron treatment for now and fluids. Opportunity to change mind given. Discussion witnessed by treating RN Patient is demonstrating good capacity to make decision.    Hemoglobin: (!!) 6.5 [AN]  0441 CT results dont show any retroperitoneal bleed. There is concerns for abscess. Antibiotics started. Results from the ER workup discussed with the patient face to face and all questions answered to the best of my ability.  CT Abdomen Pelvis Wo Contrast [AN]  2035 [facility] accepting patient. Patient prefers continuity of care at [facility] Course User Index [AN] Varney Biles, MD    I personally performed the services described in this documentation, which was scribed in my presence. The recorded information has been reviewed and is accurate.   Pt comes in with cc of abd pain. She has hx of crohns. Pt had a proctectomy dones at [facility] earlier this month, and she also ha  Final Clinical Impressions(s) / ED Diagnoses   Final diagnoses:  Crohn's disease of colon with abscess ([place])  Acute blood loss anemia  Sepsis, due to unspecified organism [facility])    New  Prescriptions New Prescriptions   No medications on file     Varney Biles, MD 02/04/17 [phone]

## 2017-02-03 NOTE — ED Notes (Signed)
Bed: WA18 Expected date:  Expected time:  Means of arrival:  Comments: EMS 

## 2017-02-03 NOTE — ED Triage Notes (Signed)
Pt comes from home via EMS with complaints of nausea and vomiting since Tuesday.  Hx of Crohns Disease.  Had large intestines removed at [facility] on the 7th this month with colostomy in place.  Reports poor oral intake. BP 103/58, HR 78 in route. 20 G in RAC.  4 mg of zofran IV given in route. Last emesis around an hour ago.

## 2017-02-04 ENCOUNTER — Encounter (HOSPITAL_COMMUNITY): Payer: Self-pay | Admitting: Radiology

## 2017-02-04 ENCOUNTER — Emergency Department (HOSPITAL_COMMUNITY): Payer: 59

## 2017-02-04 DIAGNOSIS — K50114 Crohn's disease of large intestine with abscess: Secondary | ICD-10-CM | POA: Diagnosis not present

## 2017-02-04 DIAGNOSIS — A419 Sepsis, unspecified organism: Secondary | ICD-10-CM | POA: Diagnosis not present

## 2017-02-04 DIAGNOSIS — D500 Iron deficiency anemia secondary to blood loss (chronic): Secondary | ICD-10-CM | POA: Diagnosis not present

## 2017-02-04 DIAGNOSIS — R112 Nausea with vomiting, unspecified: Secondary | ICD-10-CM | POA: Diagnosis present

## 2017-02-04 DIAGNOSIS — Z79899 Other long term (current) drug therapy: Secondary | ICD-10-CM | POA: Diagnosis not present

## 2017-02-04 DIAGNOSIS — R1030 Lower abdominal pain, unspecified: Secondary | ICD-10-CM | POA: Diagnosis not present

## 2017-02-04 LAB — BASIC METABOLIC PANEL
Anion gap: 8 (ref 5–15)
CALCIUM: 7.6 mg/dL — AB (ref 8.9–10.3)
CO2: 22 mmol/L (ref 22–32)
CREATININE: 0.99 mg/dL (ref 0.44–1.00)
Chloride: 106 mmol/L (ref 101–111)
GFR calc non Af Amer: >60 mL/min (ref >60)
Glucose, Bld: 80 mg/dL (ref 65–99)
Potassium: 3.2 mmol/L — ABNORMAL LOW (ref 3.5–5.1)
Sodium: 136 mmol/L (ref 135–145)

## 2017-02-04 LAB — CBC WITH DIFFERENTIAL/PLATELET
BASOS PCT: 0 %
Basophils Absolute: 0 10*3/uL (ref 0.0–0.1)
Eosinophils Absolute: 0.1 10*3/uL (ref 0.0–0.7)
Eosinophils Relative: 1 %
HCT: 20 % — ABNORMAL LOW (ref 36.0–46.0)
HEMOGLOBIN: 6.5 g/dL — AB (ref 12.0–15.0)
Lymphocytes Relative: 29 %
Lymphs Abs: 3.3 10*3/uL (ref 0.7–4.0)
MCH: 23.2 pg — AB (ref 26.0–34.0)
MCHC: 32.5 g/dL (ref 30.0–36.0)
MCV: 71.4 fL — ABNORMAL LOW (ref 78.0–100.0)
MONO ABS: 1.2 10*3/uL — AB (ref 0.1–1.0)
MONOS PCT: 11 %
NEUTROS PCT: 58 %
Neutro Abs: 6.5 10*3/uL (ref 1.7–7.7)
PLATELETS: 349 10*3/uL (ref 150–400)
RBC: 2.8 MIL/uL — ABNORMAL LOW (ref 3.87–5.11)
RDW: 16.2 % — AB (ref 11.5–15.5)
WBC: 11.1 10*3/uL — ABNORMAL HIGH (ref 4.0–10.5)

## 2017-02-04 LAB — LIPASE, BLOOD: LIPASE: 42 U/L (ref 11–51)

## 2017-02-04 LAB — POC OCCULT BLOOD, ED: Fecal Occult Bld: POSITIVE — AB

## 2017-02-04 LAB — I-STAT CG4 LACTIC ACID, ED: Lactic Acid, Venous: 0.8 mmol/L (ref 0.5–1.9)

## 2017-02-04 LAB — PREGNANCY, URINE: PREG TEST UR: NEGATIVE

## 2017-02-04 MED ORDER — ONDANSETRON HCL 4 MG/2ML IJ SOLN
4.0000 mg | Freq: Once | INTRAMUSCULAR | Status: AC
  Administered 2017-02-04: 4 mg via INTRAVENOUS
  Filled 2017-02-04: qty 2

## 2017-02-04 MED ORDER — LACTATED RINGERS IV BOLUS (SEPSIS)
1000.0000 mL | Freq: Once | INTRAVENOUS | Status: AC
  Administered 2017-02-04: 1000 mL via INTRAVENOUS

## 2017-02-04 MED ORDER — HYDROCODONE-ACETAMINOPHEN 5-325 MG PO TABS
1.0000 | ORAL_TABLET | Freq: Once | ORAL | Status: AC
Start: 2017-02-04 — End: 2017-02-04
  Administered 2017-02-04: 1 via ORAL
  Filled 2017-02-04 (×2): qty 1

## 2017-02-04 MED ORDER — FENTANYL CITRATE (PF) 100 MCG/2ML IJ SOLN
50.0000 ug | Freq: Once | INTRAMUSCULAR | Status: AC
  Administered 2017-02-04: 50 ug via INTRAVENOUS
  Filled 2017-02-04: qty 2

## 2017-02-04 MED ORDER — PIPERACILLIN-TAZOBACTAM 3.375 G IVPB
3.3750 g | Freq: Once | INTRAVENOUS | Status: AC
  Administered 2017-02-04: 3.375 g via INTRAVENOUS
  Filled 2017-02-04: qty 50

## 2017-02-04 MED ORDER — PANTOPRAZOLE SODIUM 40 MG IV SOLR
40.0000 mg | Freq: Once | INTRAVENOUS | Status: AC
  Administered 2017-02-04: 40 mg via INTRAVENOUS
  Filled 2017-02-04: qty 40

## 2017-02-04 MED ORDER — ONDANSETRON 4 MG PO TBDP
4.0000 mg | ORAL_TABLET | Freq: Once | ORAL | Status: AC
  Administered 2017-02-04: 4 mg via ORAL
  Filled 2017-02-04: qty 1

## 2017-02-04 NOTE — ED Notes (Signed)
Pt given coke and crackers --  

## 2017-02-16 ENCOUNTER — Emergency Department (HOSPITAL_BASED_OUTPATIENT_CLINIC_OR_DEPARTMENT_OTHER): Payer: 59

## 2017-02-16 ENCOUNTER — Emergency Department (HOSPITAL_BASED_OUTPATIENT_CLINIC_OR_DEPARTMENT_OTHER)
Admission: EM | Admit: 2017-02-16 | Discharge: 2017-02-16 | Disposition: A | Discharge status: Other Acute Inpatient Hospital | Payer: 59 | Attending: Emergency Medicine | Admitting: Emergency Medicine

## 2017-02-16 ENCOUNTER — Encounter (HOSPITAL_BASED_OUTPATIENT_CLINIC_OR_DEPARTMENT_OTHER): Payer: Self-pay | Admitting: Emergency Medicine

## 2017-02-16 DIAGNOSIS — J3489 Other specified disorders of nose and nasal sinuses: Secondary | ICD-10-CM | POA: Diagnosis not present

## 2017-02-16 DIAGNOSIS — J029 Acute pharyngitis, unspecified: Secondary | ICD-10-CM | POA: Diagnosis not present

## 2017-02-16 DIAGNOSIS — R42 Dizziness and giddiness: Secondary | ICD-10-CM | POA: Insufficient documentation

## 2017-02-16 DIAGNOSIS — T8143XA Infection following a procedure, organ and space surgical site, initial encounter: Secondary | ICD-10-CM

## 2017-02-16 DIAGNOSIS — R0981 Nasal congestion: Secondary | ICD-10-CM | POA: Diagnosis not present

## 2017-02-16 DIAGNOSIS — Z8719 Personal history of other diseases of the digestive system: Secondary | ICD-10-CM | POA: Insufficient documentation

## 2017-02-16 DIAGNOSIS — Z933 Colostomy status: Secondary | ICD-10-CM | POA: Insufficient documentation

## 2017-02-16 DIAGNOSIS — T8149XA Infection following a procedure, other surgical site, initial encounter: Secondary | ICD-10-CM

## 2017-02-16 DIAGNOSIS — K6811 Postprocedural retroperitoneal abscess: Secondary | ICD-10-CM | POA: Diagnosis not present

## 2017-02-16 DIAGNOSIS — R1084 Generalized abdominal pain: Secondary | ICD-10-CM | POA: Diagnosis present

## 2017-02-16 LAB — COMPREHENSIVE METABOLIC PANEL
ALT: 16 U/L (ref 14–54)
AST: 19 U/L (ref 15–41)
Albumin: 2.7 g/dL — ABNORMAL LOW (ref 3.5–5.0)
Alkaline Phosphatase: 67 U/L (ref 38–126)
Anion gap: 10 (ref 5–15)
BILIRUBIN TOTAL: 0.3 mg/dL (ref 0.3–1.2)
CO2: 23 mmol/L (ref 22–32)
CREATININE: 0.95 mg/dL (ref 0.44–1.00)
Calcium: 8.6 mg/dL — ABNORMAL LOW (ref 8.9–10.3)
Chloride: 101 mmol/L (ref 101–111)
Glucose, Bld: 109 mg/dL — ABNORMAL HIGH (ref 65–99)
Potassium: 3.2 mmol/L — ABNORMAL LOW (ref 3.5–5.1)
Sodium: 134 mmol/L — ABNORMAL LOW (ref 135–145)
TOTAL PROTEIN: 8.8 g/dL — AB (ref 6.5–8.1)

## 2017-02-16 LAB — URINALYSIS, ROUTINE W REFLEX MICROSCOPIC
Bilirubin Urine: NEGATIVE
Glucose, UA: NEGATIVE mg/dL
Ketones, ur: NEGATIVE mg/dL
NITRITE: NEGATIVE
PROTEIN: NEGATIVE mg/dL
Specific Gravity, Urine: 1.003 — ABNORMAL LOW (ref 1.005–1.030)
pH: 5.5 (ref 5.0–8.0)

## 2017-02-16 LAB — CBC WITH DIFFERENTIAL/PLATELET
BASOS ABS: 0 10*3/uL (ref 0.0–0.1)
BASOS PCT: 0 %
EOS ABS: 0.1 10*3/uL (ref 0.0–0.7)
EOS PCT: 1 %
HCT: 23.6 % — ABNORMAL LOW (ref 36.0–46.0)
HEMOGLOBIN: 7.5 g/dL — AB (ref 12.0–15.0)
Lymphocytes Relative: 21 %
Lymphs Abs: 2.3 10*3/uL (ref 0.7–4.0)
MCH: 23.8 pg — ABNORMAL LOW (ref 26.0–34.0)
MCHC: 31.8 g/dL (ref 30.0–36.0)
MCV: 74.9 fL — ABNORMAL LOW (ref 78.0–100.0)
Monocytes Absolute: 1.1 10*3/uL — ABNORMAL HIGH (ref 0.1–1.0)
Monocytes Relative: 10 %
Neutro Abs: 7.4 10*3/uL (ref 1.7–7.7)
Neutrophils Relative %: 68 %
PLATELETS: 721 10*3/uL — AB (ref 150–400)
RBC: 3.15 MIL/uL — AB (ref 3.87–5.11)
RDW: 16.9 % — ABNORMAL HIGH (ref 11.5–15.5)
WBC: 10.8 10*3/uL — AB (ref 4.0–10.5)

## 2017-02-16 LAB — URINALYSIS, MICROSCOPIC (REFLEX)

## 2017-02-16 LAB — I-STAT CG4 LACTIC ACID, ED: Lactic Acid, Venous: 0.97 mmol/L (ref 0.5–1.9)

## 2017-02-16 LAB — LIPASE, BLOOD: Lipase: 30 U/L (ref 11–51)

## 2017-02-16 LAB — PREGNANCY, URINE: Preg Test, Ur: NEGATIVE

## 2017-02-16 MED ORDER — MORPHINE SULFATE (PF) 4 MG/ML IV SOLN
4.0000 mg | Freq: Once | INTRAVENOUS | Status: AC
Start: 2017-02-16 — End: 2017-02-16
  Administered 2017-02-16: 4 mg via INTRAVENOUS
  Filled 2017-02-16: qty 1

## 2017-02-16 MED ORDER — ONDANSETRON HCL 4 MG/2ML IJ SOLN
4.0000 mg | Freq: Once | INTRAMUSCULAR | Status: AC
  Administered 2017-02-16: 4 mg via INTRAVENOUS
  Filled 2017-02-16: qty 2

## 2017-02-16 MED ORDER — IOPAMIDOL (ISOVUE-300) INJECTION 61%
100.0000 mL | Freq: Once | INTRAVENOUS | Status: AC | PRN
  Administered 2017-02-16: 100 mL via INTRAVENOUS

## 2017-02-16 MED ORDER — FENTANYL CITRATE (PF) 100 MCG/2ML IJ SOLN
50.0000 ug | Freq: Once | INTRAMUSCULAR | Status: AC
  Administered 2017-02-16: 50 ug via INTRAVENOUS
  Filled 2017-02-16: qty 2

## 2017-02-16 MED ORDER — ACETAMINOPHEN 325 MG PO TABS
650.0000 mg | ORAL_TABLET | Freq: Once | ORAL | Status: AC
  Administered 2017-02-16: 650 mg via ORAL
  Filled 2017-02-16: qty 2

## 2017-02-16 MED ORDER — SODIUM CHLORIDE 0.9 % IV BOLUS (SEPSIS)
1000.0000 mL | Freq: Once | INTRAVENOUS | Status: AC
  Administered 2017-02-16: 1000 mL via INTRAVENOUS

## 2017-02-16 MED ORDER — PIPERACILLIN-TAZOBACTAM 3.375 G IVPB
3.3750 g | Freq: Once | INTRAVENOUS | Status: AC
  Administered 2017-02-16: 3.375 g via INTRAVENOUS
  Filled 2017-02-16: qty 50

## 2017-02-16 NOTE — ED Notes (Signed)
ED Provider at bedside discussing plan of care. 

## 2017-02-16 NOTE — ED Triage Notes (Signed)
Pt presents to ed with complaints of back pain since Tuesday   And Abdominal pain since March 7 after she had a total colectomy.

## 2017-02-16 NOTE — ED Notes (Signed)
Piv attempt x 1 left AC unsuccessful

## 2017-02-16 NOTE — ED Provider Notes (Signed)
[facility]   CSN: [id number] Arrival date & time: 02/16/17  1444     History   Chief Complaint Chief Complaint  Patient presents with  . Abdominal Pain    HPI Sara Patel is a 31 y.o. female.  Sara Patel is a 31 y.o. female with h/o Crohn's disease presents to ED with complaint of fever, abdominal pain, and back pain. Patient has a h/o Crohn's disease. Recently underwent partial colectomy and partial removal of rectum 01/25/17 at [facility]. She was discharged on 01/30/17. She was seen at [facility] ED on 02/03/17 for abdominal pain with nausea and vomiting; subsequently admitted and transferred to [facility] where she received IV ABX. She was discharged on PO Ciprofloxacin on 02/05/17 with seemingly no complication until Tuesday 02/14/17 when she developed a fever. Tmax 101.3. She reports associated b/l low back pain, diffuse abdominal pain worse in LLQ, nausea, and dizziness. She denies URI sxs, visual disturbance, chest pain, shortness of breath, vomiting, dysuria, hematuria, rash, loss of consciousness, numbness, weakness, loss of bowel/bladder function, or any other complaints.        Past Medical History:  Diagnosis Date  . Crohn disease ([facility]) 2000   Diagnosed in 2000. GI at [facility]  . Pancreatitis     Patient Active Problem List   Diagnosis Date Noted  . Right kidney mass 02/05/2016  . Frequent UTI 01/18/2016  . History of pancreatitis 09/30/2015  . SI (sacroiliac) pain 09/29/2015  . 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 S/P ileostomy ([facility]) 09/07/2015  . Crohn's colitis ([facility]) 08/29/2015    Past Surgical History:  Procedure Laterality Date  . ABDOMINAL SURGERY     2006, 2011 resections   . ILEOSTOMY  2003  . rectal abscess  2014   . TOTAL COLECTOMY      OB History    No data available       Home Medications    Prior to Admission medications   Medication Sig Start Date End Date Taking? Authorizing Provider  acetaminophen (TYLENOL) 325 MG tablet Take  650 mg by mouth every 6 (six) hours as needed (pain).    Historical Provider, MD  acetaminophen-codeine (TYLENOL #3) 300-30 MG tablet Take 1-2 tablets by mouth at bedtime as needed for moderate pain. Patient not taking: Reported on 02/04/2017 09/29/15   Dessa Phi, MD  Adalimumab (HUMIRA PEN) 40 MG/0.8ML PNKT Inject 40 mg into the skin every 7 (seven) days. 08/10/15   Historical Provider, MD  dicyclomine (BENTYL) 20 MG tablet Take 1 tablet (20 mg total) by mouth 4 (four) times daily -  before meals and at bedtime. Patient not taking: Reported on 02/04/2017 12/22/15   Dessa Phi, MD  ferrous gluconate (FERGON) 324 MG tablet Take 324 mg by mouth daily. 08/04/15 08/03/16  Historical Provider, MD  fluconazole (DIFLUCAN) 200 MG tablet Take 1 tablet (200 mg total) by mouth daily. For two weeks Patient not taking: Reported on 02/04/2017 01/21/16   Dessa Phi, MD  hydrocortisone (CORTENEMA) 100 MG/60ML enema Place 1 enema (100 mg total) rectally at bedtime. Patient not taking: Reported on 02/04/2017 09/29/15   Dessa Phi, MD  meloxicam (MOBIC) 7.5 MG tablet Take 1 tablet (7.5 mg total) by mouth daily. Patient not taking: Reported on 02/04/2017 09/07/15   Dessa Phi, MD  ondansetron (ZOFRAN) 4 MG tablet Take 1 tablet (4 mg total) by mouth every 8 (eight) hours as needed for nausea. 03/14/13   Renae Fickle, MD  Oxycodone HCl 10 MG TABS Take 10  mg by mouth every 4 (four) hours as needed for pain. 01/30/17   Historical Provider, MD    Family History Family History  Problem Relation Age of Onset  . Hypertension Father     Social History Social History  Substance Use Topics  . Smoking status: Never Smoker  . Smokeless tobacco: Never Used  . Alcohol use No     Allergies   Iron dextran   Review of Systems Review of Systems  Constitutional: Positive for fever.  HENT: Positive for congestion, rhinorrhea and sore throat. Negative for trouble swallowing.   Eyes: Negative for visual  disturbance.  Respiratory: Negative for cough and shortness of breath.   Cardiovascular: Negative for chest pain.  Gastrointestinal: Positive for abdominal pain and nausea. Negative for blood in stool and vomiting.  Genitourinary: Negative for dysuria and hematuria.  Musculoskeletal: Positive for back pain.  Skin: Negative for rash.  Neurological: Positive for dizziness. Negative for syncope, weakness, numbness and headaches.     Physical Exam Updated Vital Signs BP 109/71   Pulse (!) 107   Temp (!) 100.8 F (38.2 C) (Oral)   Resp (!) 26   Ht 5\' 4"  (1.626 m)   Wt 72.6 kg   LMP 01/29/2017   SpO2 96%   BMI 27.46 kg/m   Physical Exam  Constitutional: She appears well-developed and well-nourished. No distress.  HENT:  Head: Normocephalic and atraumatic.  Mouth/Throat: Oropharynx is clear and moist. No oropharyngeal exudate.  Eyes: Conjunctivae and EOM are normal. Pupils are equal, round, and reactive to light. Right eye exhibits no discharge. Left eye exhibits no discharge. No scleral icterus.  Neck: Normal range of motion and phonation normal. Neck supple. No neck rigidity. Normal range of motion present.  Cardiovascular: Regular rhythm, normal heart sounds and intact distal pulses.  Tachycardia present.   No murmur heard. Pulmonary/Chest: Breath sounds normal. No stridor. Tachypnea noted. No respiratory distress. She has no wheezes. She has no rales.  Abdominal: Soft. Bowel sounds are normal. She exhibits no distension. There is generalized tenderness. There is CVA tenderness ( b/l). There is no rigidity, no rebound and no guarding.  Well appearing surgical scar. Colostomy bag in RLQ. Diffuse TTP with mild guarding. No peritoneal signs. No rebound. B/l CVA tenderness.   Musculoskeletal: Normal range of motion.  No midline spinal tenderness.   Lymphadenopathy:    She has no cervical adenopathy.  Neurological: She is alert. She has normal strength. She is not disoriented. No  sensory deficit. Coordination and gait normal. GCS eye subscore is 4. GCS verbal subscore is 5. GCS motor subscore is 6.  Mental Status: Alert, thought content appropriate, able to give a coherent history. Speech fluent without evidence of aphasia.  CN 2-12 grossly intact.  Moves extremities with ease. Sensation grossly equal and intact throughout. Strength 5/5 in all extremities.    Skin: Skin is warm and dry. She is not diaphoretic.  Psychiatric: She has a normal mood and affect. Her behavior is normal.     ED Treatments / Results  Labs (all labs ordered are listed, but only abnormal results are displayed) Labs Reviewed  COMPREHENSIVE METABOLIC PANEL - Abnormal; Notable for the following:       Result Value   Sodium 134 (*)    Potassium 3.2 (*)    Glucose, Bld 109 (*)    BUN <5 (*)    Calcium 8.6 (*)    Total Protein 8.8 (*)    Albumin 2.7 (*)  All other components within normal limits  CBC WITH DIFFERENTIAL/PLATELET - Abnormal; Notable for the following:    WBC 10.8 (*)    RBC 3.15 (*)    Hemoglobin 7.5 (*)    HCT 23.6 (*)    MCV 74.9 (*)    MCH 23.8 (*)    RDW 16.9 (*)    Platelets 721 (*)    Monocytes Absolute 1.1 (*)    All other components within normal limits  URINALYSIS, ROUTINE W REFLEX MICROSCOPIC - Abnormal; Notable for the following:    APPearance CLOUDY (*)    Specific Gravity, Urine 1.003 (*)    Hgb urine dipstick TRACE (*)    Leukocytes, UA LARGE (*)    All other components within normal limits  URINALYSIS, MICROSCOPIC (REFLEX) - Abnormal; Notable for the following:    Bacteria, UA FEW (*)    Squamous Epithelial / LPF 6-30 (*)    All other components within normal limits  CULTURE, BLOOD (ROUTINE X 2)  CULTURE, BLOOD (ROUTINE X 2)  LIPASE, BLOOD  PREGNANCY, URINE  I-STAT CG4 LACTIC ACID, ED    EKG  EKG Interpretation None       Radiology No results found.  Procedures Procedures (including critical care time)  Medications Ordered in  ED Medications  sodium chloride 0.9 % bolus 1,000 mL (1,000 mLs Intravenous New Bag/Given 02/16/17 1529)  ondansetron (ZOFRAN) injection 4 mg (4 mg Intravenous Given 02/16/17 1601)  fentaNYL (SUBLIMAZE) injection 50 mcg (50 mcg Intravenous Given 02/16/17 1601)  iopamidol (ISOVUE-300) 61 % injection 100 mL (100 mLs Intravenous Contrast Given 02/16/17 1632)  acetaminophen (TYLENOL) tablet 650 mg (650 mg Oral Given 02/16/17 1643)     Initial Impression / Assessment and Plan / ED Course  I have reviewed the triage vital signs and the nursing notes.  Pertinent labs & imaging results that were available during my care of the patient were reviewed by me and considered in my medical decision making (see chart for details).     Patient presents to ED with complaint of fever, abdominal pain, and nausea onset approximately 2 days ago. Recently underwent partial colectomy and had part of rectum removed in early march 2017 at [facility] for Crohn's. She had a CT scan performed in middle of march when she returned to hospital for abdominal pain with nausea and vomiting; CT remarkable for gas and fluid in left pericolic guttter concerning for abscess as well as mesenteric edema, left pleural effusion with atelectasis - she was subsequently admitted and discharged on 02/05/17 for these findings.   Patient is febrile. She is non-toxic appearing in NAD. She is mildly tachycardic and slightly tachypneic. Well appearing abdominal surgical scar, colostomy bag in RLQ. Diffuse abdominal tenderness with mild guarding. No peritoneal signs. Positive bowel sounds. B/l CVA tenderness. Given vitals sepsis order set initiated. IVF, pain medication, zofran provided. Discussed patient with Dr. Judd Lien, agrees with plan.   Upreg negative. Lactic acid nml. CBC remarkable for leukocytosis; hemoglobin improving from previous at 7.5. CMP grossly unremarkable. U/A remarkable for leukocytes as well as WBC and few bacteria; 6-30 squamous epithelial -  ?UTI vs. ?contaminated specimen - given b/l tenderness ?pyelonephritis. CT abd/pelvis pending.   At shift change, patient care transferred to Dr. Judd Lien. Dispo pending CT abd/pelvis.   Final Clinical Impressions(s) / ED Diagnoses   Final diagnoses:  None    New Prescriptions New Prescriptions   No medications on file     Deborha Payment, PA-C 02/16/17 1655  Geoffery Lyons, MD 02/16/17 [phone]

## 2017-02-21 LAB — CULTURE, BLOOD (ROUTINE X 2): CULTURE: NO GROWTH

## 2017-04-05 ENCOUNTER — Encounter: Payer: Self-pay | Admitting: Family Medicine

## 2017-06-16 IMAGING — CT CT ABD-PELV W/ CM
2 of 4 series · 15 of 46 positions shown, 17 images · IV contrast (APPLIED)
Comparison: 02/04/2017.

CLINICAL DATA: Fever and low back pain. Status post colectomy
01/25/2017 at outside institution. History of Crohn disease.

EXAM:
CT ABDOMEN AND PELVIS WITH CONTRAST
TECHNIQUE: Multidetector CT imaging of the abdomen and pelvis was performed
using the standard protocol following bolus administration of
intravenous contrast.
CONTRAST:  100mL GVZQBH-JFF IOPAMIDOL (GVZQBH-JFF) INJECTION 61%

[Series 2: axial st · axial · 0.80mm/px · z∈[+662,+1072]mm · 12 of 94 slices shown, 14 images]
[im 8/94  soft-tissue]
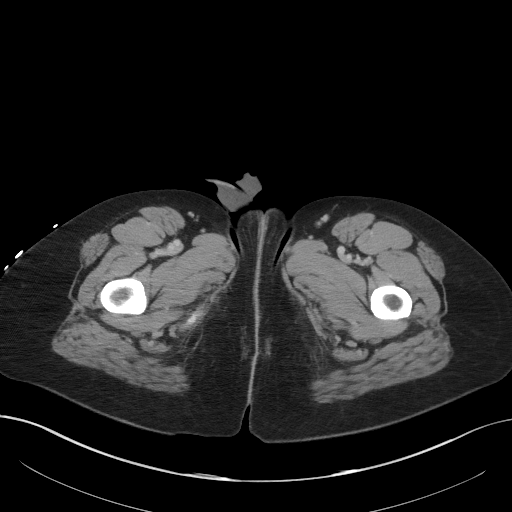
[im 8/94  bone]
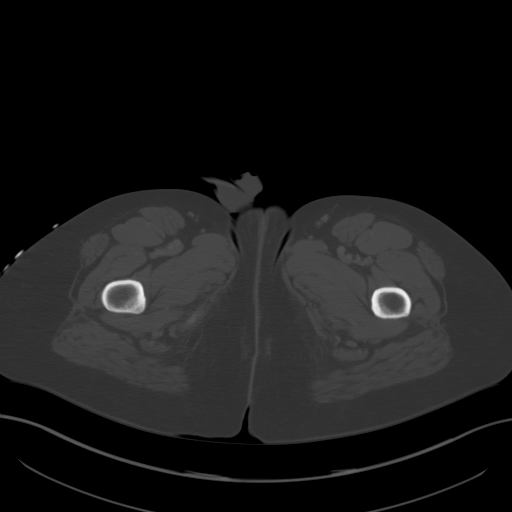
[im 15/94  soft-tissue]
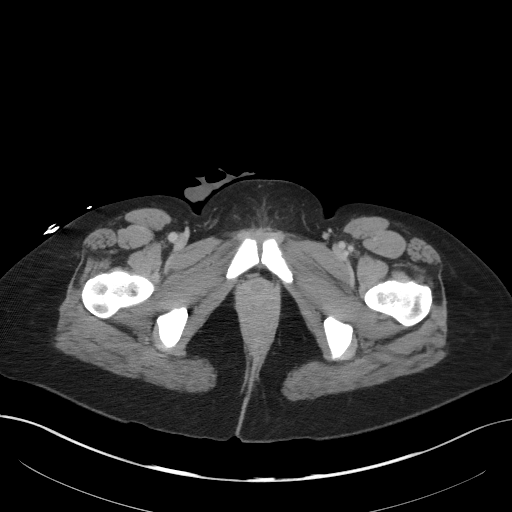
[im 23/94  soft-tissue]
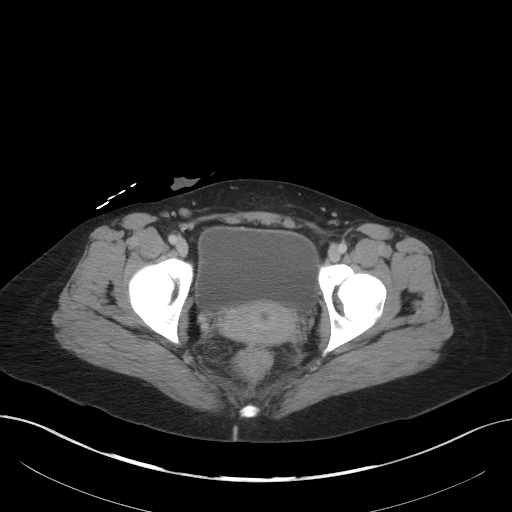
[im 30/94  soft-tissue]
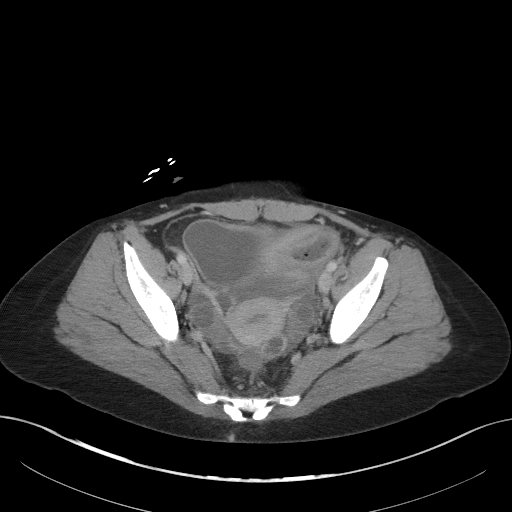
[im 38/94  soft-tissue]
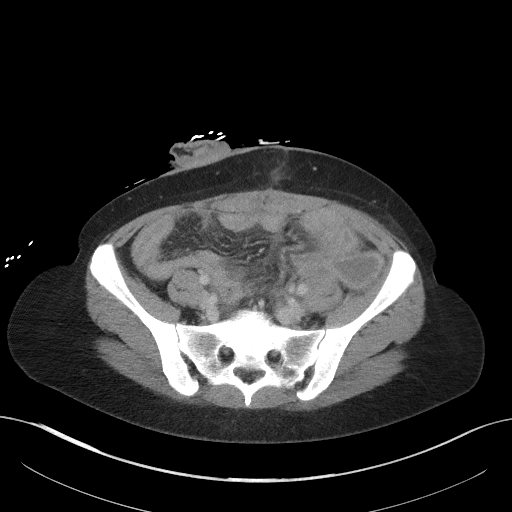
[im 45/94  soft-tissue]
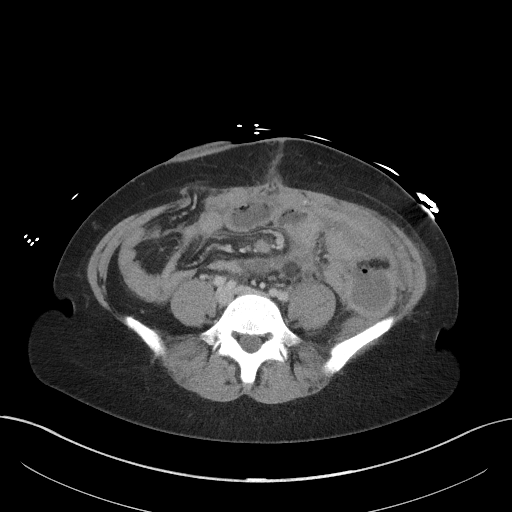
[im 53/94  soft-tissue]
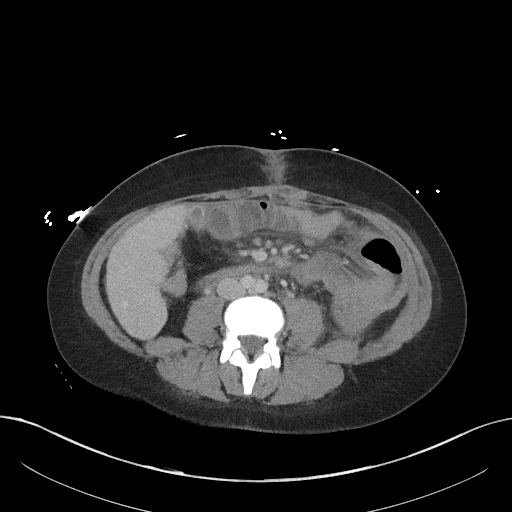
[im 60/94  soft-tissue]
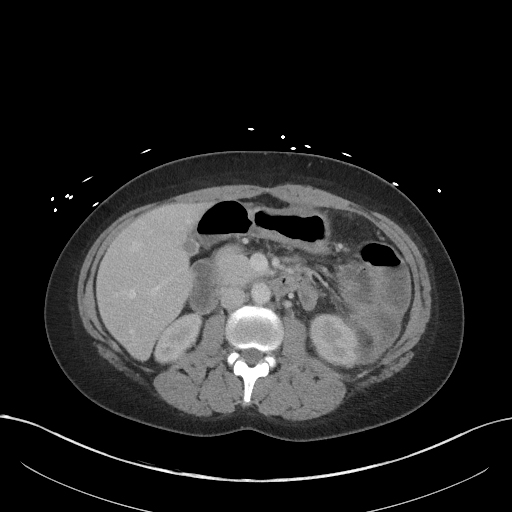
[im 67/94  soft-tissue]
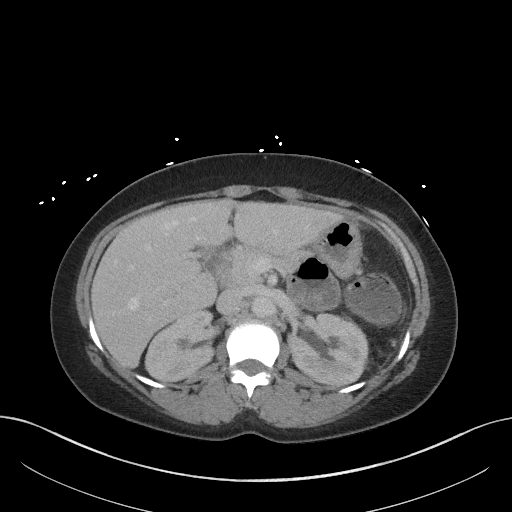
[im 67/94  bone]
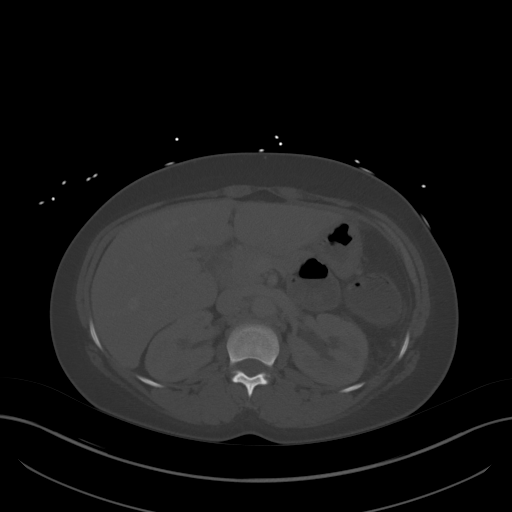
[im 75/94  soft-tissue]
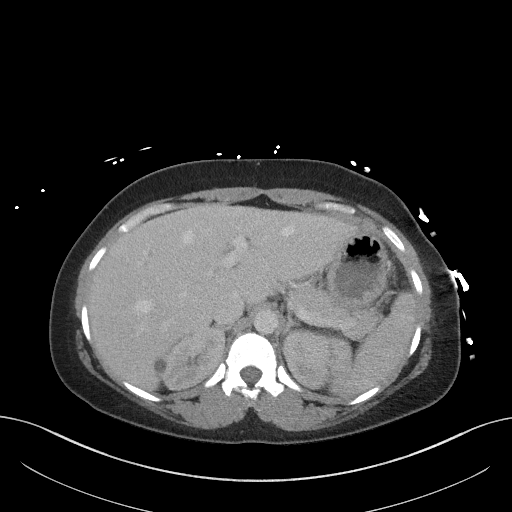
[im 82/94  soft-tissue]
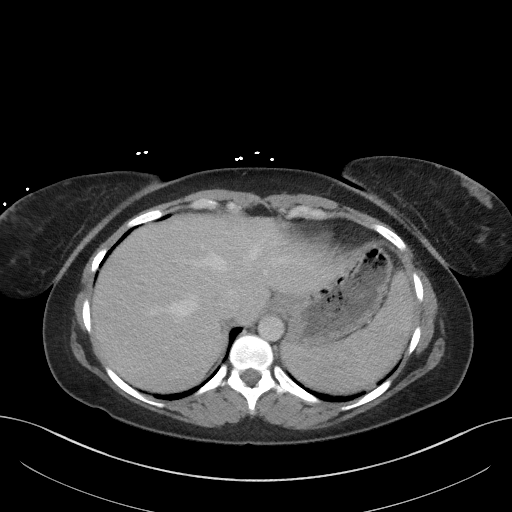
[im 90/94  soft-tissue]
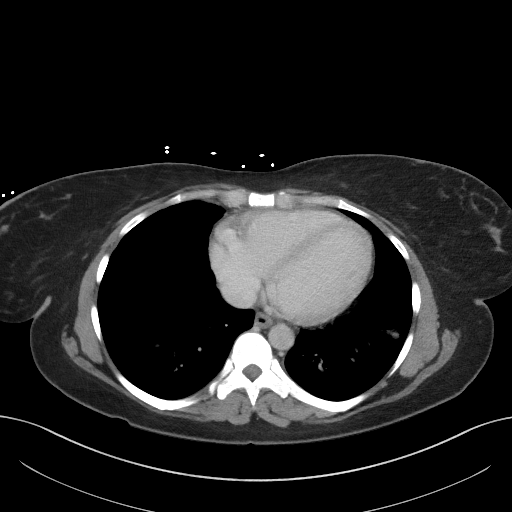

[Series 5: coronal st · coronal · 0.87mm/px · 3 of 74 slices shown]
[im 25/74  soft-tissue]
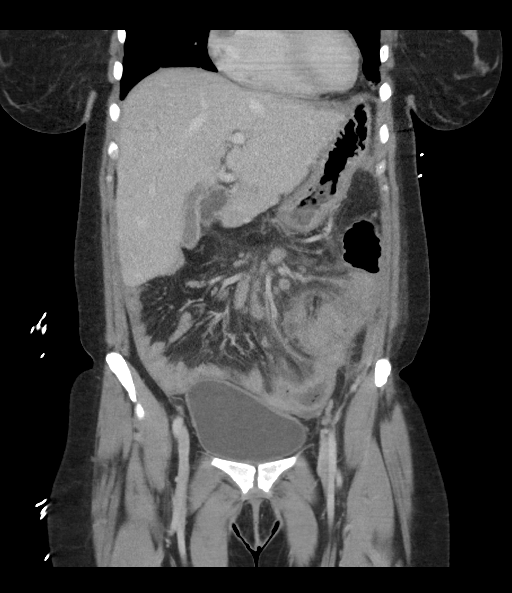
[im 33/74  soft-tissue]
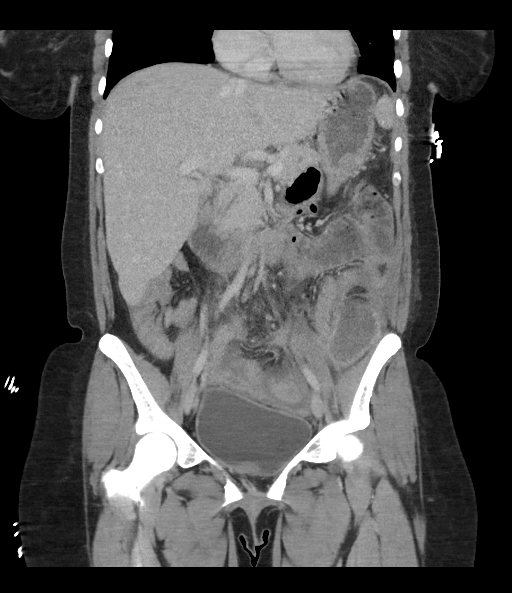
[im 41/74  soft-tissue]
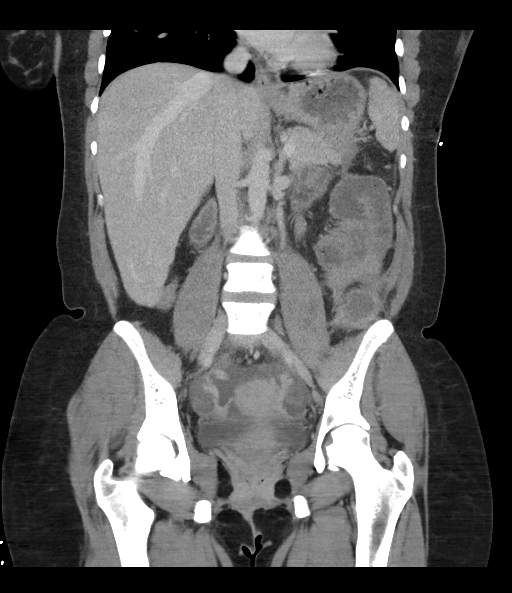

[15 of 46 positions shown; findings below may reference images not displayed]

FINDINGS: Lower chest:  Subsegmental atelectasis left lower lobe.

Hepatobiliary: No focal abnormality within the liver parenchyma.
There is no evidence for gallstones, gallbladder wall thickening, or
pericholecystic fluid. No intrahepatic or extrahepatic biliary
dilation.

Pancreas: No focal mass lesion. No dilatation of the main duct. No
intraparenchymal cyst. No peripancreatic edema.

Spleen: No splenomegaly. No focal mass lesion.

Adrenals/Urinary Tract: No adrenal nodule or mass. Small
well-defined hypo with attenuating lesions in the right kidney are
compatible with cysts. No enhancing lesion identified in either
kidney. No evidence for hydroureter. The urinary bladder appears
normal for the degree of distention.

Stomach/Bowel: Stomach is nondistended. No gastric wall thickening.
No evidence of outlet obstruction. Duodenum is normally positioned
as is the ligament of Treitz. There are some small bowel loops in
the mid abdomen measuring up to 2.3 cm diameter and demonstrating
small bowel feces sign suggesting decreased transit. Interloop
mesenteric fluid and mesenteric congestion is identified, more so on
the left abdomen than the right.

9.2 x 5.2 x 3.1 cm rim enhancing collection of gas and fluid
identified left para colic gutter, compatible with abscess.

Small bowel loop left lower quadrant with fairly substantial
interloop mesenteric fluid and adjacent mesenteric congestion.

Areas of linear/tubular fluid identified in the central mesentery
(coronal image 31 series 5) then tracking towards the collection in
the left para colic gutter (image 31 as well).

Short Hartmann's pouch identified with suture line along the rectal
stump noted image 67 series 2. Right lower quadrant stoma presumably
end ileostomy.

Vascular/Lymphatic: No abdominal aortic aneurysm. Small
retroperitoneal lymph nodes evident.

Reproductive: Uterus unremarkable.  No adnexal mass.

Other: Free fluid noted in the cul-de-sac and adnexal regions. Free
intraperitoneal and interloop mesenteric fluid noted.

Musculoskeletal: Bone windows reveal no worrisome lytic or sclerotic
osseous lesions.
IMPRESSION: 1. Study limited by lack of oral contrast material.
2. Apparent 9 x 5 x 3 cm abscess left para colic gutter.
3. Possible other smaller intraperitoneal abscess collections versus
fistulous tracts in this patient with a history of Crohn disease.
4. Two separate areas of small bowel demonstrating fecalized
intraluminal contents compatible with decreased motility. The more
inferior of these is in the left lower quadrant and has a
substantial amount of interloop mesenteric fluid and congestion of
the mesenteric vasculature. Features may be related to inflammatory
ileus or primary small bowel involvement by inflammatory bowel
disease. No substantial small bowel wall thickening or pneumatosis
to suggest ischemia at this time. No overt small bowel obstruction.
5. Trace intraperitoneal free air seen previously has resolved in
the interval.

## 2018-11-21 NOTE — L&D Delivery Note (Signed)
Cesarean Section Procedure Note - Part II   Indications: stat c-section for cord prolapse - code cesarean  Pre-operative Diagnosis: Cord prolapse   Post-operative Diagnosis: same  Surgeon:  Delorise Royals STACIA   Assistants: Everett Graff MD  Anesthesia: Epidural anesthesia  ASA Class: 2   Procedure Details   I arrived at the Operating room, just after the baby was born and the uterus was still open.  I took over the case from Dr. Harolyn Rutherford.   The uterus was outside of the abdomen.  The hysterotomy was closed with running locked sutures of 0- vicryl. A second imbricating suture was performed. 500 mL of warm sterile saline was used for irrigation and the uterus placed back in the abdomen.  The incision was evaluated and there was some oozing in several places so a third suture line of 0- Monocryl was placed and the incision was noted to be hemostatic.    A peritoneal adhesion to the small bowel was taken down carefully using Metzenbaum scissors.  The peritoneum was closed in running fashion with 2-0 chromic.  The muscle was repaired with interrupted sutures of 2-0 chromic.   The fascia was then reapproximated with running sutures of 0 PDS starting from each end and meeting in the middle.   The subcutaneous layer was re-approximated with interrupted suture of 2-0 plain. This layer was infiltrated with 20 mL of 0.25% Marcaine.   The skin was closed with subcuticular suture of 4-0 vicryl on a Keith needle. Benzoin, steri strips, a honeycomb bandage and pressure dressing was placed.   Instrument, sponge, and needle counts were correct prior the abdominal closure and at the conclusion of the case.   Findings: Normal uterus, bilateral tubes and ovaries. Adhesion b/w small bowel and abdominal peritoneum. Baby girl Apgars pending - went to NICU  Estimated Blood Loss:  464 mL         Drains: Foley 300 mL of clear urine          Total IV Fluids:  2700 ml          Specimens: Placenta, Cord blood, cord gases                 Complications:  None         Disposition: PACU - hemodynamically stable.         Condition: stable  Attending Attestation: I was present and scrubbed for the key portions of the procedure.  Sanjuana Kava [facility]

## 2019-02-20 ENCOUNTER — Other Ambulatory Visit (HOSPITAL_COMMUNITY): Payer: Self-pay | Admitting: Obstetrics and Gynecology

## 2019-02-20 LAB — OB RESULTS CONSOLE RPR: RPR: NONREACTIVE

## 2019-02-20 LAB — OB RESULTS CONSOLE GC/CHLAMYDIA
Chlamydia: NEGATIVE
Gonorrhea: NEGATIVE

## 2019-02-20 LAB — OB RESULTS CONSOLE RUBELLA ANTIBODY, IGM
Rubella: IMMUNE
Rubella: IMMUNE

## 2019-02-20 LAB — OB RESULTS CONSOLE HIV ANTIBODY (ROUTINE TESTING): HIV: NONREACTIVE

## 2019-02-20 LAB — OB RESULTS CONSOLE HEPATITIS B SURFACE ANTIGEN
Hepatitis B Surface Ag: NEGATIVE
Hepatitis B Surface Ag: NEGATIVE

## 2019-02-20 LAB — OB RESULTS CONSOLE ABO/RH: RH Type: POSITIVE

## 2019-02-20 LAB — OB RESULTS CONSOLE ANTIBODY SCREEN: Antibody Screen: NEGATIVE

## 2019-03-19 ENCOUNTER — Other Ambulatory Visit (HOSPITAL_COMMUNITY): Payer: Self-pay | Admitting: Obstetrics and Gynecology

## 2019-03-21 ENCOUNTER — Encounter (HOSPITAL_COMMUNITY): Payer: Self-pay | Admitting: *Deleted

## 2019-03-22 ENCOUNTER — Other Ambulatory Visit: Payer: Self-pay

## 2019-03-22 ENCOUNTER — Ambulatory Visit (HOSPITAL_COMMUNITY): Payer: 59 | Attending: Obstetrics and Gynecology | Admitting: Maternal & Fetal Medicine

## 2019-03-22 DIAGNOSIS — K50119 Crohn's disease of large intestine with unspecified complications: Secondary | ICD-10-CM

## 2019-03-28 NOTE — Progress Notes (Signed)
Telehealth consultation over video  Requesting provider: Dr Charlesetta Garibaldi Date of Service: 03/22/2019 Reason for request: Crohn's disease and mode of delivery  I met with Sara Patel at 66 you G1P0 at 11 weeks based on LMP  Confirmed by early ultrasound. We met at the request of Dr.   Charlesetta Garibaldi for discussion of mode of delivery given her prior surgeries and Crohns disease.  She is doing well today without complaints. She a has a panorama pending and AFP.   Her pregnancy issues:  1) Crohn's disease: Sara Patel has had long standing Crohn's disease. She is managed by [facility] Dr. Brayton Layman, MD- GI her history is detailed in Care everywhere in [place]. However, briefly she has a permanent ostomy, taking weekly humira, complete colectomy/proctectomy in 11/6071, complicated by fluid collection. 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 She has her next appointments with GI and Hematology on 05/14/19.   During our visit today she reports being very stable.    2) Jehovah's witness: She has a power of attorney. Will accept cell saver. No whole blood. Dr. Mancel Bale has this information reported in her records.    Active Ambulatory Problems    Diagnosis Date Noted  . Crohn's colitis ([place]) 08/29/2015  . 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 SI (sacroiliac) pain 09/29/2015  . 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 History of pancreatitis 09/30/2015  . Frequent UTI 01/18/2016  . Right kidney mass 02/05/2016   Resolved Ambulatory Problems    Diagnosis Date Noted  . Pancreatitis, acute 03/08/2013  . Hypotension, unspecified 03/13/2013  . Hypoglycemia 03/13/2013  . Hypokalemia 03/13/2013  . Sepsis ([place]) 08/29/2015  . Crohn's disease ([place])   . UTI (urinary tract infection) 08/30/2015  . Joint pain 09/07/2015  . Abdominal pain 09/29/2015   Past Medical History:  Diagnosis Date  . Crohn disease ([place]) 2000  . Pancreatitis     Past Surgical History:  Procedure Laterality Date  .  ABDOMINAL SURGERY     2006, 2011 resections   . ILEOSTOMY  2003  . rectal abscess  2014   . TOTAL COLECTOMY      Social History   Socioeconomic History  . Marital status: Married    Spouse name: Not on file  . Number of children: Not on file  . Years of education: Not on file  . Highest education level: Not on file  Occupational History  . Not on file  Social Needs  . Financial resource strain: Not on file  . Food insecurity:    Worry: Not on file    Inability: Not on file  . Transportation needs:    Medical: Not on file    Non-medical: Not on file  Tobacco Use  . Smoking status: Never Smoker  . Smokeless tobacco: Never Used  Substance and Sexual Activity  . Alcohol use: No  . Drug use: No  . Sexual activity: Yes    Birth control/protection: I.U.D.  Lifestyle  . Physical activity:    Days per week: Not on file    Minutes per session: Not on file  . Stress: Not on file  Relationships  . Social connections:    Talks on phone: Not on file    Gets together: Not on file    Attends religious service: Not on file    Active member of club or organization: Not on file    Attends meetings of clubs or organizations: Not on file    Relationship status:  Not on file  Other Topics Concern  . Not on file  Social History Narrative  . Not on file   Current Outpatient Medications on File Prior to Visit  Medication Sig Dispense Refill  . acetaminophen (TYLENOL) 325 MG tablet Take 650 mg by mouth every 6 (six) hours as needed (pain).    [name] acetaminophen-codeine (TYLENOL #3) 300-30 MG tablet Take 1-2 tablets by mouth at bedtime as needed for moderate pain. (Patient not taking: Reported on 02/04/2017) 30 tablet 0  . Adalimumab (HUMIRA PEN) 40 MG/0.8ML PNKT Inject 40 mg into the skin every 7 (seven) days.    [name] dicyclomine (BENTYL) 20 MG tablet Take 1 tablet (20 mg total) by mouth 4 (four) times daily -  before meals and at bedtime. (Patient not taking: Reported on 02/04/2017) 120 tablet  2  . ferrous gluconate (FERGON) 324 MG tablet Take 324 mg by mouth daily.    . fluconazole (DIFLUCAN) 200 MG tablet Take 1 tablet (200 mg total) by mouth daily. For two weeks (Patient not taking: Reported on 02/04/2017) 14 tablet 0  . hydrocortisone (CORTENEMA) 100 MG/60ML enema Place 1 enema (100 mg total) rectally at bedtime. (Patient not taking: Reported on 02/04/2017) 420 mL 1  . meloxicam (MOBIC) 7.5 MG tablet Take 1 tablet (7.5 mg total) by mouth daily. (Patient not taking: Reported on 02/04/2017) 30 tablet 0  . ondansetron (ZOFRAN) 4 MG tablet Take 1 tablet (4 mg total) by mouth every 8 (eight) hours as needed for nausea. 20 tablet 0  . Oxycodone HCl 10 MG TABS Take 10 mg by mouth every 4 (four) hours as needed for pain.     No current facility-administered medications on file prior to visit.    OB History  Gravida Para Term Preterm AB Living  1 0 0 0 0 0  SAB TAB Ectopic Multiple Live Births  0 0 0 0 0     Impression/Counseling: Crohn's disease with total colectomy.  In general I reviewed the overall outcomes in women with IBD are good. There is an increased risk of low birth weight, preterm birth however, there are no increased risk for congenital malformations.  Secondly, we discussed that those in remission will likely stay in remission. Therefore I reassured Sara Patel that I suspect that she should remain in her current state. More importantly that she should remain on Humira as those who stop this medication during pregnancy are likely to experience a flare.  I discussed that the medical literature demonstrates little adverse effects on fetal development, thus Humira should be continued in pregnancy.  We recommend vitamin and mineral assessment to include, zink, folate, iron, calcium, Vitamin, A, E, K, and D. Monthly assessments and replacement should be performed.   Sara Patel- currently has anemia which is most common. We recommend IV iron therapy. She has known dextran  allergy. I am uncertain of her response to low molecular weight dextran. However, an alternative treatment is iron gluconate.   Jehovah's Witness with anemia- Hgb 7.5 g/dL- consider IV iron therapy.  Given the above counseling we recommend the following:  1) Serial fetal growth every 4-6 weeks 2) Initiate antenatal testing for FGR 3) Monthly electrolytes and vitamin if abnormal and then every trimester once stable.  I met with Sara Patel for 40 minutes with >50% in face to face consultation and care coordination.  All questions answered.  Vikki Ports, MD

## 2019-05-20 ENCOUNTER — Encounter (HOSPITAL_COMMUNITY): Payer: Self-pay

## 2019-05-26 ENCOUNTER — Inpatient Hospital Stay (HOSPITAL_COMMUNITY)
Admission: AD | Admit: 2019-05-26 | Discharge: 2019-05-26 | Disposition: A | Discharge status: Home / Self Care | Payer: 59 | Attending: Obstetrics & Gynecology | Admitting: Obstetrics & Gynecology

## 2019-05-26 ENCOUNTER — Other Ambulatory Visit: Payer: Self-pay

## 2019-05-26 DIAGNOSIS — O99612 Diseases of the digestive system complicating pregnancy, second trimester: Secondary | ICD-10-CM | POA: Insufficient documentation

## 2019-05-26 DIAGNOSIS — Z933 Colostomy status: Secondary | ICD-10-CM | POA: Insufficient documentation

## 2019-05-26 DIAGNOSIS — K5090 Crohn's disease, unspecified, without complications: Secondary | ICD-10-CM | POA: Diagnosis not present

## 2019-05-26 DIAGNOSIS — Z3A20 20 weeks gestation of pregnancy: Secondary | ICD-10-CM | POA: Diagnosis not present

## 2019-05-26 DIAGNOSIS — N949 Unspecified condition associated with female genital organs and menstrual cycle: Secondary | ICD-10-CM | POA: Diagnosis not present

## 2019-05-26 DIAGNOSIS — O26892 Other specified pregnancy related conditions, second trimester: Secondary | ICD-10-CM | POA: Insufficient documentation

## 2019-05-26 DIAGNOSIS — R109 Unspecified abdominal pain: Secondary | ICD-10-CM | POA: Insufficient documentation

## 2019-05-26 LAB — URINALYSIS, ROUTINE W REFLEX MICROSCOPIC
Bacteria, UA: NONE SEEN
Bilirubin Urine: NEGATIVE
Glucose, UA: NEGATIVE mg/dL
Hgb urine dipstick: NEGATIVE
Ketones, ur: NEGATIVE mg/dL
Nitrite: NEGATIVE
Protein, ur: NEGATIVE mg/dL
Specific Gravity, Urine: 1.008 (ref 1.005–1.030)
pH: 6 (ref 5.0–8.0)

## 2019-05-26 LAB — WET PREP, GENITAL
Clue Cells Wet Prep HPF POC: NONE SEEN
Sperm: NONE SEEN
Trich, Wet Prep: NONE SEEN
Yeast Wet Prep HPF POC: NONE SEEN

## 2019-05-26 MED ORDER — ACETAMINOPHEN 500 MG PO TABS: 1000.0000 mg | ORAL_TABLET | Freq: Once | ORAL | Status: DC

## 2019-05-26 NOTE — MAU Provider Note (Signed)
History     CSN: [id number]  Arrival date and time: 05/26/19 1419   First Provider Initiated Contact with Patient 05/26/19 1538      Chief Complaint  Patient presents with  . Abdominal Pain   HPI Ms. Sara Patel is a 33 y.o. G1P0 at 5944w4d who presents to MAU today with complaint of pelvic cramping since 10 am today. She denies vaginal bleeding, discharge, UTI symptoms or fever. She has Chron's and a colostomy. She rates her pain at 2/10.   OB History    Gravida  1   Para      Term      Preterm      AB      Living        SAB      TAB      Ectopic      Multiple      Live Births              Past Medical History:  Diagnosis Date  . Crohn disease ([facility]) 2000   [facility]  . Pancreatitis     Past Surgical History:  Procedure Laterality Date  . ABDOMINAL SURGERY     2006, 2011 resections   . ILEOSTOMY  2003  . rectal abscess  2014   . TOTAL COLECTOMY      Family History  Problem Relation Age of Onset  . Hypertension Father     Social History   Tobacco Use  . Smoking status: Never Smoker  . Smokeless tobacco: Never Used  Substance Use Topics  . Alcohol use: No  . Drug use: No    Allergies:  Allergies  Allergen Reactions  . Iron Dextran Anaphylaxis    Medications Prior to Admission  Medication Sig Dispense Refill Last Dose  . acetaminophen (TYLENOL) 325 MG tablet Take 650 mg by mouth every 6 (six) hours as needed (pain).     [name]. acetaminophen-codeine (TYLENOL #3) 300-30 MG tablet Take 1-2 tablets by mouth at bedtime as needed for moderate pain. (Patient not taking: Reported on 02/04/2017) 30 tablet 0   . Adalimumab (HUMIRA PEN) 40 MG/0.8ML PNKT Inject 40 mg into the skin every 7 (seven) days.     [name]. dicyclomine (BENTYL) 20 MG tablet Take 1 tablet (20 mg total) by mouth 4 (four) times daily -  before meals and at bedtime. (Patient not taking: Reported on 02/04/2017) 120 tablet 2   . ferrous gluconate (FERGON) 324 MG tablet  Take 324 mg by mouth daily.     . fluconazole (DIFLUCAN) 200 MG tablet Take 1 tablet (200 mg total) by mouth daily. For two weeks (Patient not taking: Reported on 02/04/2017) 14 tablet 0   . hydrocortisone (CORTENEMA) 100 MG/60ML enema Place 1 enema (100 mg total) rectally at bedtime. (Patient not taking: Reported on 02/04/2017) 420 mL 1   . meloxicam (MOBIC) 7.5 MG tablet Take 1 tablet (7.5 mg total) by mouth daily. (Patient not taking: Reported on 02/04/2017) 30 tablet 0   . ondansetron (ZOFRAN) 4 MG tablet Take 1 tablet (4 mg total) by mouth every 8 (eight) hours as needed for nausea. 20 tablet 0   . Oxycodone HCl 10 MG TABS Take 10 mg by mouth every 4 (four) hours as needed for pain.       Review of Systems  Constitutional: Negative for fever.  Gastrointestinal: Positive for abdominal pain. Negative for nausea and vomiting.  Genitourinary: Negative for dysuria, frequency, urgency,  vaginal bleeding and vaginal discharge.   Physical Exam   Blood pressure 115/64, pulse 87, temperature 98.5 F (36.9 C), temperature source Oral, resp. rate 18, height 5\' 4"  (1.626 m), weight 90.2 kg, last menstrual period 01/02/2019, SpO2 100 %.  Physical Exam  Nursing note and vitals reviewed. Constitutional: She is oriented to person, place, and time. She appears well-developed and well-nourished. No distress.  HENT:  Head: Normocephalic and atraumatic.  Cardiovascular: Normal rate.  Respiratory: Effort normal.  GI: Soft. She exhibits no distension and no mass. There is no abdominal tenderness. There is no rebound and no guarding.  Genitourinary: Uterus is enlarged. Uterus is not tender. Cervix exhibits friability (scant bleeding after specimen collection).    Vaginal discharge (small, white) present.     No vaginal bleeding.  No bleeding in the vagina.  Neurological: She is alert and oriented to person, place, and time.  Skin: Skin is warm and dry. No erythema.  Psychiatric: She has a normal mood and  affect.  Dilation: Closed Effacement (%): Thick Cervical Position: Posterior Exam by:: Kerry Hough, PA-C   Results for orders placed or performed during the hospital encounter of 05/26/19 (from the past 24 hour(s))  Urinalysis, Routine w reflex microscopic     Status: Abnormal   Collection Time: 05/26/19  2:50 PM  Result Value Ref Range   Color, Urine STRAW (A) YELLOW   APPearance CLEAR CLEAR   Specific Gravity, Urine 1.008 1.005 - 1.030   pH 6.0 5.0 - 8.0   Glucose, UA NEGATIVE NEGATIVE mg/dL   Hgb urine dipstick NEGATIVE NEGATIVE   Bilirubin Urine NEGATIVE NEGATIVE   Ketones, ur NEGATIVE NEGATIVE mg/dL   Protein, ur NEGATIVE NEGATIVE mg/dL   Nitrite NEGATIVE NEGATIVE   Leukocytes,Ua LARGE (A) NEGATIVE   RBC / HPF 0-5 0 - 5 RBC/hpf   WBC, UA 21-50 0 - 5 WBC/hpf   Bacteria, UA NONE SEEN NONE SEEN   Squamous Epithelial / LPF 0-5 0 - 5   Mucus PRESENT   Wet prep, genital     Status: Abnormal   Collection Time: 05/26/19  3:46 PM  Result Value Ref Range   Yeast Wet Prep HPF POC NONE SEEN NONE SEEN   Trich, Wet Prep NONE SEEN NONE SEEN   Clue Cells Wet Prep HPF POC NONE SEEN NONE SEEN   WBC, Wet Prep HPF POC MANY (A) NONE SEEN   Sperm NONE SEEN     MAU Course  Procedures None  MDM FHR - 159 with doppler UA, Wet prep, GC/Chlamydia today  Urine culture ordered Offered Tylenol for pain. Patient would prefer to take at home after discharge.   Assessment and Plan  A: SIUP at [redacted]w[redacted]d Round ligament pain   P: Discharge home Tylenol PRN for pain  Second trimester precautions discussed Patient advised to follow-up with [facility] as scheduled for routine prenatal care or sooner PRN Patient may return to MAU as needed or if her condition were to change or worsen   Kerry Hough, PA-C 05/26/2019, 4:24 PM

## 2019-05-26 NOTE — Discharge Instructions (Signed)

## 2019-05-26 NOTE — MAU Note (Signed)
RN provider patient with copy of discharge instructions that were reviewed. Questions answered. Patient verbalized understanding. States she discussed pain with provider and will take tylenol at home. Vital signs stable. Patient ambulatory upon discharge. All belongings sent home with patient.

## 2019-05-26 NOTE — MAU Note (Signed)
Sara Patel is a 33 y.o. at [redacted]w[redacted]d here in MAU reporting:  +abominal pain. States feels like menstrual cramps. Onset of complaint: 1030 this morning Pain score: 4/10 Has not tried anything for the pain. States has just tried to drink fluids and rest. Vitals:   05/26/19 1441  BP: 115/64  Pulse: 87  Resp: 18  Temp: 98.5 F (36.9 C)  SpO2: 100%    FHT: 159 via doppler Lab orders placed from triage: ua

## 2019-05-27 ENCOUNTER — Other Ambulatory Visit: Payer: Self-pay | Admitting: Medical

## 2019-05-27 DIAGNOSIS — B951 Streptococcus, group B, as the cause of diseases classified elsewhere: Secondary | ICD-10-CM

## 2019-05-27 DIAGNOSIS — O98819 Other maternal infectious and parasitic diseases complicating pregnancy, unspecified trimester: Secondary | ICD-10-CM

## 2019-05-27 DIAGNOSIS — O2342 Unspecified infection of urinary tract in pregnancy, second trimester: Secondary | ICD-10-CM

## 2019-05-27 LAB — CULTURE, OB URINE: Culture: 30000 — AB

## 2019-05-27 MED ORDER — AMOXICILLIN 500 MG PO CAPS: 500.0000 mg | ORAL_CAPSULE | Freq: Three times a day (TID) | ORAL | 0 refills | Status: DC

## 2019-05-28 LAB — GC/CHLAMYDIA PROBE AMP (~~LOC~~) NOT AT ARMC
Chlamydia: NEGATIVE
Neisseria Gonorrhea: NEGATIVE

## 2019-07-24 LAB — OB RESULTS CONSOLE GBS: GBS: POSITIVE

## 2019-07-26 LAB — OB RESULTS CONSOLE HIV ANTIBODY (ROUTINE TESTING): HIV: NONREACTIVE

## 2019-08-14 ENCOUNTER — Ambulatory Visit (INDEPENDENT_AMBULATORY_CARE_PROVIDER_SITE_OTHER): Payer: Self-pay | Admitting: Pediatrics

## 2019-08-14 ENCOUNTER — Other Ambulatory Visit: Payer: Self-pay

## 2019-08-14 DIAGNOSIS — Z7681 Expectant parent(s) prebirth pediatrician visit: Secondary | ICD-10-CM

## 2019-08-14 NOTE — Progress Notes (Signed)
Prenatal counseling for impending newborn done--1st child, currently 32wks, maternal crohn's.  Mom on humera during pregnancy and advised infant cant have live vaccines for first 6 months.  Prenatal care at 7wks.   Z76.81

## 2019-09-06 ENCOUNTER — Encounter (HOSPITAL_COMMUNITY): Payer: Self-pay

## 2019-09-06 ENCOUNTER — Other Ambulatory Visit: Payer: Self-pay

## 2019-09-06 ENCOUNTER — Inpatient Hospital Stay (HOSPITAL_COMMUNITY)
Admission: AD | Admit: 2019-09-06 | Discharge: 2019-09-07 | Disposition: A | Discharge status: Home / Self Care | Payer: 59 | Attending: Obstetrics & Gynecology | Admitting: Obstetrics & Gynecology

## 2019-09-06 DIAGNOSIS — O99613 Diseases of the digestive system complicating pregnancy, third trimester: Secondary | ICD-10-CM | POA: Insufficient documentation

## 2019-09-06 DIAGNOSIS — Z3689 Encounter for other specified antenatal screening: Secondary | ICD-10-CM

## 2019-09-06 DIAGNOSIS — O4703 False labor before 37 completed weeks of gestation, third trimester: Secondary | ICD-10-CM

## 2019-09-06 DIAGNOSIS — Z79899 Other long term (current) drug therapy: Secondary | ICD-10-CM | POA: Diagnosis not present

## 2019-09-06 DIAGNOSIS — O26893 Other specified pregnancy related conditions, third trimester: Secondary | ICD-10-CM | POA: Diagnosis present

## 2019-09-06 DIAGNOSIS — K5090 Crohn's disease, unspecified, without complications: Secondary | ICD-10-CM | POA: Diagnosis not present

## 2019-09-06 DIAGNOSIS — Z3A35 35 weeks gestation of pregnancy: Secondary | ICD-10-CM | POA: Insufficient documentation

## 2019-09-06 HISTORY — DX: Anxiety disorder, unspecified: F41.9

## 2019-09-06 HISTORY — DX: Anemia, unspecified: D64.9

## 2019-09-06 LAB — URINALYSIS, ROUTINE W REFLEX MICROSCOPIC
Bilirubin Urine: NEGATIVE
Glucose, UA: NEGATIVE mg/dL
Ketones, ur: 5 mg/dL — AB
Nitrite: NEGATIVE
Protein, ur: NEGATIVE mg/dL
Specific Gravity, Urine: 1.003 — ABNORMAL LOW (ref 1.005–1.030)
WBC, UA: >50 WBC/hpf — ABNORMAL HIGH (ref 0–5)
pH: 6 (ref 5.0–8.0)

## 2019-09-06 MED ORDER — NIFEDIPINE 10 MG PO CAPS
10.0000 mg | ORAL_CAPSULE | ORAL | Status: DC | PRN
  Administered 2019-09-06 – 2019-09-07 (×3): 10 mg via ORAL
  Filled 2019-09-06 (×3): qty 1

## 2019-09-06 NOTE — MAU Provider Note (Signed)
History     CSN: [id number]  Arrival date and time: 09/06/19 2237   First Provider Initiated Contact with Patient 09/06/19 2325       Chief Complaint  Patient presents with  . Abdominal Pain   Sara Patel is a 33 y.o. G1P0 at 43w2dwho presents to MAU with complaints of abdominal pain. She reports severe abdominal pain that started occurring last night. Describes the pain as waves of pain that last for more than a minute when it occurs, muscles are pulling apart and tightening. Pain initially started as "menstrual cramping" that has continued to worsen. Rates pain 9.5/10- has taken Tylenol for pain without relief. She reports having tenderness behind her ileostomy - that is worse when baby moves. Denies LOF, vaginal bleeding or vaginal discharge. Was seen in the office yesterday but denies having her cervix checked. Pregnancy is complicated by Crohn's and she is planned to be induced at 39 weeks.   OB History    Gravida  1   Para      Term      Preterm      AB      Living        SAB      TAB      Ectopic      Multiple      Live Births              Past Medical History:  Diagnosis Date  . Anemia   . Anxiety   . [place]   [facility] . Pancreat[facility]     Past Surgical History:  Procedure Laterality Date  . ABDOMINAL SURGERY     2006, 2011 resections   . ILEOSTOMY  2003  . rectal abscess  2014   . TOTAL COLECTOMY      Family History  Problem Relation Age of Onset  . Hypertension Father     Social History   Tobacco Use  . Smoking status: Never Smoker  . Smokeless tobacco: Never Used  Substance Use Topics  . Alcohol use: No  . Drug use: No    Allergies:  Allergies  Allergen Reactions  . Iron Dextran Anaphylaxis    Medications Prior to Admission  Medication Sig Dispense Refill Last Dose  . Adalimumab (HUMIRA PEN) 40 MG/0.8ML PNKT Inject 40 mg into the skin every 7 (seven) days.   Past Week at Unknown  time  . doxylamine, Sleep, (UNISOM) 25 MG tablet Take 25 mg by mouth at bedtime as needed.   09/05/2019 at Unknown time  . omeprazole (PRILOSEC) 20 MG capsule Take 20 mg by mouth daily.   09/05/2019  . Prenatal Vit-Fe Fumarate-FA (PRENATAL MULTIVITAMIN) TABS tablet Take 1 tablet by mouth daily at 12 noon.   09/05/2019 at Unknown time  . acetaminophen (TYLENOL) 325 MG tablet Take 650 mg by mouth every 6 (six) hours as needed (pain).     .Marland Kitchenamoxicillin (AMOXIL) 500 MG capsule Take 1 capsule (500 mg total) by mouth 3 (three) times daily. 21 capsule 0   . dicyclomine (BENTYL) 20 MG tablet Take 1 tablet (20 mg total) by mouth 4 (four) times daily -  before meals and at bedtime. (Patient not taking: Reported on 02/04/2017) 120 tablet 2   . ferrous gluconate (FERGON) 324 MG tablet Take 324 mg by mouth daily.     . ondansetron (ZOFRAN) 4 MG tablet Take 1 tablet (4 mg total) by mouth every 8 (  eight) hours as needed for nausea. 20 tablet 0   . Oxycodone HCl 10 MG TABS Take 10 mg by mouth every 4 (four) hours as needed for pain.       Review of Systems  Constitutional: Negative.   Respiratory: Negative.   Cardiovascular: Negative.   Gastrointestinal: Positive for abdominal pain. Negative for constipation, diarrhea and vomiting.  Genitourinary: Negative.   Musculoskeletal: Negative.   Neurological: Negative.    Physical Exam   Blood pressure 125/72, pulse (!) 101, temperature 98.1 F (36.7 C), resp. rate 18, height 5' 4"  (1.626 m), weight 97.1 kg, last menstrual period 01/02/2019.  Physical Exam  Nursing note and vitals reviewed. Constitutional: She is oriented to person, place, and time. She appears well-developed and well-nourished.  Cardiovascular: Normal rate and regular rhythm.  Respiratory: Effort normal and breath sounds normal.  GI: Soft. There is no rebound and no guarding.  Gravid appropriate for gestational age, moderate contractions palpated  Musculoskeletal: Normal range of motion.         General: Edema present.     Comments: +2 pitting edema, bilaterally   Neurological: She is alert and oriented to person, place, and time. She displays normal reflexes. She exhibits normal muscle tone.  Psychiatric: She has a normal mood and affect. Her behavior is normal. Thought content normal.   Initial assessment:  FHR: 150/moderate/+accels/ no decelerations  Toco: 3-6 minutes, moderate by palpation with UI   Initial cervical examination:  Dilation: Closed Effacement (%): Thick Cervical Position: Posterior Station: -3 Presentation: Vertex Exam by:: Wende Bushy CNM  MAU Course  Procedures  MDM Hydration  UA - shows mild dehydration and large leukocytes with WBC  Procardia x3  Reassessment at 0145: cervix unchanged. Patient reports continued painful contractions that she has to breath through occurring every 5-10 minutes. Rates pain 9/10 - Discussed IV bolus and pain medication through IV to relax uterus and help pain, patient agrees to plan of care - Dr Elonda Husky notified and aware/agrees with plan of care  - Plan to recheck cervix around 0300 - LR bolus and stadol ordered   Reassessment at 0320: cervix unchanged. Patient reports pain and contractions have subsided and she is able to sleep.  No cervical change over 4 hours - okay to discharge home  NST reactive   FHR: 130/moderate/+accels/ no decelerations  Toco: no UC, UI prior to discharge home   Preterm labor precautions discussed with patient and husband. Discussed reasons to return to MAU. Follow up as scheduled in the office. Return to MAU as needed. Pt stable at time of discharge.   Assessment and Plan   1. Threatened preterm labor, third trimester   2. [redacted] weeks gestation of pregnancy   3. NST (non-stress test) reactive    Discharge home Follow up as scheduled in the office for prenatal care Return to MAU as needed for reasons discussed and/or emergencies  Preterm labor precautions discussed  Hydration and rest    Follow-up [place]. Go on 09/10/2019.   Why: Go to appointment as scheduled for prenatal care or return to MAU as needed for reasons discussed Contact information: [address] [place] [address] [phone]         Allergies as of 09/07/2019      Reactions   Iron Dextran Anaphylaxis      Medication List    STOP taking these medications   amoxicillin 500 MG capsule Commonly known as:  AMOXIL   dicyclomine 20 MG tablet Commonly known as: Bentyl   Oxycodone HCl 10 MG Tabs     TAKE these medications   acetaminophen 325 MG tablet Commonly known as: TYLENOL Take 650 mg by mouth every 6 (six) hours as needed (pain).   doxylamine (Sleep) 25 MG tablet Commonly known as: UNISOM Take 25 mg by mouth at bedtime as needed.   ferrous gluconate 324 MG tablet Commonly known as: FERGON Take 324 mg by mouth daily.   Humira Pen 40 MG/0.8ML Pnkt Generic drug: Adalimumab Inject 40 mg into the skin every 7 (seven) days.   omeprazole 20 MG capsule Commonly known as: PRILOSEC Take 20 mg by mouth daily.   ondansetron 4 MG tablet Commonly known as: ZOFRAN Take 1 tablet (4 mg total) by mouth every 8 (eight) hours as needed for nausea.   prenatal multivitamin Tabs tablet Take 1 tablet by mouth daily at 12 noon.       Lajean Manes CNM 09/06/2019, 3:30 AM

## 2019-09-06 NOTE — MAU Note (Signed)
Started having abd THurs. Today feels like muscles are being pulled apart and having cramping. Today having a lot of pain behind my iliostomy. Eating causes more pain and nausea but no vomiting. Denies vag bleeding or LOF. Baby is moving more than usual

## 2019-09-07 MED ORDER — LACTATED RINGERS IV BOLUS
1000.0000 mL | Freq: Once | INTRAVENOUS | Status: AC
  Administered 2019-09-07: 1000 mL via INTRAVENOUS

## 2019-09-07 MED ORDER — BUTORPHANOL TARTRATE 1 MG/ML IJ SOLN
2.0000 mg | Freq: Once | INTRAMUSCULAR | Status: AC
  Administered 2019-09-07: 2 mg via INTRAVENOUS
  Filled 2019-09-07: qty 2

## 2019-09-07 MED ORDER — ONDANSETRON HCL 4 MG/2ML IJ SOLN
4.0000 mg | Freq: Once | INTRAMUSCULAR | Status: AC
  Administered 2019-09-07: 4 mg via INTRAVENOUS
  Filled 2019-09-07: qty 2

## 2019-09-07 MED ORDER — PROMETHAZINE HCL 25 MG/ML IJ SOLN
12.5000 mg | Freq: Once | INTRAMUSCULAR | Status: DC
  Filled 2019-09-07: qty 1

## 2019-09-08 LAB — CULTURE, OB URINE: Culture: 2000 — AB

## 2019-09-12 LAB — OB RESULTS CONSOLE RUBELLA ANTIBODY, IGM: Rubella: IMMUNE

## 2019-09-12 LAB — OB RESULTS CONSOLE HEPATITIS B SURFACE ANTIGEN: Hepatitis B Surface Ag: NEGATIVE

## 2019-09-12 LAB — OB RESULTS CONSOLE HIV ANTIBODY (ROUTINE TESTING): HIV: NONREACTIVE

## 2019-09-13 ENCOUNTER — Encounter (HOSPITAL_COMMUNITY): Payer: Self-pay

## 2019-09-13 ENCOUNTER — Inpatient Hospital Stay (HOSPITAL_COMMUNITY)
Admission: AD | Admit: 2019-09-13 | Discharge: 2019-09-22 | DRG: 786 | Disposition: A | Discharge status: Home / Self Care | Payer: 59 | Attending: Obstetrics & Gynecology | Admitting: Obstetrics & Gynecology

## 2019-09-13 DIAGNOSIS — O2662 Liver and biliary tract disorders in childbirth: Principal | ICD-10-CM | POA: Diagnosis present

## 2019-09-13 DIAGNOSIS — Z9049 Acquired absence of other specified parts of digestive tract: Secondary | ICD-10-CM

## 2019-09-13 DIAGNOSIS — K831 Obstruction of bile duct: Secondary | ICD-10-CM | POA: Diagnosis present

## 2019-09-13 DIAGNOSIS — K5010 Crohn's disease of large intestine without complications: Secondary | ICD-10-CM | POA: Diagnosis present

## 2019-09-13 DIAGNOSIS — O1404 Mild to moderate pre-eclampsia, complicating childbirth: Secondary | ICD-10-CM | POA: Diagnosis present

## 2019-09-13 DIAGNOSIS — D649 Anemia, unspecified: Secondary | ICD-10-CM | POA: Diagnosis present

## 2019-09-13 DIAGNOSIS — O99824 Streptococcus B carrier state complicating childbirth: Secondary | ICD-10-CM | POA: Diagnosis present

## 2019-09-13 DIAGNOSIS — Z3A37 37 weeks gestation of pregnancy: Secondary | ICD-10-CM

## 2019-09-13 DIAGNOSIS — B9689 Other specified bacterial agents as the cause of diseases classified elsewhere: Secondary | ICD-10-CM | POA: Diagnosis present

## 2019-09-13 DIAGNOSIS — O9962 Diseases of the digestive system complicating childbirth: Secondary | ICD-10-CM | POA: Diagnosis present

## 2019-09-13 DIAGNOSIS — O1213 Gestational proteinuria, third trimester: Secondary | ICD-10-CM | POA: Diagnosis present

## 2019-09-13 DIAGNOSIS — Z20828 Contact with and (suspected) exposure to other viral communicable diseases: Secondary | ICD-10-CM | POA: Diagnosis present

## 2019-09-13 DIAGNOSIS — Z933 Colostomy status: Secondary | ICD-10-CM

## 2019-09-13 DIAGNOSIS — Z3490 Encounter for supervision of normal pregnancy, unspecified, unspecified trimester: Secondary | ICD-10-CM | POA: Diagnosis not present

## 2019-09-13 DIAGNOSIS — O1400 Mild to moderate pre-eclampsia, unspecified trimester: Secondary | ICD-10-CM | POA: Diagnosis present

## 2019-09-13 DIAGNOSIS — O9902 Anemia complicating childbirth: Secondary | ICD-10-CM | POA: Diagnosis present

## 2019-09-13 DIAGNOSIS — K5090 Crohn's disease, unspecified, without complications: Secondary | ICD-10-CM | POA: Diagnosis present

## 2019-09-13 DIAGNOSIS — Z531 Procedure and treatment not carried out because of patient's decision for reasons of belief and group pressure: Secondary | ICD-10-CM

## 2019-09-13 DIAGNOSIS — O99892 Other specified diseases and conditions complicating childbirth: Secondary | ICD-10-CM | POA: Diagnosis not present

## 2019-09-13 LAB — CBC
HCT: 32.1 % — ABNORMAL LOW (ref 36.0–46.0)
Hemoglobin: 10.8 g/dL — ABNORMAL LOW (ref 12.0–15.0)
MCH: 29.4 pg (ref 26.0–34.0)
MCHC: 33.6 g/dL (ref 30.0–36.0)
MCV: 87.5 fL (ref 80.0–100.0)
Platelets: 199 10*3/uL (ref 150–400)
RBC: 3.67 MIL/uL — ABNORMAL LOW (ref 3.87–5.11)
RDW: 16 % — ABNORMAL HIGH (ref 11.5–15.5)
WBC: 10.3 10*3/uL (ref 4.0–10.5)
nRBC: 0 % (ref 0.0–0.2)

## 2019-09-13 LAB — COMPREHENSIVE METABOLIC PANEL
ALT: 67 U/L — ABNORMAL HIGH (ref 0–44)
AST: 57 U/L — ABNORMAL HIGH (ref 15–41)
Albumin: 2.6 g/dL — ABNORMAL LOW (ref 3.5–5.0)
Alkaline Phosphatase: 181 U/L — ABNORMAL HIGH (ref 38–126)
Anion gap: 9 (ref 5–15)
BUN: 6 mg/dL (ref 6–20)
CO2: 22 mmol/L (ref 22–32)
Calcium: 9.4 mg/dL (ref 8.9–10.3)
Chloride: 105 mmol/L (ref 98–111)
Creatinine, Ser: 0.82 mg/dL (ref 0.44–1.00)
GFR calc Af Amer: >60 mL/min (ref >60)
GFR calc non Af Amer: >60 mL/min (ref >60)
Glucose, Bld: 93 mg/dL (ref 70–99)
Potassium: 3.5 mmol/L (ref 3.5–5.1)
Sodium: 136 mmol/L (ref 135–145)
Total Bilirubin: 0.3 mg/dL (ref 0.3–1.2)
Total Protein: 6.7 g/dL (ref 6.5–8.1)

## 2019-09-13 LAB — SARS CORONAVIRUS 2 BY RT PCR (HOSPITAL ORDER, PERFORMED IN ~~LOC~~ HOSPITAL LAB): SARS Coronavirus 2: NEGATIVE

## 2019-09-13 LAB — ABO/RH: ABO/RH(D): O POS

## 2019-09-14 ENCOUNTER — Encounter (HOSPITAL_COMMUNITY): Payer: Self-pay

## 2019-09-14 ENCOUNTER — Other Ambulatory Visit: Payer: Self-pay

## 2019-09-14 DIAGNOSIS — O1213 Gestational proteinuria, third trimester: Secondary | ICD-10-CM | POA: Diagnosis present

## 2019-09-14 DIAGNOSIS — O1404 Mild to moderate pre-eclampsia, complicating childbirth: Secondary | ICD-10-CM | POA: Diagnosis present

## 2019-09-14 DIAGNOSIS — Z20828 Contact with and (suspected) exposure to other viral communicable diseases: Secondary | ICD-10-CM | POA: Diagnosis present

## 2019-09-14 DIAGNOSIS — O1400 Mild to moderate pre-eclampsia, unspecified trimester: Secondary | ICD-10-CM | POA: Diagnosis present

## 2019-09-14 DIAGNOSIS — Z933 Colostomy status: Secondary | ICD-10-CM | POA: Diagnosis not present

## 2019-09-14 DIAGNOSIS — K5090 Crohn's disease, unspecified, without complications: Secondary | ICD-10-CM | POA: Diagnosis present

## 2019-09-14 DIAGNOSIS — O2662 Liver and biliary tract disorders in childbirth: Secondary | ICD-10-CM | POA: Diagnosis present

## 2019-09-14 DIAGNOSIS — IMO0001 Reserved for inherently not codable concepts without codable children: Secondary | ICD-10-CM

## 2019-09-14 DIAGNOSIS — O9962 Diseases of the digestive system complicating childbirth: Secondary | ICD-10-CM | POA: Diagnosis present

## 2019-09-14 DIAGNOSIS — Z3A37 37 weeks gestation of pregnancy: Secondary | ICD-10-CM | POA: Diagnosis not present

## 2019-09-14 DIAGNOSIS — O99824 Streptococcus B carrier state complicating childbirth: Secondary | ICD-10-CM | POA: Diagnosis present

## 2019-09-14 DIAGNOSIS — B9689 Other specified bacterial agents as the cause of diseases classified elsewhere: Secondary | ICD-10-CM | POA: Diagnosis present

## 2019-09-14 DIAGNOSIS — D649 Anemia, unspecified: Secondary | ICD-10-CM | POA: Diagnosis present

## 2019-09-14 DIAGNOSIS — O9902 Anemia complicating childbirth: Secondary | ICD-10-CM | POA: Diagnosis present

## 2019-09-14 DIAGNOSIS — K831 Obstruction of bile duct: Secondary | ICD-10-CM | POA: Diagnosis present

## 2019-09-14 DIAGNOSIS — Z531 Procedure and treatment not carried out because of patient's decision for reasons of belief and group pressure: Secondary | ICD-10-CM

## 2019-09-14 LAB — CBC WITH DIFFERENTIAL/PLATELET
Abs Immature Granulocytes: 0.09 10*3/uL — ABNORMAL HIGH (ref 0.00–0.07)
Abs Immature Granulocytes: 0.09 10*3/uL — ABNORMAL HIGH (ref 0.00–0.07)
Basophils Absolute: 0 10*3/uL (ref 0.0–0.1)
Basophils Absolute: 0 10*3/uL (ref 0.0–0.1)
Basophils Relative: 0 %
Basophils Relative: 0 %
Eosinophils Absolute: 0.2 10*3/uL (ref 0.0–0.5)
Eosinophils Absolute: 0.2 10*3/uL (ref 0.0–0.5)
Eosinophils Relative: 3 %
Eosinophils Relative: 2 %
HCT: 30.3 % — ABNORMAL LOW (ref 36.0–46.0)
HCT: 33.9 % — ABNORMAL LOW (ref 36.0–46.0)
Hemoglobin: 10.1 g/dL — ABNORMAL LOW (ref 12.0–15.0)
Hemoglobin: 10.9 g/dL — ABNORMAL LOW (ref 12.0–15.0)
Immature Granulocytes: 1 %
Immature Granulocytes: 1 %
Lymphocytes Relative: 27 %
Lymphocytes Relative: 28 %
Lymphs Abs: 2.5 10*3/uL (ref 0.7–4.0)
Lymphs Abs: 2.8 10*3/uL (ref 0.7–4.0)
MCH: 29.4 pg (ref 26.0–34.0)
MCH: 28.4 pg (ref 26.0–34.0)
MCHC: 33.3 g/dL (ref 30.0–36.0)
MCHC: 32.2 g/dL (ref 30.0–36.0)
MCV: 88.1 fL (ref 80.0–100.0)
MCV: 88.3 fL (ref 80.0–100.0)
Monocytes Absolute: 1.2 10*3/uL — ABNORMAL HIGH (ref 0.1–1.0)
Monocytes Absolute: 1.1 10*3/uL — ABNORMAL HIGH (ref 0.1–1.0)
Monocytes Relative: 12 %
Monocytes Relative: 11 %
Neutro Abs: 5.3 10*3/uL (ref 1.7–7.7)
Neutro Abs: 5.6 10*3/uL (ref 1.7–7.7)
Neutrophils Relative %: 57 %
Neutrophils Relative %: 58 %
Platelets: 174 10*3/uL (ref 150–400)
Platelets: 191 10*3/uL (ref 150–400)
RBC: 3.44 MIL/uL — ABNORMAL LOW (ref 3.87–5.11)
RBC: 3.84 MIL/uL — ABNORMAL LOW (ref 3.87–5.11)
RDW: 16.2 % — ABNORMAL HIGH (ref 11.5–15.5)
RDW: 16.3 % — ABNORMAL HIGH (ref 11.5–15.5)
WBC: 9.3 10*3/uL (ref 4.0–10.5)
WBC: 9.8 10*3/uL (ref 4.0–10.5)
nRBC: 0 % (ref 0.0–0.2)
nRBC: 0 % (ref 0.0–0.2)

## 2019-09-14 LAB — COMPREHENSIVE METABOLIC PANEL
ALT: 58 U/L — ABNORMAL HIGH (ref 0–44)
ALT: 64 U/L — ABNORMAL HIGH (ref 0–44)
AST: 49 U/L — ABNORMAL HIGH (ref 15–41)
AST: 51 U/L — ABNORMAL HIGH (ref 15–41)
Albumin: 2.3 g/dL — ABNORMAL LOW (ref 3.5–5.0)
Albumin: 2.6 g/dL — ABNORMAL LOW (ref 3.5–5.0)
Alkaline Phosphatase: 157 U/L — ABNORMAL HIGH (ref 38–126)
Alkaline Phosphatase: 186 U/L — ABNORMAL HIGH (ref 38–126)
Anion gap: 9 (ref 5–15)
Anion gap: 10 (ref 5–15)
BUN: 6 mg/dL (ref 6–20)
BUN: 6 mg/dL (ref 6–20)
CO2: 19 mmol/L — ABNORMAL LOW (ref 22–32)
CO2: 20 mmol/L — ABNORMAL LOW (ref 22–32)
Calcium: 9.2 mg/dL (ref 8.9–10.3)
Calcium: 9.6 mg/dL (ref 8.9–10.3)
Chloride: 109 mmol/L (ref 98–111)
Chloride: 106 mmol/L (ref 98–111)
Creatinine, Ser: 0.83 mg/dL (ref 0.44–1.00)
Creatinine, Ser: 0.9 mg/dL (ref 0.44–1.00)
GFR calc Af Amer: >60 mL/min (ref >60)
GFR calc Af Amer: >60 mL/min (ref >60)
GFR calc non Af Amer: >60 mL/min (ref >60)
GFR calc non Af Amer: >60 mL/min (ref >60)
Glucose, Bld: 105 mg/dL — ABNORMAL HIGH (ref 70–99)
Glucose, Bld: 93 mg/dL (ref 70–99)
Potassium: 3.5 mmol/L (ref 3.5–5.1)
Potassium: 4 mmol/L (ref 3.5–5.1)
Sodium: 137 mmol/L (ref 135–145)
Sodium: 136 mmol/L (ref 135–145)
Total Bilirubin: 0.4 mg/dL (ref 0.3–1.2)
Total Bilirubin: 0.2 mg/dL — ABNORMAL LOW (ref 0.3–1.2)
Total Protein: 6 g/dL — ABNORMAL LOW (ref 6.5–8.1)
Total Protein: 7 g/dL (ref 6.5–8.1)

## 2019-09-14 LAB — TYPE AND SCREEN
ABO/RH(D): O POS
Antibody Screen: NEGATIVE

## 2019-09-14 LAB — PROTEIN / CREATININE RATIO, URINE
Creatinine, Urine: 85.45 mg/dL
Protein Creatinine Ratio: 0.39 mg/mg{Cre} — ABNORMAL HIGH (ref 0.00–0.15)
Total Protein, Urine: 33 mg/dL

## 2019-09-14 MED ORDER — METRONIDAZOLE 500 MG PO TABS
500.0000 mg | ORAL_TABLET | Freq: Two times a day (BID) | ORAL | Status: AC
  Administered 2019-09-14 – 2019-09-19 (×9): 500 mg via ORAL
  Filled 2019-09-14 (×10): qty 1

## 2019-09-14 MED ORDER — ACETAMINOPHEN 500 MG PO TABS
1000.0000 mg | ORAL_TABLET | Freq: Four times a day (QID) | ORAL | Status: DC | PRN
  Administered 2019-09-14: 1000 mg via ORAL
  Filled 2019-09-14: qty 2

## 2019-09-14 MED ORDER — DIPHENHYDRAMINE HCL 25 MG PO CAPS
50.0000 mg | ORAL_CAPSULE | Freq: Three times a day (TID) | ORAL | Status: DC | PRN
  Administered 2019-09-14 – 2019-09-17 (×5): 50 mg via ORAL
  Filled 2019-09-14 (×5): qty 2

## 2019-09-14 NOTE — Progress Notes (Addendum)
Admission Date: 09/13/2019  7:26 PM  Admit Diagnosis: Proteinuria  Sara Patel is a 33 y.o. female @ 36w 2d. Pt was seen in office today and instructed to come to hospital for observation. Protein/creat ratio in office today 458 w/ elevated liver enzymes: AST and ALT 53/61. Pt has been normotensive. Endorses active FM, denies, ctx, LOF, and vaginal bleeding. C/O occasional itching of palms and soles of feet x 2-3 weeks. Pt is a Jehovah's witness and declines blood transfusion, but has a list of acceptable products.  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 Proteinuria affecting pregnancy, antepartum, third trimester 09/14/2019  . Right kidney mass 02/05/2016  . Frequent UTI 01/18/2016  . History of pancreatitis 09/30/2015  . SI (sacroiliac) pain 09/29/2015  . 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 S/P ileostomy ([place]) 09/07/2015  . Crohn's colitis ([place]) 08/29/2015    Prenatal Labs: ABO, Rh: --/--/O POS, O POS Performed at [facility], [place] [address], [place], [place] [address]  [phone] 2053) Antibody: NEG (10/23 2053) Rubella:  Immune  RPR:   Non-reactive HBsAg:   Negative HIV:   Non-reactive GTT: Normal GBS:   Positive GC/CHL: Negative Genetics: [place]--Low-risk female  OB History  Gravida Para Term Preterm AB Living  1            SAB TAB Ectopic Multiple Live Births               # Outcome Date GA Lbr Len/2nd Weight Sex Delivery Anes PTL Lv  1 Current             Medical / Surgical History: Past medical history:  Past Medical History:  Diagnosis Date  . Anemia   . Anxiety   . Crohn disease ([place]) 2000   Diagnosed in 2000. GI at [facility]  . Pancreatitis     Past surgical history:  Past Surgical History:  Procedure Laterality Date  .  ABDOMINAL SURGERY     2006, 2011 resections   . ILEOSTOMY  2003  . rectal abscess  2014   . TOTAL COLECTOMY     Family History:  Family History  Problem Relation Age of Onset  . Hypertension Father     Social History:  reports that she has never smoked. She has never used smokeless tobacco. She reports that she does not drink alcohol or use drugs. Pt is a Jehovah's witness and declines blood products.   Allergies: Iron dextran   Current Medications at time of admission:  Prior to Admission medications   Medication Sig Start Date End Date Taking? Authorizing Provider  acetaminophen (TYLENOL) 325 MG tablet Take 650 mg by mouth every 6 (six) hours as needed (pain).    [provider]  Adalimumab (HUMIRA PEN) 40 MG/0.8ML PNKT Inject 40 mg into the skin every 7 (seven) days. 08/10/15   [provider]  doxylamine, Sleep, (UNISOM) 25 MG tablet Take 25 mg by mouth at bedtime as needed.    [provider]  ferrous gluconate (FERGON) 324 MG tablet Take 324 mg by mouth daily. 08/04/15 08/03/16  [provider]  omeprazole (PRILOSEC) 20 MG capsule Take 20 mg by mouth daily.    [provider]  ondansetron (ZOFRAN) 4 MG tablet Take 1 tablet (  4 mg total) by mouth every 8 (eight) hours as needed for nausea. 03/14/13   Janece Canterbury, MD  Prenatal Vit-Fe Fumarate-FA (PRENATAL MULTIVITAMIN) TABS tablet Take 1 tablet by mouth daily at 12 noon.    [provider]    Review of Systems: Constitutional: Negative.   HENT: Negative.   Eyes: Negative.   Respiratory: Negative.   Cardiovascular: Negative.   Gastrointestinal: Permanent left colectomy   Genitourinary: Negative for vaginal bleeding and LOF   Musculoskeletal: Negative.   Skin: Negative.   Neurological: Negative.   Endo/Heme/Allergies: Negative.   Psychiatric/Behavioral: Negative.    Physical Exam: VS: Blood pressure 119/68, pulse 81, temperature 98.3 F (36.8 C), temperature source  Oral, resp. rate 18, height 5' 4"  (1.626 m), weight 97.1 kg, last menstrual period 01/02/2019, SpO2 100 %. AAO x3, no signs of distress Cardiovascular: RRR Respiratory: Lung fields clear with ausculation GU/GI: Abdomen gravid, non-tender, non-distended, active FM, vertex and approx 6# 8oz per Leopold's. Colostomy  Extremities: 2+ edema, negative for pain, tenderness, and cords Neuro: DTR's +2, no clonus  Cervical exam:  Deferred, not in labor FHR: NST reactive TOCO: no ctx  CBC    Component Value Date/Time   WBC 10.3 09/13/2019 2053   RBC 3.67 (L) 09/13/2019 2053   HGB 10.8 (L) 09/13/2019 2053   HCT 32.1 (L) 09/13/2019 2053   PLT 199 09/13/2019 2053   MCV 87.5 09/13/2019 2053   MCH 29.4 09/13/2019 2053   MCHC 33.6 09/13/2019 2053   RDW 16.0 (H) 09/13/2019 2053   LYMPHSABS 2.3 02/16/2017 1525   MONOABS 1.1 (H) 02/16/2017 1525   EOSABS 0.1 02/16/2017 1525   BASOSABS 0.0 02/16/2017 1525   CMP     Component Value Date/Time   NA 136 09/13/2019 2053   K 3.5 09/13/2019 2053   CL 105 09/13/2019 2053   CO2 22 09/13/2019 2053   GLUCOSE 93 09/13/2019 2053   BUN 6 09/13/2019 2053   CREATININE 0.82 09/13/2019 2053   CREATININE 0.86 09/29/2015 1138   CALCIUM 9.4 09/13/2019 2053   PROT 6.7 09/13/2019 2053   ALBUMIN 2.6 (L) 09/13/2019 2053   AST 57 (H) 09/13/2019 2053   ALT 67 (H) 09/13/2019 2053   ALKPHOS 181 (H) 09/13/2019 2053   BILITOT 0.3 09/13/2019 2053   GFRNONAA >60 09/13/2019 2053   GFRNONAA >89 09/29/2015 1138   GFRAA >60 09/13/2019 2053   GFRAA >89 09/29/2015 1138   PCR 0.39  Prenatal Transfer Tool  Maternal Diabetes: No Genetic Screening: Normal Maternal Ultrasounds/Referrals: Normal Fetal Ultrasounds or other Referrals:  Referred to Materal Fetal Medicine  Maternal Substance Abuse:  No Significant Maternal Medications:  Meds include: Other:  IV Fereheme, cholecalciferol (vitamin D3), Humira(CF) Pen Significant Maternal Lab Results: Group B Strep  positive    Assessment/Plan: 33 y.o. G1P0 75w3dProteinuria     -Admit to OB Specialty for Obs     -Rpt CBC, CMP in AM     -MFM consult in AM  GHTN vs Pre-eclampsia  Suspect cholestasis     -draw bile salts  GBS positive     -prophylaxis in active labor Jehovah's witness     -declines blood transfusion, has a list of acceptable alternatives that includes Cryoprecipitate  Assessment and Plan reviewed w/Dr DJethro BastosCNM, MSN 09/14/2019

## 2019-09-14 NOTE — Progress Notes (Signed)
Discussed MFM recommendations for IOL @ 37 weeks. Currently 36w 3d, will be 37w 0d on 09/18/19 @ midnight. Pt and spouse agreeable to IOL. Option to remain in-patient or to D/C home w/ close follow-up discussed including risks and benefits of both. Pt and spouse agree to remain in the hospital until IOL. Multiple questions answered.   Pt is a Jehovah's Witness and will provide list of acceptable alternatives to blood transfusion.  Dr. Mancel Bale notified of decision.  Burman Foster, MSN, CNM 09/14/2019 6:04 PM

## 2019-09-14 NOTE — Progress Notes (Signed)
Dr. Shon Baton note reviewed.  Options discussed with pt, please see VG,CNM note.  Will plan to observe in-house and deliver at 37 wks with the presumption of cholestasis of pregnancy and possibly moving in the direction of preeclampsia/HELLP syndrome.  Dr. Shon Baton input appreciated.

## 2019-09-14 NOTE — Progress Notes (Signed)
Sara Patel is a 33 y.o. G1P0 at 87w3dbeing observed for atypical pre-eclampsia w/ proteinuria and elevated liver enzymes w/o neuro features. Pt is a Jehovah's witness and refuses blood products, will accept cryoprecipitate.   Subjective:  Reports a restless night due to itching of palms and soles of feet. Itching resolved with p.o. Benadryl. Denies HA, visual changes, RUQ pain.  Objective: BP 131/77 (BP Location: Right Arm)   Pulse 86   Temp 98.5 F (36.9 C) (Oral)   Resp 18   Ht 5' 4"  (1.626 m)   Wt 97.1 kg   LMP 01/02/2019   SpO2 99%   BMI 36.74 kg/m  No intake/output data recorded. No intake/output data recorded.  FHT: NST Q shift UC:   none SVE:     Deferred, not in labor Labs: Lab Results  Component Value Date   WBC 9.3 09/14/2019   HGB 10.1 (L) 09/14/2019   HCT 30.3 (L) 09/14/2019   MCV 88.1 09/14/2019   PLT 174 09/14/2019   CMP     Component Value Date/Time   NA 137 09/14/2019 0648   K 3.5 09/14/2019 0648   CL 109 09/14/2019 0648   CO2 19 (L) 09/14/2019 0648   GLUCOSE 105 (H) 09/14/2019 0648   BUN 6 09/14/2019 0648   CREATININE 0.83 09/14/2019 0648   CREATININE 0.86 09/29/2015 1138   CALCIUM 9.2 09/14/2019 0648   PROT 6.0 (L) 09/14/2019 0648   ALBUMIN 2.3 (L) 09/14/2019 0648   AST 49 (H) 09/14/2019 0648   ALT 58 (H) 09/14/2019 0648   ALKPHOS 157 (H) 09/14/2019 0648   BILITOT 0.4 09/14/2019 0648   GFRNONAA >60 09/14/2019 0648   GFRNONAA >89 09/29/2015 1138   GFRAA >60 09/14/2019 0648   GFRAA >89 09/29/2015 1138    Assessment / Plan: 33y.o. G1P0 375w3droteinuria Preeclampsia--atypical presentation w/o severe features     -MFM consult today Suspect cholestasis     -bile salts pending Jehovah's witness     -declines blood products GBS positive     -prophylaxis in active labor   Assessment and Plan reviewed w/Dr. RoEtheleen NicksSN, CNM 09/14/2019

## 2019-09-14 NOTE — Consult Note (Addendum)
MFM TELEMEDICINE CONSULTATION  Requesting provider: Everett Graff, MD Date of Service: 09/14/19 Reason for request: Elevated LFT's and proteinuria  Sara Patel is a 33 yo G1P0 at 84 w 3 day who is being seen in consultation at the request of Dr. Mancel Bale for management of elevated LFT's and proteinuria.  Sara Patel has known Crohn's disease and is a Jehovah Witness. A prior consultation regarding these issues was performed and there is no change to prior recommendations.  She presented with new onset proteinuria in clinic and noted to have elevated proteinura via a UPC per Dr. Mancel Bale of 0.45 and 0.3, and on this admission a UPC of .39 was noted. She was sent to MAU further evaluation and noted to have mildly elevated LFT's < 2.5 x normal and trending downward.. She is overall asymptomatic with exception for increased pruritis of the palms of her hands and soles over feet. She denies headache, vision changes or right upper quadrant pain. Her blood pressure is normal.   She also has significant leg swelling.   Her prior additional labs note some 2000 colonies of group B strep.  Fetal status is Category 1.  Vitals with BMI 09/14/2019 09/14/2019 09/14/2019  Height - - -  Weight - - -  BMI - - -  Systolic 592 924 462  Diastolic 85 77 66  Pulse 82 86 90    CBC Latest Ref Rng & Units 09/14/2019 09/14/2019 09/13/2019  WBC 4.0 - 10.5 K/uL 9.8 9.3 10.3  Hemoglobin 12.0 - 15.0 g/dL 10.9(L) 10.1(L) 10.8(L)  Hematocrit 36.0 - 46.0 % 33.9(L) 30.3(L) 32.1(L)  Platelets 150 - 400 K/uL 191 174 199      CMP Latest Ref Rng & Units 09/14/2019 09/13/2019 02/16/2017  Glucose 70 - 99 mg/dL 105(H) 93 109(H)  BUN 6 - 20 mg/dL 6 6 <5(L)  Creatinine 0.44 - 1.00 mg/dL 0.83 0.82 0.95  Sodium 135 - 145 mmol/L 137 136 134(L)  Potassium 3.5 - 5.1 mmol/L 3.5 3.5 3.2(L)  Chloride 98 - 111 mmol/L 109 105 101  CO2 22 - 32 mmol/L 19(L) 22 23  Calcium 8.9 - 10.3 mg/dL 9.2 9.4 8.6(L)  Total Protein 6.5 - 8.1 g/dL  6.0(L) 6.7 8.8(H)  Total Bilirubin 0.3 - 1.2 mg/dL 0.4 0.3 0.3  Alkaline Phos 38 - 126 U/L 157(H) 181(H) 67  AST 15 - 41 U/L 49(H) 57(H) 19  ALT 0 - 44 U/L 58(H) 67(H) 16    OB History  Gravida Para Term Preterm AB Living  1            SAB TAB Ectopic Multiple Live Births               # Outcome Date GA Lbr Len/2nd Weight Sex Delivery Anes PTL Lv  1 Current            Past Medical History:  Diagnosis Date  . Anemia   . Anxiety   . Crohn disease ([place]) 2000   Diagnosed in 2000. GI at [facility]  . Pancreatitis    Past Surgical History:  Procedure Laterality Date  . ABDOMINAL SURGERY     2006, 2011 resections   . ILEOSTOMY  2003  . rectal abscess  2014   . TOTAL COLECTOMY     Social History   Socioeconomic History  . Marital status: Married    Spouse name: Not on file  . Number of children: Not on file  . Years of education: Not on file  . Highest  education level: Not on file  Occupational History  . Not on file  Social Needs  . Financial resource strain: Not on file  . Food insecurity    Worry: Not on file    Inability: Not on file  . Transportation needs    Medical: Not on file    Non-medical: Not on file  Tobacco Use  . Smoking status: Never Smoker  . Smokeless tobacco: Never Used  Substance and Sexual Activity  . Alcohol use: No  . Drug use: No  . Sexual activity: Yes  Lifestyle  . Physical activity    Days per week: Not on file    Minutes per session: Not on file  . Stress: Not on file  Relationships  . Social Herbalist on phone: Not on file    Gets together: Not on file    Attends religious service: Not on file    Active member of club or organization: Not on file    Attends meetings of clubs or organizations: Not on file    Relationship status: Not on file  . Intimate partner violence    Fear of current or ex partner: Not on file    Emotionally abused: Not on file    Physically abused: Not on file    Forced sexual activity: Not on file   Other Topics Concern  . Not on file  Social History Narrative  . Not on file   No current facility-administered medications on file prior to encounter.    Current Outpatient Medications on File Prior to Encounter  Medication Sig Dispense Refill  . acetaminophen (TYLENOL) 325 MG tablet Take 650 mg by mouth every 6 (six) hours as needed (pain).    . Adalimumab (HUMIRA PEN) 40 MG/0.8ML PNKT Inject 40 mg into the skin every 7 (seven) days.    [name] doxylamine, Sleep, (UNISOM) 25 MG tablet Take 25 mg by mouth at bedtime as needed.    . ferrous gluconate (FERGON) 324 MG tablet Take 324 mg by mouth daily.    [name] omeprazole (PRILOSEC) 20 MG capsule Take 20 mg by mouth daily.    . ondansetron (ZOFRAN) 4 MG tablet Take 1 tablet (4 mg total) by mouth every 8 (eight) hours as needed for nausea. 20 tablet 0  . Prenatal Vit-Fe Fumarate-FA (PRENATAL MULTIVITAMIN) TABS tablet Take 1 tablet by mouth daily at 12 noon.      Allergies  Allergen Reactions  . Iron Dextran Anaphylaxis   Family History  Problem Relation Age of Onset  . Hypertension Father    Fetal imaging:Notes  Impression/Counseling:  I met with Sara Patel via telephone consultation and reviewed her records and discussed records not available in Epic with Dr. Mancel Bale.  There was a prior note by Burman Foster, CNM suggesting elevated proteinuria and LFT's at 24 weeks but Dr. Mancel Bale in review of these records could not confirm this information. Sara Patel also was not aware of this.  I explained that given Sara Patel presentation I do not think that this is HELLP syndrome given the levels of the LFT, their trend downward and Sara Patel symptoms. The etiology of the proteinuria is unclear at this time but in the context of overall normal blood pressure this unlikely preeclampsia. She did have a recent 140 SBP but normal < 90 DBP.   However, I did convey that she may be trending toward this diagnosis.  I explained that more likely this is  consistent with diagnosis of  cholestasis especially given her pruritis pattern. A mild elevation of LFT's is also commonly found with this diagnosis. Bile acids were drawn and are pending at this time.  Given the above findings I recommend delivery by 37 weeks.  I discussed with Dr. Mancel Bale various forms of management are reasonable including trending current labs for 24 additional hours including blood pressures. If current status persist consider discharge with close outpatient monitoring vs remaining in hospital until 37 weeks.  Mode of delivery via IOL of is preferable and with reserving cesarean delivery for obstetric indications.  Sara Patel is a Jehovah's Witness, she has been counseled and her preferences for blood products have been documented with regards to management of resucitation in the case of blood loss.  I discussed the plan as detailed in this note with Dr. Mancel Bale, Sara Patel and her significant other.  I spent 50 minutes with >50% in face to face consultation and care coordination.  All questions answered.  Vikki Ports, MD.

## 2019-09-14 NOTE — Progress Notes (Signed)
Contact with Dr. Gertie Exon for MFM consult. Dr. Gertie Exon to review. Will continue to monitor. Toya Smothers, RN

## 2019-09-14 NOTE — Progress Notes (Signed)
S: C/O abd cramping and tightening, no apparent distress  O: Vitals:   09/14/19 1144 09/14/19 1145  BP: 131/77   Pulse: 86   Resp: 18   Temp: 98.5 F (36.9 C)   SpO2: 100% 99%   NST- reactive Abd- soft, irregular ctx per TOCO  A/P: [address] ctx Advised PO hydration Call CNM if ctx return   Burman Foster, MSN, CNM 09/14/2019 3:38 PM

## 2019-09-15 LAB — CBC WITH DIFFERENTIAL/PLATELET
Abs Immature Granulocytes: 0.12 10*3/uL — ABNORMAL HIGH (ref 0.00–0.07)
Basophils Absolute: 0 10*3/uL (ref 0.0–0.1)
Basophils Relative: 0 %
Eosinophils Absolute: 0.2 10*3/uL (ref 0.0–0.5)
Eosinophils Relative: 3 %
HCT: 31.8 % — ABNORMAL LOW (ref 36.0–46.0)
Hemoglobin: 10.3 g/dL — ABNORMAL LOW (ref 12.0–15.0)
Immature Granulocytes: 1 %
Lymphocytes Relative: 26 %
Lymphs Abs: 2.4 10*3/uL (ref 0.7–4.0)
MCH: 28.6 pg (ref 26.0–34.0)
MCHC: 32.4 g/dL (ref 30.0–36.0)
MCV: 88.3 fL (ref 80.0–100.0)
Monocytes Absolute: 1.1 10*3/uL — ABNORMAL HIGH (ref 0.1–1.0)
Monocytes Relative: 12 %
Neutro Abs: 5.3 10*3/uL (ref 1.7–7.7)
Neutrophils Relative %: 58 %
Platelets: 179 10*3/uL (ref 150–400)
RBC: 3.6 MIL/uL — ABNORMAL LOW (ref 3.87–5.11)
RDW: 16.3 % — ABNORMAL HIGH (ref 11.5–15.5)
WBC: 9.1 10*3/uL (ref 4.0–10.5)
nRBC: 0 % (ref 0.0–0.2)

## 2019-09-15 LAB — COMPREHENSIVE METABOLIC PANEL
ALT: 54 U/L — ABNORMAL HIGH (ref 0–44)
AST: 42 U/L — ABNORMAL HIGH (ref 15–41)
Albumin: 2.4 g/dL — ABNORMAL LOW (ref 3.5–5.0)
Alkaline Phosphatase: 157 U/L — ABNORMAL HIGH (ref 38–126)
Anion gap: 7 (ref 5–15)
BUN: <5 mg/dL — ABNORMAL LOW (ref 6–20)
CO2: 22 mmol/L (ref 22–32)
Calcium: 9.2 mg/dL (ref 8.9–10.3)
Chloride: 108 mmol/L (ref 98–111)
Creatinine, Ser: 0.86 mg/dL (ref 0.44–1.00)
GFR calc Af Amer: >60 mL/min (ref >60)
GFR calc non Af Amer: >60 mL/min (ref >60)
Glucose, Bld: 85 mg/dL (ref 70–99)
Potassium: 4 mmol/L (ref 3.5–5.1)
Sodium: 137 mmol/L (ref 135–145)
Total Bilirubin: 0.3 mg/dL (ref 0.3–1.2)
Total Protein: 6.1 g/dL — ABNORMAL LOW (ref 6.5–8.1)

## 2019-09-15 MED ORDER — ONDANSETRON HCL 4 MG PO TABS
4.0000 mg | ORAL_TABLET | Freq: Four times a day (QID) | ORAL | Status: DC | PRN
  Administered 2019-09-15 – 2019-09-17 (×3): 4 mg via ORAL
  Filled 2019-09-15 (×3): qty 1

## 2019-09-15 NOTE — Progress Notes (Signed)
Phlebotomy called to confirm if labs drawn. Phlebotomist states she will come to unit soon to draw labs.

## 2019-09-15 NOTE — Progress Notes (Signed)
Phlebotomy at bedside to draw 6am labs.

## 2019-09-15 NOTE — Progress Notes (Addendum)
Sara Patel LOS# 1, is a 33 y.o. G1P0 at [redacted]w[redacted]d was admitted for r/o preeclampsia without severe features, admitted dx was cholestasis of pregnancy, gestational proteinuria, with elevated LFT, anemia, chron's, bacterial vaginosis. Pt in house until IOL per MFM on 28th already scheduled.Pt is a Jehovah's witness and refuses blood products, will accept cryoprecipitate, products that can be recieved are on file now.   Subjective: Pt in room and stable, pt denies HA, RUQ pain or vision changes as of now, but endorses having mild HA last night that resolved with tylenol. Itching of palm and feet have resolved and much better now with benadryl. Pt denies abdominal issues, denies diarreaha, cp or sob, pt however c/o nausea this morning and vomited x1. RN reported, Zofran ordered. I reviewed the plan of care with the pt and pt report understanding and willing to stay until IOL set in the book for the 28th.   Objective: BP 131/74   Pulse 84   Temp 98.3 F (36.8 C) (Oral)   Resp 16   Ht 5\' 4"  (1.626 m)   Wt 97.1 kg   LMP 01/02/2019   SpO2 100%   BMI 36.74 kg/m  No intake/output data recorded. No intake/output data recorded. AAO x3, no signs of distress Cardiovascular: RRR Respiratory: Lung fields clear with ausculation GU/GI: Abdomen gravid, non-tender, non-distended, active FM, vertex and approx 6# 8oz per Leopold's. Colostomy  Extremities: 2+ edema, negative for pain, tenderness, and cords Neuro: DTR's +2, no clonus UC:   Irregular/occ lasting 40-60 second  SVE:     Deferred, not in labor NST: baseline 145s, moderate variability, no decels noted, +acells, Cat 1, reactive NST.   Labs: Lab Results  Component Value Date   WBC 9.1 09/15/2019   HGB 10.3 (L) 09/15/2019   HCT 31.8 (L) 09/15/2019   MCV 88.3 09/15/2019   PLT 179 09/15/2019   CMP     Component Value Date/Time   NA 137 09/15/2019 0817   K 4.0 09/15/2019 0817   CL 108 09/15/2019 0817   CO2 22 09/15/2019 0817   GLUCOSE 85  09/15/2019 0817   BUN <5 (L) 09/15/2019 0817   CREATININE 0.86 09/15/2019 0817   CREATININE 0.86 09/29/2015 1138   CALCIUM 9.2 09/15/2019 0817   PROT 6.1 (L) 09/15/2019 0817   ALBUMIN 2.4 (L) 09/15/2019 0817   AST 42 (H) 09/15/2019 0817   ALT 54 (H) 09/15/2019 0817   ALKPHOS 157 (H) 09/15/2019 0817   BILITOT 0.3 09/15/2019 0817   GFRNONAA >60 09/15/2019 0817   GFRNONAA >89 09/29/2015 1138   GFRAA >60 09/15/2019 0817   GFRAA >89 09/29/2015 1138    Assessment / Plan: Sara Patel LOS# 1, is a 33 y.o. G1P0 at [redacted]w[redacted]d was admitted for r/o preeclampsia without severe features, admitted dx was cholestasis of pregnancy, gestational proteinuria, with elevated LFT, anemia, chron's, bacterial vaginosis. Pt in house until IOL per MFM on 28th already scheduled.Pt is a Jehovah's witness and refuses blood products, will accept cryoprecipitate, products that can be recieved are on file now.   Nausea: active N/V x1, ordered zofran 4mg  ODT, encouraged to stay hydrated.   Gestational proteinuria: PCR 0.39 on 10/23, otherwise stable, will continue to watch for the development of preeclampsia. Creatinine (0.82-0.83-0.90-0.86), BP normotensive @ 131/74. Denies s/sx of preE.   Cholestasis: resolved urtica of hands and feet with benadryl, elevated but stable LFT's (AST [phone]), (ALT [phone]), bile salts pending. MFM consult recommends dx with cholesteatosis.   BV: Denies s/sx, started  on flagyl 500mg  BID x7 days, today day #3.    GBS+: Pt had +GBS in urine culture 10/1 in office, but plan to administer prophylactic penicillin during IOL.  Chron's: colostomy bag in place, no complaints, stable.   Anemic: HGB ( 10.8-10.1-10.9-10.3), Plat [phone]), required iron infusion during labor with ferriheme, anaphylactic reaction to iron dextran. Monitor, administer ferriheme if indicated.   Jehovah's witness: Does except some products, list given to anesthesia, LD, and in call room, LD to put in  chart. Acceptable products: albumin, immunoglobulins, re-clotting agents, epo therapy, cell savage, hemodilution, dialysis, ept or any form not listed above, cryoprecipitate.   Fetal well-being: NST reactive, EFW was 64% on 10/1, plan to verify presentation prior to IOL for cholestasis, set 28th October, LD aware, until then [facility] NST.    Assessment and Plan reviewed w/Dr. ALTUS LUMBERTON LP, will report off at 1900 to [facility] and Dr [facility] when they assume care tonight.   [facility] MSN, [facility] 09/15/2019

## 2019-09-15 NOTE — Progress Notes (Signed)
Labs sent to lab by phlebotomist via pneumatic tube station.

## 2019-09-16 ENCOUNTER — Other Ambulatory Visit (HOSPITAL_COMMUNITY): Payer: 59 | Attending: Obstetrics and Gynecology

## 2019-09-16 ENCOUNTER — Telehealth (HOSPITAL_COMMUNITY): Payer: Self-pay | Admitting: *Deleted

## 2019-09-16 LAB — BILE ACIDS, TOTAL: Bile Acids Total: 7 umol/L (ref 0.0–10.0)

## 2019-09-16 NOTE — Telephone Encounter (Signed)
Preadmission screen  

## 2019-09-16 NOTE — Progress Notes (Signed)
Sara Patel LOS# 3, is a 33 y.o. G1P0 at 5w5dwas admitted for r/o preeclampsia without severe features, admitted dx was cholestasis of pregnancy, gestational proteinuria, with elevated LFT, anemia, chron's, bacterial vaginosis. Pt in house until IOL @ 371w0der MFM. Pt is a Jehovah's witness and refuses blood products, will accept cryoprecipitate, products that can be recieved are on file now.   Subjective Reports a restful night w/ mild itching on palms and soles. Itching relieved w/ Benadryl. Reports active FM, denies ctx, vaginal bleeding of LOF. States that is no longer anxious about IOL and looking forward to it.   Objective: BP 119/72 (BP Location: Right Arm)   Pulse 70   Temp 98.4 F (36.9 C) (Oral)   Resp 14   Ht 5' 4"  (1.626 m)   Wt 97.1 kg   LMP 01/02/2019   SpO2 99%   BMI 36.74 kg/m  No intake/output data recorded. No intake/output data recorded.  AAO x3, no signs of distress Cardiovascular: RRR Respiratory: Lung fields clear with ausculation GU/GI: Abdomen gravid, non-tender, non-distended, active FM, vertex and approx 6# 8oz per Leopold's. Colostomy present Extremities: 2+ edema, negative for pain, tenderness, and cords Neuro: DTR's +2, no clonus NST reactive- Baseline 150, mo variability, accels present, decels absent  Labs: CBC    Component Value Date/Time   WBC 9.1 09/15/2019 0817   RBC 3.60 (L) 09/15/2019 0817   HGB 10.3 (L) 09/15/2019 0817   HCT 31.8 (L) 09/15/2019 0817   PLT 179 09/15/2019 0817   MCV 88.3 09/15/2019 0817   MCH 28.6 09/15/2019 0817   MCHC 32.4 09/15/2019 0817   RDW 16.3 (H) 09/15/2019 0817   LYMPHSABS 2.4 09/15/2019 0817   MONOABS 1.1 (H) 09/15/2019 0817   EOSABS 0.2 09/15/2019 0817   BASOSABS 0.0 09/15/2019 0817   CMP     Component Value Date/Time   NA 137 09/15/2019 0817   K 4.0 09/15/2019 0817   CL 108 09/15/2019 0817   CO2 22 09/15/2019 0817   GLUCOSE 85 09/15/2019 0817   BUN <5 (L) 09/15/2019 0817   CREATININE 0.86  09/15/2019 0817   CREATININE 0.86 09/29/2015 1138   CALCIUM 9.2 09/15/2019 0817   PROT 6.1 (L) 09/15/2019 0817   ALBUMIN 2.4 (L) 09/15/2019 0817   AST 42 (H) 09/15/2019 0817   ALT 54 (H) 09/15/2019 0817   ALKPHOS 157 (H) 09/15/2019 0817   BILITOT 0.3 09/15/2019 0817   GFRNONAA >60 09/15/2019 0817   GFRNONAA >89 09/29/2015 1138   GFRAA >60 09/15/2019 0817   GFRAA >89 09/29/2015 1138    Assessment / Plan: Proteinuria     -LFT's stable Cholestasis     -bile acids still pending Jehovah's witness     -declines blood products, list of acceptable alternatives on chart GBSpositive -prophylaxis in active labor BV     -continue Flagyl Anemia     -stable Chron's disease w/ colostomy     -stable  Plan reviewed w/ Dr. RoEtheleen NicksSN, CNM 09/16/2019, 5:37 AM

## 2019-09-16 NOTE — Progress Notes (Addendum)
Hospital day # 3 pregnancy at 45w5dcholestasis of pregnancy-.  S:  Pt doing well.  Denies headache. Does complain of itching but reports that Benadryl helps.  FM+.  Complains of heartburn 8 out of 10.      Perception of contractions: none      Vaginal bleeding: none now       Vaginal discharge:  none  O: BP 135/77 (BP Location: Right Arm)   Pulse 77   Temp 98.2 F (36.8 C) (Oral)   Resp 18   Ht 5' 4"  (1.626 m)   Wt 97.1 kg   LMP 01/02/2019   SpO2 99%   BMI 36.74 kg/m       Fetal tracings: FHT 145 accels noted no decels      Contractions:   None      Uterus gravid and non-tender colostomy present      Extremities: extremities normal, atraumatic, no cyanosis or edema and no significant edema and no signs of DVT          Labs:     10/ 25 AST 42 ALT 54, Bile Acids 7.0 HH 10.3/31.8 plt 179, UPC .354Meds: See MAR  A: 359w5dith Cholestasis of pregnancy     Stable -LFT elevated but stable with BA 7 Pepcid 20 mg PO now Repeat CMP -plan on induction at 37 weeks Cat 1 strip Jehovah Witness -Declines blood products BV -On Flagyl GBS positive  -Treat in labor  NaStarla LinkNM, MSN 09/16/2019 10:54 PM  Agree with above.  Questions answered.

## 2019-09-17 LAB — CBC
HCT: 32.2 % — ABNORMAL LOW (ref 36.0–46.0)
Hemoglobin: 10.6 g/dL — ABNORMAL LOW (ref 12.0–15.0)
MCH: 29.1 pg (ref 26.0–34.0)
MCHC: 32.9 g/dL (ref 30.0–36.0)
MCV: 88.5 fL (ref 80.0–100.0)
Platelets: 183 10*3/uL (ref 150–400)
RBC: 3.64 MIL/uL — ABNORMAL LOW (ref 3.87–5.11)
RDW: 16.1 % — ABNORMAL HIGH (ref 11.5–15.5)
WBC: 9.5 10*3/uL (ref 4.0–10.5)
nRBC: 0 % (ref 0.0–0.2)

## 2019-09-17 LAB — COMPREHENSIVE METABOLIC PANEL
ALT: 46 U/L — ABNORMAL HIGH (ref 0–44)
AST: 36 U/L (ref 15–41)
Albumin: 2.4 g/dL — ABNORMAL LOW (ref 3.5–5.0)
Alkaline Phosphatase: 162 U/L — ABNORMAL HIGH (ref 38–126)
Anion gap: 6 (ref 5–15)
BUN: 9 mg/dL (ref 6–20)
CO2: 22 mmol/L (ref 22–32)
Calcium: 9.5 mg/dL (ref 8.9–10.3)
Chloride: 108 mmol/L (ref 98–111)
Creatinine, Ser: 0.89 mg/dL (ref 0.44–1.00)
GFR calc Af Amer: >60 mL/min (ref >60)
GFR calc non Af Amer: >60 mL/min (ref >60)
Glucose, Bld: 96 mg/dL (ref 70–99)
Potassium: 3.7 mmol/L (ref 3.5–5.1)
Sodium: 136 mmol/L (ref 135–145)
Total Bilirubin: 0.4 mg/dL (ref 0.3–1.2)
Total Protein: 6.3 g/dL — ABNORMAL LOW (ref 6.5–8.1)

## 2019-09-17 LAB — TYPE AND SCREEN
ABO/RH(D): O POS
ABO/RH(D): O POS
Antibody Screen: NEGATIVE
Antibody Screen: NEGATIVE

## 2019-09-17 MED ORDER — FAMOTIDINE 20 MG PO TABS
20.0000 mg | ORAL_TABLET | Freq: Every day | ORAL | Status: DC | PRN
  Administered 2019-09-17: 20 mg via ORAL
  Filled 2019-09-17: qty 1

## 2019-09-18 ENCOUNTER — Encounter (HOSPITAL_COMMUNITY): Payer: Self-pay

## 2019-09-18 ENCOUNTER — Inpatient Hospital Stay (HOSPITAL_COMMUNITY): Admission: RE | Admit: 2019-09-18 | Payer: 59 | Source: Home / Self Care | Admitting: Obstetrics and Gynecology

## 2019-09-18 ENCOUNTER — Inpatient Hospital Stay (HOSPITAL_COMMUNITY): Payer: 59

## 2019-09-18 ENCOUNTER — Other Ambulatory Visit: Payer: Self-pay

## 2019-09-18 DIAGNOSIS — O26619 Liver and biliary tract disorders in pregnancy, unspecified trimester: Secondary | ICD-10-CM | POA: Diagnosis present

## 2019-09-18 DIAGNOSIS — K831 Obstruction of bile duct: Secondary | ICD-10-CM | POA: Diagnosis present

## 2019-09-18 DIAGNOSIS — Z3490 Encounter for supervision of normal pregnancy, unspecified, unspecified trimester: Secondary | ICD-10-CM | POA: Diagnosis not present

## 2019-09-18 LAB — RPR: RPR Ser Ql: NONREACTIVE

## 2019-09-18 MED ORDER — TERBUTALINE SULFATE 1 MG/ML IJ SOLN: 0.2500 mg | Freq: Once | INTRAMUSCULAR | Status: DC | PRN

## 2019-09-18 MED ORDER — LIDOCAINE HCL (PF) 1 % IJ SOLN: 30.0000 mL | INTRAMUSCULAR | Status: DC | PRN

## 2019-09-18 MED ORDER — OXYTOCIN BOLUS FROM INFUSION: 500.0000 mL | Freq: Once | INTRAVENOUS | Status: DC

## 2019-09-18 MED ORDER — PENICILLIN G 3 MILLION UNITS IVPB - SIMPLE MED: 3.0000 10*6.[IU] | INTRAVENOUS | Status: DC

## 2019-09-18 MED ORDER — MISOPROSTOL 25 MCG QUARTER TABLET
25.0000 ug | ORAL_TABLET | ORAL | Status: DC | PRN
  Administered 2019-09-18 (×3): 25 ug via VAGINAL
  Filled 2019-09-18 (×5): qty 1

## 2019-09-18 MED ORDER — SOD CITRATE-CITRIC ACID 500-334 MG/5ML PO SOLN: 30.0000 mL | ORAL | Status: DC | PRN

## 2019-09-18 MED ORDER — FENTANYL CITRATE (PF) 100 MCG/2ML IJ SOLN
INTRAMUSCULAR | Status: AC
  Filled 2019-09-18: qty 2

## 2019-09-18 MED ORDER — OXYTOCIN 40 UNITS IN NORMAL SALINE INFUSION - SIMPLE MED: 2.5000 [IU]/h | INTRAVENOUS | Status: DC

## 2019-09-18 MED ORDER — LACTATED RINGERS IV SOLN: 500.0000 mL | INTRAVENOUS | Status: DC | PRN

## 2019-09-18 MED ORDER — ACETAMINOPHEN 325 MG PO TABS: 650.0000 mg | ORAL_TABLET | ORAL | Status: DC | PRN

## 2019-09-18 MED ORDER — SODIUM CHLORIDE 0.9 % IV SOLN: 5.0000 10*6.[IU] | Freq: Once | INTRAVENOUS | Status: DC

## 2019-09-18 MED ORDER — SODIUM CHLORIDE 0.9 % IV SOLN
5.0000 10*6.[IU] | Freq: Once | INTRAVENOUS | Status: AC
  Administered 2019-09-19: 5 10*6.[IU] via INTRAVENOUS
  Filled 2019-09-18: qty 5

## 2019-09-18 MED ORDER — OXYTOCIN 40 UNITS IN NORMAL SALINE INFUSION - SIMPLE MED
1.0000 m[IU]/min | INTRAVENOUS | Status: DC
  Administered 2019-09-19: 2 m[IU]/min via INTRAVENOUS
  Filled 2019-09-18: qty 1000

## 2019-09-18 MED ORDER — FAMOTIDINE 20 MG PO TABS
20.0000 mg | ORAL_TABLET | Freq: Every day | ORAL | Status: DC
  Administered 2019-09-18 – 2019-09-22 (×5): 20 mg via ORAL
  Filled 2019-09-18 (×5): qty 1

## 2019-09-18 MED ORDER — PENICILLIN G 3 MILLION UNITS IVPB - SIMPLE MED
3.0000 10*6.[IU] | INTRAVENOUS | Status: DC
  Administered 2019-09-19 (×4): 3 10*6.[IU] via INTRAVENOUS
  Filled 2019-09-18 (×4): qty 100

## 2019-09-18 MED ORDER — LACTATED RINGERS IV SOLN
INTRAVENOUS | Status: DC
  Administered 2019-09-18 – 2019-09-19 (×5): via INTRAVENOUS

## 2019-09-18 MED ORDER — ONDANSETRON HCL 4 MG/2ML IJ SOLN
4.0000 mg | Freq: Four times a day (QID) | INTRAMUSCULAR | Status: DC | PRN
  Administered 2019-09-18 – 2019-09-19 (×4): 4 mg via INTRAVENOUS
  Filled 2019-09-18 (×6): qty 2

## 2019-09-18 MED ORDER — ADALIMUMAB 40 MG/0.8ML ~~LOC~~ AJKT: 40.0000 mg | AUTO-INJECTOR | SUBCUTANEOUS | Status: DC

## 2019-09-18 MED ORDER — FENTANYL CITRATE (PF) 100 MCG/2ML IJ SOLN: 50.0000 ug | INTRAMUSCULAR | Status: DC | PRN

## 2019-09-18 MED ORDER — FENTANYL CITRATE (PF) 100 MCG/2ML IJ SOLN
100.0000 ug | INTRAMUSCULAR | Status: DC | PRN
  Administered 2019-09-18: 100 ug via INTRAVENOUS
  Administered 2019-09-18: 50 ug via INTRAVENOUS
  Administered 2019-09-18: 100 ug via INTRAVENOUS
  Administered 2019-09-19: 50 ug via INTRAVENOUS
  Filled 2019-09-18 (×3): qty 2

## 2019-09-18 NOTE — Progress Notes (Signed)
Subjective:    Doing well, slight discomfort w/ Cooke balloon, Fentanyl effective for pain management. Personal decision to delay epidural until spont/manual removal of Cooke balloon.   Objective:    VS: BP 122/64   Pulse 69   Temp 98.6 F (37 C) (Oral)   Resp 18   Ht 5' 4"  (1.626 m)   Wt 97.1 kg   LMP 01/02/2019   SpO2 100%   Breastfeeding Unknown   BMI 36.74 kg/m  FHR : baseline 145 / variability moderate / accelerations present / continuous surveillance interrupted, no apparent decelerations Toco: contractions every 4-6 minutes / Membranes: Intact Dilation: Fingertip Effacement (%): Thick Cervical Position: Posterior Station: -3 Presentation: Vertex Exam by:: sowder Rnc  Assessment/Plan:   33 y.o. G1P0 [redacted]w[redacted]d Labor: Cervical balloon remains in place, will removed after 12 hours if not spontaneously expelled.  Fetal Wellbeing:  Category I Pain Control:  IV pain meds and Epidural per pt request I/D:  GBS positiove, begin PCN w/ active labor Anticipated MOD:  NSVD  VRonney Lion MSN 09/18/2019 10:31 PM

## 2019-09-18 NOTE — H&P (Signed)
OB ADMISSION/ HISTORY & PHYSICAL:  Admission Date: 09/13/2019  7:26 PM  Admit Diagnosis: Choloestasis of pregnancy  Transferred from [facility] to Labor and Delivery 09/18/19 @ 0015  Sara Patel is a 33 y.o. female G1P0 @ [redacted]w[redacted]d Pt was admitted for Obs on 09/13/19 for proteinuria versus pre-eclampsia. Protein/creat ratio in office was 0.45 w/ elevated liver enzymes: AST and ALT 53/61 and normotensive BP, +2 non-pitting edema in BLE. Hx significant for: Anemia requiring IV Iron transfusions in 3rd trimester, Crohn's disease w/ permanent colostomy, anemia, and Jehovah's witness. PCR on admission was 0.39, AST/ALT 57/67, normotensive, and negative for neuro S/S. Pt c/o occasional itching of palms and soles, bile acid collected. MFM consult for proteinuria and elevated LFT's. Dr. BGertie Patel IOL @ 338 wksfor symptoms consistent w/ cholestasis. Ms. Sara Patel in-patient until today. Normal range BP, LFT's trending downward, HA x2 relieved w/ Tylenol, pruritis relieved w/ Benadryl, negative for neuro S/S, no change in BLE edema. She has been transferred to L&D for IOL @ 354w0dVertex confirmed w/ bedside U/S.  Prenatal History: G1P0   EDC : 10/09/2019, by LMP and congruent w/ 7w33w2dS [facility]: [facility] Anemia Crohn's disease GBS carrier Vit D Deficiency    Patient Active Problem List   Diagnosis Date Noted  . Proteinuria affecting pregnancy, antepartum, third trimester 09/14/2019  . Preeclampsia w/o severe features 09/14/2019  . Refusal of blood transfusions as patient is Jehovah's Witness 09/14/2019  . Right kidney mass 02/05/2016  . Frequent UTI 01/18/2016  . History of pancreatitis 09/30/2015  . SI (sacroiliac) pain 09/29/2015  . 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 S/P ileostomy (HCC[place]0/17/2016  . Crohn's colitis (HCCEdgewood0/06/2015    Prenatal Labs: ABO, Rh: --/--/O POS (10/27 0526195ntibody: NEG (10/27 0529) Rubella: Immune (10/22  0000)  RPR: Nonreactive (04/01 0000)  HBsAg: Negative (10/22 0000)  HIV: Non-reactive (10/22 0000)  GTT: Normal GBS: Positive/-- (09/02 0000)  GC/CHL: Negative Genetics: Low-risk female  OB History  Gravida Para Term Preterm AB Living  1            SAB TAB Ectopic Multiple Live Births               # Outcome Date GA Lbr Len/2nd Weight Sex Delivery Anes PTL Lv  1 Current             Medical / Surgical History: Past medical history:  Past Medical History:  Diagnosis Date  . Anemia   . Anxiety   . Crohn disease ([id number]   [facility]     Past surgical history:  Past Surgical History:  Procedure Laterality Date  . ABDOMINAL SURGERY     2006, 2011 resections   . ILEOSTOMY  2003  . rectal abscess  2014   . TOTAL COLECTOMY     Family History:  Family History  Problem Relation Age of Onset  . Hypertension Father     Social History:  reports that she has never smoked. She has never used smokeless tobacco. She reports that she does not drink alcohol or use drugs.  Allergies: Iron dextran   Current Medications at time of admission:  Prior to Admission medications   Medication Sig Start Date End Date Taking? Authorizing Provider  acetaminophen (TYLENOL) 325 MG tablet Take 650 mg by mouth every 6 (six) hours as needed (pain).   Yes [provider]  Adalimumab (HUMIRA  PEN) 40 MG/0.8ML PNKT Inject 40 mg into the skin every 7 (seven) days. 08/10/15  Yes [provider]  doxylamine, Sleep, (UNISOM) 25 MG tablet Take 25 mg by mouth at bedtime as needed.   Yes [provider]  omeprazole (PRILOSEC) 20 MG capsule Take 20 mg by mouth daily.   Yes [provider]  ondansetron (ZOFRAN) 4 MG tablet Take 1 tablet (4 mg total) by mouth every 8 (eight) hours as needed for nausea. 03/14/13  Yes Short, Noah Delaine, MD  Prenatal Vit-Fe Fumarate-FA (PRENATAL MULTIVITAMIN) TABS tablet Take 1 tablet by mouth daily at 12 noon.    Yes [provider]  ferrous gluconate (FERGON) 324 MG tablet Take 324 mg by mouth daily. 08/04/15 08/03/16  [provider]    Review of Systems: Constitutional: Negative.   HENT: Negative.   Eyes: Negative.   Respiratory: Negative.   Cardiovascular: Negative.   Gastrointestinal:   Genitourinary: Positive for bloody show negative/positive for LOF   Musculoskeletal: Negative.   Skin: Negative.   Neurological: Negative.   Endo/Heme/Allergies: Negative.   Psychiatric/Behavioral: Negative.    Physical Exam: VS: Blood pressure 114/73, pulse 93, temperature 97.9 F (36.6 C), temperature source Oral, resp. rate 17, height 5' 4"  (1.626 m), weight 97.1 kg, last menstrual period 01/02/2019, SpO2 100 %. Sara Patel x3, no signs of distress Cardiovascular: RRR Respiratory: Lung fields clear with ausculation GU/GI: Abdomen gravid, non-tender, non-distended, active FM, vertex and per Leopold's Extremities: 2+ edema, negative for pain, tenderness, and cords Cervical exam:Dilation: Closed Effacement (%): Thick Station: -4 Exam by:: Sara Patel FHR: baseline rate 140 / variability mod / accelerations present / absent decelerations TOCO: irreg ctx   Prenatal Transfer Tool  Maternal Diabetes: No Genetic Screening: Normal Maternal Ultrasounds/Referrals: Normal Fetal Ultrasounds or other Referrals:  Referred to Materal Fetal Medicine  for hx of Crohn's Maternal Substance Abuse:  No Significant Maternal Medications:  Meds include: Other:  IV Fereheme, cholecalciferol (vitamin D3), Humira(CF) Pen Significant Maternal Lab Results: Group B Strep positive    Assessment: 33 y.o. G1P0 54w0dIOL for Cholestasis of pregnancy     -bile acid 7 FHR category 1 GBS positive     -PCN in active labor BV     -continue Flagyl course Jehovah's witness     -declines blood products, list of acceptable alternatives on chart Crohn's disease w/ permanent colostomy      -stable  Plan:  Transfer to L&D Routine admission orders Bedside U/S     -vertex presentation Cytotec for cervical ripening Epidural PRN  Dr Sara Patel of transfer and induction plan of care  VArrie EasternCNM, MSN 09/18/2019 2:39 AM

## 2019-09-18 NOTE — Progress Notes (Signed)
Transfer pt via WC to Labor and delivery in good condition. Report given to Ola Spurr, rn

## 2019-09-19 ENCOUNTER — Encounter (HOSPITAL_COMMUNITY): Payer: Self-pay

## 2019-09-19 ENCOUNTER — Encounter (HOSPITAL_COMMUNITY)
Admission: AD | Disposition: A | Discharge status: Home / Self Care | Payer: Self-pay | Source: Home / Self Care | Attending: Obstetrics and Gynecology

## 2019-09-19 ENCOUNTER — Inpatient Hospital Stay (HOSPITAL_COMMUNITY): Payer: 59 | Admitting: Anesthesiology

## 2019-09-19 DIAGNOSIS — O99892 Other specified diseases and conditions complicating childbirth: Secondary | ICD-10-CM | POA: Diagnosis not present

## 2019-09-19 DIAGNOSIS — Z3A37 37 weeks gestation of pregnancy: Secondary | ICD-10-CM

## 2019-09-19 DIAGNOSIS — Z9049 Acquired absence of other specified parts of digestive tract: Secondary | ICD-10-CM

## 2019-09-19 SURGERY — Surgical Case
Anesthesia: Epidural

## 2019-09-19 MED ORDER — MEPERIDINE HCL 25 MG/ML IJ SOLN
6.2500 mg | INTRAMUSCULAR | Status: DC | PRN
  Administered 2019-09-19: 6.25 mg via INTRAVENOUS

## 2019-09-19 MED ORDER — LIDOCAINE HCL (PF) 1 % IJ SOLN
INTRAMUSCULAR | Status: DC | PRN
  Administered 2019-09-19 (×2): 6 mL via EPIDURAL

## 2019-09-19 MED ORDER — LIDOCAINE-EPINEPHRINE (PF) 2 %-1:200000 IJ SOLN
INTRAMUSCULAR | Status: AC
  Filled 2019-09-19: qty 10

## 2019-09-19 MED ORDER — OXYCODONE HCL 5 MG/5ML PO SOLN: 5.0000 mg | Freq: Once | ORAL | Status: DC | PRN

## 2019-09-19 MED ORDER — MORPHINE SULFATE (PF) 0.5 MG/ML IJ SOLN
INTRAMUSCULAR | Status: DC | PRN
  Administered 2019-09-19: 3 mg via EPIDURAL

## 2019-09-19 MED ORDER — ONDANSETRON HCL 4 MG/2ML IJ SOLN
4.0000 mg | Freq: Once | INTRAMUSCULAR | Status: AC
  Administered 2019-09-19: 4 mg via INTRAVENOUS

## 2019-09-19 MED ORDER — NALOXONE HCL 4 MG/10ML IJ SOLN
1.0000 ug/kg/h | INTRAVENOUS | Status: DC | PRN
  Filled 2019-09-19: qty 5

## 2019-09-19 MED ORDER — FENTANYL-BUPIVACAINE-NACL 0.5-0.125-0.9 MG/250ML-% EP SOLN
12.0000 mL/h | EPIDURAL | Status: DC | PRN
  Filled 2019-09-19: qty 250

## 2019-09-19 MED ORDER — CEFAZOLIN SODIUM-DEXTROSE 2-3 GM-%(50ML) IV SOLR
INTRAVENOUS | Status: DC | PRN
  Administered 2019-09-19: 2 g via INTRAVENOUS

## 2019-09-19 MED ORDER — DIPHENHYDRAMINE HCL 50 MG/ML IJ SOLN: 12.5000 mg | INTRAMUSCULAR | Status: DC | PRN

## 2019-09-19 MED ORDER — BUPIVACAINE HCL 0.25 % IJ SOLN
INTRAMUSCULAR | Status: DC | PRN
  Administered 2019-09-19: 20 mL

## 2019-09-19 MED ORDER — KETOROLAC TROMETHAMINE 30 MG/ML IJ SOLN: 30.0000 mg | Freq: Four times a day (QID) | INTRAMUSCULAR | Status: AC | PRN

## 2019-09-19 MED ORDER — ONDANSETRON HCL 4 MG/2ML IJ SOLN: 4.0000 mg | Freq: Three times a day (TID) | INTRAMUSCULAR | Status: DC | PRN

## 2019-09-19 MED ORDER — NALBUPHINE HCL 10 MG/ML IJ SOLN
5.0000 mg | INTRAMUSCULAR | Status: DC | PRN
  Filled 2019-09-19: qty 0.5

## 2019-09-19 MED ORDER — SODIUM CHLORIDE (PF) 0.9 % IJ SOLN
INTRAMUSCULAR | Status: DC | PRN
  Administered 2019-09-19: 12 mL/h via EPIDURAL

## 2019-09-19 MED ORDER — METHYLERGONOVINE MALEATE 0.2 MG/ML IJ SOLN
INTRAMUSCULAR | Status: DC | PRN
  Administered 2019-09-19: 0.2 mg via INTRAMUSCULAR

## 2019-09-19 MED ORDER — PHENYLEPHRINE 40 MCG/ML (10ML) SYRINGE FOR IV PUSH (FOR BLOOD PRESSURE SUPPORT)
80.0000 ug | PREFILLED_SYRINGE | INTRAVENOUS | Status: DC | PRN
  Filled 2019-09-19: qty 10

## 2019-09-19 MED ORDER — SODIUM CHLORIDE 0.9 % IV SOLN
INTRAVENOUS | Status: DC | PRN
  Administered 2019-09-19: 21:00:00 via INTRAVENOUS

## 2019-09-19 MED ORDER — HYDROMORPHONE HCL 1 MG/ML IJ SOLN: 0.2500 mg | INTRAMUSCULAR | Status: DC | PRN

## 2019-09-19 MED ORDER — ONDANSETRON HCL 4 MG/2ML IJ SOLN: 4.0000 mg | Freq: Once | INTRAMUSCULAR | Status: DC | PRN

## 2019-09-19 MED ORDER — PHENYLEPHRINE 40 MCG/ML (10ML) SYRINGE FOR IV PUSH (FOR BLOOD PRESSURE SUPPORT): 80.0000 ug | PREFILLED_SYRINGE | INTRAVENOUS | Status: DC | PRN

## 2019-09-19 MED ORDER — LACTATED RINGERS IV SOLN
INTRAVENOUS | Status: DC | PRN
  Administered 2019-09-19 (×2): via INTRAVENOUS

## 2019-09-19 MED ORDER — NALBUPHINE HCL 10 MG/ML IJ SOLN
5.0000 mg | Freq: Once | INTRAMUSCULAR | Status: DC | PRN
  Filled 2019-09-19: qty 0.5

## 2019-09-19 MED ORDER — OXYCODONE HCL 5 MG PO TABS: 5.0000 mg | ORAL_TABLET | Freq: Once | ORAL | Status: DC | PRN

## 2019-09-19 MED ORDER — KETOROLAC TROMETHAMINE 30 MG/ML IJ SOLN: 30.0000 mg | Freq: Once | INTRAMUSCULAR | Status: DC

## 2019-09-19 MED ORDER — ACETAMINOPHEN 500 MG PO TABS: 1000.0000 mg | ORAL_TABLET | Freq: Four times a day (QID) | ORAL | Status: DC

## 2019-09-19 MED ORDER — ONDANSETRON HCL 4 MG/2ML IJ SOLN
INTRAMUSCULAR | Status: DC | PRN
  Administered 2019-09-19: 4 mg via INTRAVENOUS

## 2019-09-19 MED ORDER — BUPIVACAINE HCL (PF) 0.25 % IJ SOLN
INTRAMUSCULAR | Status: AC
  Filled 2019-09-19: qty 20

## 2019-09-19 MED ORDER — FENTANYL CITRATE (PF) 100 MCG/2ML IJ SOLN: 50.0000 ug | INTRAMUSCULAR | Status: DC | PRN

## 2019-09-19 MED ORDER — NALOXONE HCL 0.4 MG/ML IJ SOLN: 0.4000 mg | INTRAMUSCULAR | Status: DC | PRN

## 2019-09-19 MED ORDER — SODIUM CHLORIDE 0.9% FLUSH: 3.0000 mL | INTRAVENOUS | Status: DC | PRN

## 2019-09-19 MED ORDER — SCOPOLAMINE 1 MG/3DAYS TD PT72
MEDICATED_PATCH | TRANSDERMAL | Status: DC | PRN
  Administered 2019-09-19: 1 via TRANSDERMAL

## 2019-09-19 MED ORDER — DIPHENHYDRAMINE HCL 50 MG/ML IJ SOLN
INTRAMUSCULAR | Status: DC | PRN
  Administered 2019-09-19: 25 mg via INTRAVENOUS

## 2019-09-19 MED ORDER — MEPERIDINE HCL 25 MG/ML IJ SOLN: 6.2500 mg | INTRAMUSCULAR | Status: DC | PRN

## 2019-09-19 MED ORDER — LIDOCAINE-EPINEPHRINE (PF) 2 %-1:200000 IJ SOLN
INTRAMUSCULAR | Status: DC | PRN
  Administered 2019-09-19: 3 mL via EPIDURAL
  Administered 2019-09-19: 10 mL via EPIDURAL

## 2019-09-19 MED ORDER — SODIUM CHLORIDE 0.9 % IR SOLN
Status: DC | PRN
  Administered 2019-09-19: 1

## 2019-09-19 MED ORDER — CEFAZOLIN SODIUM-DEXTROSE 2-4 GM/100ML-% IV SOLN
INTRAVENOUS | Status: AC
  Filled 2019-09-19: qty 100

## 2019-09-19 MED ORDER — MEPERIDINE HCL 25 MG/ML IJ SOLN
INTRAMUSCULAR | Status: AC
  Filled 2019-09-19: qty 1

## 2019-09-19 MED ORDER — DEXAMETHASONE SODIUM PHOSPHATE 4 MG/ML IJ SOLN
INTRAMUSCULAR | Status: DC | PRN
  Administered 2019-09-19: 4 mg via INTRAVENOUS

## 2019-09-19 MED ORDER — OXYTOCIN 40 UNITS IN NORMAL SALINE INFUSION - SIMPLE MED
INTRAVENOUS | Status: AC
  Filled 2019-09-19: qty 2000

## 2019-09-19 MED ORDER — EPHEDRINE 5 MG/ML INJ: 10.0000 mg | INTRAVENOUS | Status: DC | PRN

## 2019-09-19 MED ORDER — MISOPROSTOL 50MCG HALF TABLET
50.0000 ug | ORAL_TABLET | ORAL | Status: DC
  Administered 2019-09-19: 50 ug via ORAL
  Filled 2019-09-19 (×2): qty 1

## 2019-09-19 MED ORDER — KETOROLAC TROMETHAMINE 30 MG/ML IJ SOLN
30.0000 mg | Freq: Once | INTRAMUSCULAR | Status: AC | PRN
  Administered 2019-09-19: 30 mg via INTRAVENOUS

## 2019-09-19 MED ORDER — HYDROXYZINE HCL 25 MG PO TABS
50.0000 mg | ORAL_TABLET | Freq: Three times a day (TID) | ORAL | Status: DC | PRN
  Administered 2019-09-19: 50 mg via ORAL
  Filled 2019-09-19: qty 1

## 2019-09-19 MED ORDER — MORPHINE SULFATE (PF) 0.5 MG/ML IJ SOLN
INTRAMUSCULAR | Status: AC
  Filled 2019-09-19: qty 10

## 2019-09-19 MED ORDER — PHENYLEPHRINE HCL (PRESSORS) 10 MG/ML IV SOLN
INTRAVENOUS | Status: DC | PRN
  Administered 2019-09-19 (×2): 80 ug via INTRAVENOUS
  Administered 2019-09-19: 40 ug via INTRAVENOUS
  Administered 2019-09-19 (×2): 80 ug via INTRAVENOUS
  Administered 2019-09-19: 40 ug via INTRAVENOUS
  Administered 2019-09-19 (×2): 80 ug via INTRAVENOUS

## 2019-09-19 MED ORDER — KETOROLAC TROMETHAMINE 30 MG/ML IJ SOLN
INTRAMUSCULAR | Status: AC
  Filled 2019-09-19: qty 1

## 2019-09-19 MED ORDER — LACTATED RINGERS IV SOLN: 500.0000 mL | Freq: Once | INTRAVENOUS | Status: DC

## 2019-09-19 MED ORDER — DIPHENHYDRAMINE HCL 25 MG PO CAPS: 25.0000 mg | ORAL_CAPSULE | ORAL | Status: DC | PRN

## 2019-09-19 MED ORDER — FENTANYL CITRATE (PF) 100 MCG/2ML IJ SOLN
INTRAMUSCULAR | Status: DC | PRN
  Administered 2019-09-19: 50 ug via INTRAVENOUS

## 2019-09-19 MED ORDER — SCOPOLAMINE 1 MG/3DAYS TD PT72: 1.0000 | MEDICATED_PATCH | Freq: Once | TRANSDERMAL | Status: DC

## 2019-09-19 MED ORDER — FENTANYL CITRATE (PF) 100 MCG/2ML IJ SOLN
INTRAMUSCULAR | Status: AC
  Filled 2019-09-19: qty 2

## 2019-09-19 MED ORDER — OXYTOCIN 40 UNITS IN NORMAL SALINE INFUSION - SIMPLE MED
INTRAVENOUS | Status: DC | PRN
  Administered 2019-09-19: 40 [IU] via INTRAVENOUS

## 2019-09-19 SURGICAL SUPPLY — 44 items
ADH SKN CLS APL DERMABOND .7 (GAUZE/BANDAGES/DRESSINGS) ×1
APL SKNCLS STERI-STRIP NONHPOA (GAUZE/BANDAGES/DRESSINGS) ×1
BENZOIN TINCTURE PRP APPL 2/3 (GAUZE/BANDAGES/DRESSINGS) ×2 IMPLANT
CHLORAPREP W/TINT 26ML (MISCELLANEOUS) ×3 IMPLANT
CLAMP CORD UMBIL (MISCELLANEOUS) IMPLANT
CLOSURE STERI STRIP 1/2 X4 (GAUZE/BANDAGES/DRESSINGS) ×2 IMPLANT
CLOTH BEACON ORANGE TIMEOUT ST (SAFETY) ×3 IMPLANT
DERMABOND ADVANCED (GAUZE/BANDAGES/DRESSINGS) ×2
DERMABOND ADVANCED .7 DNX12 (GAUZE/BANDAGES/DRESSINGS) IMPLANT
DRSG OPSITE POSTOP 4X10 (GAUZE/BANDAGES/DRESSINGS) ×3 IMPLANT
ELECT REM PT RETURN 9FT ADLT (ELECTROSURGICAL) ×3
ELECTRODE REM PT RTRN 9FT ADLT (ELECTROSURGICAL) ×1 IMPLANT
EXTRACTOR VACUUM M CUP 4 TUBE (SUCTIONS) IMPLANT
EXTRACTOR VACUUM M CUP 4' TUBE (SUCTIONS)
GAUZE SPONGE 4X4 12PLY STRL LF (GAUZE/BANDAGES/DRESSINGS) ×4 IMPLANT
GLOVE BIOGEL PI IND STRL 7.0 (GLOVE) ×3 IMPLANT
GLOVE BIOGEL PI INDICATOR 7.0 (GLOVE) ×10
GLOVE ECLIPSE 7.0 STRL STRAW (GLOVE) ×3 IMPLANT
GOWN STRL REUS W/TWL LRG LVL3 (GOWN DISPOSABLE) ×6 IMPLANT
KIT ABG SYR 3ML LUER SLIP (SYRINGE) IMPLANT
NDL HYPO 25X5/8 SAFETYGLIDE (NEEDLE) ×1 IMPLANT
NEEDLE HYPO 22GX1.5 SAFETY (NEEDLE) ×5 IMPLANT
NEEDLE HYPO 25X5/8 SAFETYGLIDE (NEEDLE) ×3 IMPLANT
NS IRRIG 1000ML POUR BTL (IV SOLUTION) ×3 IMPLANT
PACK C SECTION WH (CUSTOM PROCEDURE TRAY) ×3 IMPLANT
PAD ABD 7.5X8 STRL (GAUZE/BANDAGES/DRESSINGS) ×5 IMPLANT
PAD OB MATERNITY 4.3X12.25 (PERSONAL CARE ITEMS) ×3 IMPLANT
PENCIL SMOKE EVAC W/HOLSTER (ELECTROSURGICAL) ×3 IMPLANT
RTRCTR C-SECT PINK 25CM LRG (MISCELLANEOUS) IMPLANT
SPONGE LAP 18X18 RF (DISPOSABLE) ×2 IMPLANT
SUT CHROMIC 2 0 CT 1 (SUTURE) ×6 IMPLANT
SUT MNCRL 0 VIOLET CTX 36 (SUTURE) IMPLANT
SUT MONOCRYL 0 CTX 36 (SUTURE) ×4
SUT PDS AB 0 CTX 36 PDP370T (SUTURE) ×2 IMPLANT
SUT PLAIN 2 0 (SUTURE) ×3
SUT PLAIN 2 0 XLH (SUTURE) IMPLANT
SUT PLAIN ABS 2-0 CT1 27XMFL (SUTURE) IMPLANT
SUT VIC AB 0 CTX 36 (SUTURE) ×6
SUT VIC AB 0 CTX36XBRD ANBCTRL (SUTURE) ×2 IMPLANT
SUT VIC AB 4-0 KS 27 (SUTURE) ×3 IMPLANT
SYR CONTROL 10ML LL (SYRINGE) ×5 IMPLANT
TOWEL OR 17X24 6PK STRL BLUE (TOWEL DISPOSABLE) ×3 IMPLANT
TRAY FOLEY W/BAG SLVR 14FR LF (SET/KITS/TRAYS/PACK) ×3 IMPLANT
WATER STERILE IRR 1000ML POUR (IV SOLUTION) ×3 IMPLANT

## 2019-09-19 NOTE — Transfer of Care (Signed)
Immediate Anesthesia Transfer of Care Note  Patient: Sara Patel  Procedure(s) Performed: CESAREAN SECTION (N/A )  Patient Location: PACU  Anesthesia Type:Epidural  Level of Consciousness: awake, alert  and patient cooperative  Airway & Oxygen Therapy: Patient Spontanous Breathing  Post-op Assessment: Report given to RN and Post -op Vital signs reviewed and stable  Post vital signs: Reviewed and stable  Last Vitals:  Vitals Value Taken Time  BP 162/128 09/19/19 2239  Temp    Pulse 107 09/19/19 2242  Resp 20 09/19/19 2242  SpO2 98 % 09/19/19 2242  Vitals shown include unvalidated device data.  Last Pain:  Vitals:   09/19/19 1732  TempSrc: Oral  PainSc: 0-No pain      Patients Stated Pain Goal: 1 (50/53/97 6734)  Complications: No apparent anesthesia complications

## 2019-09-19 NOTE — Progress Notes (Signed)
Patient ID: Sara Patel, female   DOB: 1986-03-30, 33 y.o.   MRN: [id number] Keashia Haskins is a 33 y.o. G1P0 at 10w1dby ultrasound admitted for IOL at 37 wks for ICP   IOL started 09/18/2019 - early am with vaginal cytotec 25 mcg Cervical balloon (Cook) x 12 hrs, removed 10/29/202 at 0300 Pitocin IV titration x 12 hrs, DCed at 1600 today AROM clear AF 09/19/2019 10 am Resumed oral cytotec 50 mcg 1700 x 3 doses  Subjective:  Patient comfortable with epidural, in bed, changing colostomy bag with partner's assist.   Objective: Vitals:   09/19/19 1832 09/19/19 1842 09/19/19 1900 09/19/19 1930  BP: (!) 134/99 120/60 (!) 112/56 130/68  Pulse: 76 72 78 74  Resp:  16 16 18   Temp:      TempSrc:      SpO2:      Weight:      Height:        I/O last 3 completed shifts: In: 2964.9 [I.V.:2751.6; IV Piggyback:213.3] Out: 2375 [Urine:2150; Stool:225] No intake/output data recorded.   FHT:  FHR: 140 bpm, variability: moderate,  accelerations:  Present,  decelerations:  Absent UC:   irregular, every 3-5 minutes, minimal amplitude on IUPC SVE:   deferred  Labs:   Recent Labs    09/17/19 0529  WBC 9.5  HGB 10.6*  HCT 32.2*  PLT 183    Assessment / Plan: Induction of labor due to cholestasis,  Cervical ripening / prolonged AROM IUPC in place S/P vaginal cytotec, Cook cervical balloon x 12 hrs and Pitocin x 12 hrs   Labor: Now resumed Cytotec ripening 50 mcg buccal x 3 doses as needed, 1st dose at 1700 Will restart Pitocin after 3rd dose Preeclampsia:  no signs or symptoms of toxicity, labs stable and mild elevation in LFT's and PCR ratio Fetal Wellbeing:  Category I Pain Control:  Epidural - discussed with patient need to eat for effective uterine strength activity, recommend DC basal epidural infusion, maintain patent catheter, can resume epidural infusion as needed with active labor. Patient agrees.   I/D:  GBS positive on PCN protocol Anticipated MOD:  working towards  vaginal birth   POC in consult with Dr. RCletis Mediaand Dr. PSelinda Orion CNM, MSN 09/19/2019, 8:05 PM

## 2019-09-19 NOTE — Progress Notes (Addendum)
Sara Patel is a 33 y.o. G1P0 at 41w1ddmitted for induction of labor due to cholestasis of pregnancy.  Subjective:  Comfortable with  Epidural Contractions every 2-5 minutes, lasting 40 seconds. Palpating moderate with Pitocin at 16 mU/min    Objective: BP 119/72   Pulse 72   Temp 98.1 F (36.7 C) (Oral)   Resp 16   Ht 5' 4"  (1.626 m)   Wt 97.1 kg   LMP 01/02/2019   SpO2 100%   Breastfeeding Unknown   BMI 36.74 kg/m  I/O last 3 completed shifts: In: -  Out: 250 [Urine:250] No intake/output data recorded.  FHT:  Category 1 with good variability and accelerations SVE:   Dilation: 3 Effacement (%): 50 Station: -2 AROM with abundant clear amniotic fluid. IUPC easily inserted  Labs: Lab Results  Component Value Date   WBC 9.5 09/17/2019   HGB 10.6 (L) 09/17/2019   HCT 32.2 (L) 09/17/2019   MCV 88.5 09/17/2019   PLT 183 09/17/2019    Assessment / Plan: Induction of labor due to cholestasis,  progressing well on pitocin Fetal Wellbeing: reassuring Findings and plan of care reviewed with patient and FOB Anticipated MOD:  NSVD  SKatharine LookA Winson Eichorn 09/19/2019, 10:37 AM

## 2019-09-19 NOTE — Progress Notes (Signed)
Subjective:    Doing well, Fentanyl effective for cramping, but causes nausea per pt. Nausea well-managed w/ Zofran.   Objective:    VS: BP 121/74   Pulse 81   Temp 98.1 F (36.7 C) (Oral)   Resp 18   Ht 5' 4"  (1.626 m)   Wt 97.1 kg   LMP 01/02/2019   SpO2 100%   Breastfeeding Unknown   BMI 36.74 kg/m  FHR : baseline 135 / variability moderate / accelerations present / absent decelerations Toco: irreg contractions  Membranes: intact Dilation: 2 Effacement (%): 50 Cervical Position: Posterior Station: -3 Presentation: Vertex Exam by:: Clois Dupes, CNM    Cooke balloon deflated (89m from inner and 480mfrom outer) and removed w/o difficulty. Bloody show noted w. SVE  Assessment/Plan:   [age].o. G1P0 3740w1dabor: CooMarsa Arislloon deflated and removed, Pit started Fetal Wellbeing:  Category I Pain Control:  IV pain meds and Epidural upon request I/D:  GBS positive, PCN started Anticipated MOD:  NSVD  VivArrie EasternM, MSN 09/19/2019 3:17 AM

## 2019-09-19 NOTE — Progress Notes (Signed)
Subjective:    Comfortable w/ epidural, spouse is supportive @ bedside  Objective:    VS: BP (!) 110/51   Pulse 74   Temp 98.7 F (37.1 C) (Oral)   Resp 20   Ht 5' 4"  (1.626 m)   Wt 97.1 kg   LMP 01/02/2019   SpO2 100%   Breastfeeding Unknown   BMI 36.74 kg/m    FHR : baseline 140 / variability moderate / accelerations present / absent decelerations Toco: contractions every 3-5 minutes  Membranes: Intact Dilation: 2 Effacement (%): 50 Cervical Position: Posterior Station: -3 Presentation: Vertex Exam by:: B McClam, RN  Pitocin 79m/min   Assessment/Plan:   33y.o. G1P0 314w1dLabor: Pitocin infusing, will continue to increase and possibly AROM Fetal Wellbeing:  Category I Pain Control:  Epidural I/D:  GBS positive, PCN started Anticipated MOD:  NSVD  Plan reviewed w/ Dr. RiBurna CashNM, MSN 09/19/2019 7:29 AM

## 2019-09-19 NOTE — Progress Notes (Signed)
   09/19/19 1635  Fetal Heart Rate A  Mode External  Baseline Rate (A) 135 bpm  Variability 6-25 BPM  Accelerations 15 x 15  Decelerations None  Uterine Activity  Mode Intrauterine pressure catheter  Contraction Frequency (min) 4-6  Contraction Duration (sec) 60  Contraction Quality Mild  Resting Tone Palpated Relaxed  Resting Time Adequate  Above assessment called to Dr Cletis Media as requested. See orders.

## 2019-09-19 NOTE — Progress Notes (Signed)
Patient ID: Sara Patel, female   DOB: 10-Jun-1986, 33 y.o.   MRN: [id number]   At patient bedside to assess cervical change prior to next oral Cytotec due at 2100.  Patient comfortable, epidural has been turned off and patient had eaten light meal in anticipation of continued cervical ripening.   FHT category 1 with baseline 140, moderate variability, interrupted tracing d/t patient positioning and body habitus.  Ctx q 2-4 min and mild  2110 - SVE 3/50/-3 Small loop of umbilical cord palpable protruding at cervical os.  Head displaced off cervix and patient placed in trendelenburg, STAT OB emergency status called and patient wheeled to OR for emergent cesarean section, patient advised of need for expedited delivery due to obstetric emergency and verbally consents, CNM traded off with RN for fetal head displacement. Hospitalist OB resumed care of patient in OR for surgery. Dr. Alwyn Pea and Dr. Mancel Bale notified and in route. General surgeon paged to standby (colectomy complications PRN).  2113 - birth of female newborn, care resumed by NICU team for resuscitation.   Juliene Pina, MSN, CNM 09/19/2019, 9:54 PM

## 2019-09-19 NOTE — Op Note (Signed)
Arvilla Molinaro PROCEDURE DATE: 09/19/2019  PREOPERATIVE DIAGNOSES: Intrauterine pregnancy at 46w1dweeks gestation; cord prolapse  POSTOPERATIVE DIAGNOSES: The same  PROCEDURE: STAT Primary Low Transverse Cesarean Section  SURGEON:  Dr. UVerita Schneidersand Dr. WSanjuana Kava ASSISTANT:  Dr. CBarrington Ellison ANESTHESIOLOGY TEAM: Anesthesiologist: CMontez Hageman MD; FPervis Hocking DO; HLyn Hollingshead MD CRNA: BSandrea Matte CRNA; GRiki Sheer CRNA  INDICATIONS: CLindell Tusseyis a 33y.o. G1P0 at 395w1dere for cesarean section secondary to the indications listed under preoperative diagnoses; please see preoperative note for further details.  Code Cesarean was activated.     FINDINGS:  Viable female infant in cephalic presentation.  Apgars pending at the time of this note. Scant clear amniotic fluid. Intact placenta, three vessel cord.  Normal uterus, fallopian tubes and ovaries bilaterally.  ANESTHESIA: Epidural  SPECIMENS: Placenta sent to pathology COMPLICATIONS: None immediate  PROCEDURE IN DETAIL:  The patient was urgently taken to the the operating room where her epidural anesthesia was dosed up to surgical level and was found to be adequate. She was then placed in a dorsal supine position with a leftward tilt, prepped quickly with betadine and draped in a sterile manner.  She already had a foley catheter in her bladder from L&D.  After a timeout was performed, a Pfannenstiel skin incision was made with scalpel and carried through to the underlying layer of fascia. The fascial incision was extended bilaterally in a blunt fashion.  The fascia was separated from underlying rectus muscles bluntly.  The rectus muscles were separated in the midline bluntly and the peritoneum was entered bluntly. Attention was turned to the lower uterine segment where a low transverse hysterotomy was made with a scalpel and extended bilaterally bluntly.  The infant was successfully delivered,  the cord was clamped and cut and the infant was handed over to awaiting neonatology team. Incision to delivery time was less than two minutes.  The placenta was delivered intact and had a three-vessel cord. Arterial cord gas sample was obtained, The uterus was then cleared of clot and debris.  At this point, Dr. PiAlwyn Pearrived and took over the procedure and started the hysterotomy repair. Please refer to her operative note for further details.    UGVerita SchneidersMD, FADodd Cityor WoDean Foods CompanyCoMontrose

## 2019-09-19 NOTE — Anesthesia Preprocedure Evaluation (Signed)
Anesthesia Evaluation  Patient identified by MRN, date of birth, ID band Patient awake    Reviewed: Allergy & Precautions, H&P , NPO status , Patient's Chart, lab work & pertinent test results  Airway Mallampati: II  TM Distance: >3 FB Neck ROM: full    Dental no notable dental hx. (+) Teeth Intact   Pulmonary neg pulmonary ROS,    Pulmonary exam normal breath sounds clear to auscultation       Cardiovascular negative cardio ROS Normal cardiovascular exam Rhythm:regular Rate:Normal     Neuro/Psych PSYCHIATRIC DISORDERS Anxiety negative neurological ROS     GI/Hepatic negative GI ROS, Neg liver ROS,   Endo/Other  negative endocrine ROS  Renal/GU negative Renal ROS  negative genitourinary   Musculoskeletal negative musculoskeletal ROS (+)   Abdominal (+) + obese,   Peds  Hematology  (+) Blood dyscrasia, anemia ,   Anesthesia Other Findings   Reproductive/Obstetrics (+) Pregnancy                             Anesthesia Physical Anesthesia Plan  ASA: II  Anesthesia Plan: Epidural   Post-op Pain Management:    Induction:   PONV Risk Score and Plan:   Airway Management Planned:   Additional Equipment:   Intra-op Plan:   Post-operative Plan:   Informed Consent: I have reviewed the patients History and Physical, chart, labs and discussed the procedure including the risks, benefits and alternatives for the proposed anesthesia with the patient or authorized representative who has indicated his/her understanding and acceptance.       Plan Discussed with:   Anesthesia Plan Comments:         Anesthesia Quick Evaluation

## 2019-09-19 NOTE — Progress Notes (Signed)
Faculty Practice OB/GYN Attending Note  I was called and informed of patient's fetal cord prolapse. Code Cesarean was activated, please see operative note for more details.   Sara Schneiders, MD, [place] for [employer], [place]

## 2019-09-19 NOTE — Anesthesia Procedure Notes (Addendum)
Epidural Patient location during procedure: OB Start time: 09/19/2019 4:37 AM End time: 09/19/2019 4:40 AM  Staffing Anesthesiologist: Lyn Hollingshead, MD Performed: anesthesiologist   Preanesthetic Checklist Completed: patient identified, site marked, surgical consent, pre-op evaluation, timeout performed, IV checked, risks and benefits discussed and monitors and equipment checked  Epidural Patient position: sitting Prep: site prepped and draped and DuraPrep Patient monitoring: continuous pulse ox and blood pressure Approach: midline Location: L3-L4 Injection technique: LOR air  Needle:  Needle type: Tuohy  Needle gauge: 17 G Needle length: 9 cm and 9 Needle insertion depth: 6 cm Catheter type: closed end flexible Catheter size: 19 Gauge Catheter at skin depth: 11 cm Test dose: negative and Other  Assessment Events: blood not aspirated, injection not painful, no injection resistance, negative IV test and no paresthesia  Additional Notes Reason for block:procedure for pain

## 2019-09-19 NOTE — Progress Notes (Signed)
Sara Patel is a 33 y.o. G1P0 at 56w1ddmitted for induction of labor due to cholestasis of pregnancy. Has received Cytotec per vagina x3 followed by cervical balloon for 12 hours followed by Pitocin for 12 hours and AROM since 10:00 am  Subjective:  Comfortable with  Epidural. Feeling frustrated    Objective: BP (!) 134/99   Pulse 76   Temp 98.2 F (36.8 C) (Oral)   Resp 18   Ht 5' 4"  (1.626 m)   Wt 97.1 kg   LMP 01/02/2019   SpO2 100%   Breastfeeding Unknown   BMI 36.74 kg/m  I/O last 3 completed shifts: In: -  Out: 250 [Urine:250] Total I/O In: 2964.9 [I.V.:2751.6; IV Piggyback:213.3] Out: 2125 [Urine:1900; Stool:225]  FHT:  Category 1 with good variability and accelerations SVE:   Dilation: 3 Effacement (%): 50 Station: -2   Labs: Lab Results  Component Value Date   WBC 9.5 09/17/2019   HGB 10.6 (L) 09/17/2019   HCT 32.2 (L) 09/17/2019   MCV 88.5 09/17/2019   PLT 183 09/17/2019    Assessment / Plan: Induction of labor due to cholestasis with slow progress Fetal Wellbeing: reassuring Findings and plan of care reviewed with patient and FOB: continue with PO Cytotec x3 then restart Pitocin Anticipated MOD:  NSVD  SKatharine LookA Sweetie Giebler 09/19/2019, 6:41 PM

## 2019-09-19 NOTE — Progress Notes (Signed)
Pt care assumed by this RN at this time.

## 2019-09-19 NOTE — Anesthesia Postprocedure Evaluation (Signed)
Anesthesia Post Note  Patient: Sara Patel  Procedure(s) Performed: CESAREAN SECTION (N/A )     Patient location during evaluation: PACU Anesthesia Type: Epidural Level of consciousness: awake and alert Pain management: pain level controlled Vital Signs Assessment: post-procedure vital signs reviewed and stable Respiratory status: spontaneous breathing, nonlabored ventilation and respiratory function stable Cardiovascular status: blood pressure returned to baseline and stable Postop Assessment: no apparent nausea or vomiting, patient able to bend at knees, epidural receding, no backache and no headache Anesthetic complications: no    Last Vitals:  Vitals:   09/19/19 2240 09/19/19 2300  BP: (!) 162/102 134/66  Pulse: (!) 101 86  Resp: 16 19  Temp: 37.2 C   SpO2:  96%    Last Pain:  Vitals:   09/19/19 1732  TempSrc: Oral  PainSc: 0-No pain   Pain Goal: Patients Stated Pain Goal: 1 (09/17/19 0758)  LLE Motor Response: Purposeful movement (09/19/19 2300) LLE Sensation: Tingling (09/19/19 2300) RLE Motor Response: Purposeful movement (09/19/19 2300) RLE Sensation: Tingling (09/19/19 2300)     Epidural/Spinal Function Cutaneous sensation: Tingles (09/19/19 2300), Patient able to flex knees: Yes (09/19/19 2300), Patient able to lift hips off bed: No (09/19/19 2300), Back pain beyond tenderness at insertion site: No (09/19/19 2300), Progressively worsening motor and/or sensory loss: No (09/19/19 2300), Bowel and/or bladder incontinence post epidural: No (09/19/19 2300)  Pervis Hocking

## 2019-09-20 ENCOUNTER — Encounter (HOSPITAL_COMMUNITY): Payer: Self-pay

## 2019-09-20 LAB — CBC
HCT: 30.7 % — ABNORMAL LOW (ref 36.0–46.0)
Hemoglobin: 10 g/dL — ABNORMAL LOW (ref 12.0–15.0)
MCH: 29.1 pg (ref 26.0–34.0)
MCHC: 32.6 g/dL (ref 30.0–36.0)
MCV: 89.2 fL (ref 80.0–100.0)
Platelets: 177 10*3/uL (ref 150–400)
RBC: 3.44 MIL/uL — ABNORMAL LOW (ref 3.87–5.11)
RDW: 16.3 % — ABNORMAL HIGH (ref 11.5–15.5)
WBC: 17.4 10*3/uL — ABNORMAL HIGH (ref 4.0–10.5)
nRBC: 0 % (ref 0.0–0.2)

## 2019-09-20 MED ORDER — IBUPROFEN 800 MG PO TABS
800.0000 mg | ORAL_TABLET | Freq: Four times a day (QID) | ORAL | Status: DC
  Administered 2019-09-20 – 2019-09-21 (×4): 800 mg via ORAL
  Filled 2019-09-20 (×5): qty 1

## 2019-09-20 MED ORDER — ZOLPIDEM TARTRATE 5 MG PO TABS: 5.0000 mg | ORAL_TABLET | Freq: Every evening | ORAL | Status: DC | PRN

## 2019-09-20 MED ORDER — DIBUCAINE (PERIANAL) 1 % EX OINT: 1.0000 "application " | TOPICAL_OINTMENT | CUTANEOUS | Status: DC | PRN

## 2019-09-20 MED ORDER — SIMETHICONE 80 MG PO CHEW
80.0000 mg | CHEWABLE_TABLET | Freq: Three times a day (TID) | ORAL | Status: DC
  Administered 2019-09-20 – 2019-09-22 (×6): 80 mg via ORAL
  Filled 2019-09-20 (×6): qty 1

## 2019-09-20 MED ORDER — SIMETHICONE 80 MG PO CHEW: 80.0000 mg | CHEWABLE_TABLET | ORAL | Status: DC | PRN

## 2019-09-20 MED ORDER — OXYCODONE HCL 5 MG PO TABS
5.0000 mg | ORAL_TABLET | ORAL | Status: DC | PRN
  Administered 2019-09-21: 5 mg via ORAL
  Administered 2019-09-21: 10 mg via ORAL
  Administered 2019-09-21: 5 mg via ORAL
  Administered 2019-09-21 – 2019-09-22 (×4): 10 mg via ORAL
  Filled 2019-09-20 (×4): qty 2
  Filled 2019-09-20: qty 1
  Filled 2019-09-20: qty 2
  Filled 2019-09-20: qty 1

## 2019-09-20 MED ORDER — SIMETHICONE 80 MG PO CHEW
80.0000 mg | CHEWABLE_TABLET | ORAL | Status: DC
  Administered 2019-09-21 – 2019-09-22 (×2): 80 mg via ORAL
  Filled 2019-09-20 (×2): qty 1

## 2019-09-20 MED ORDER — MENTHOL 3 MG MT LOZG: 1.0000 | LOZENGE | OROMUCOSAL | Status: DC | PRN

## 2019-09-20 MED ORDER — DIPHENHYDRAMINE HCL 25 MG PO CAPS: 25.0000 mg | ORAL_CAPSULE | Freq: Four times a day (QID) | ORAL | Status: DC | PRN

## 2019-09-20 MED ORDER — IBUPROFEN 800 MG PO TABS: 800.0000 mg | ORAL_TABLET | Freq: Four times a day (QID) | ORAL | Status: DC

## 2019-09-20 MED ORDER — ACETAMINOPHEN 500 MG PO TABS
1000.0000 mg | ORAL_TABLET | Freq: Four times a day (QID) | ORAL | Status: DC
  Administered 2019-09-20 – 2019-09-22 (×8): 1000 mg via ORAL
  Filled 2019-09-20 (×9): qty 2

## 2019-09-20 MED ORDER — WITCH HAZEL-GLYCERIN EX PADS: 1.0000 "application " | MEDICATED_PAD | CUTANEOUS | Status: DC | PRN

## 2019-09-20 MED ORDER — OXYTOCIN 40 UNITS IN NORMAL SALINE INFUSION - SIMPLE MED: 2.5000 [IU]/h | INTRAVENOUS | Status: AC

## 2019-09-20 MED ORDER — COCONUT OIL OIL: 1.0000 "application " | TOPICAL_OIL | Status: DC | PRN

## 2019-09-20 MED ORDER — TETANUS-DIPHTH-ACELL PERTUSSIS 5-2.5-18.5 LF-MCG/0.5 IM SUSP: 0.5000 mL | Freq: Once | INTRAMUSCULAR | Status: DC

## 2019-09-20 MED ORDER — FENTANYL CITRATE (PF) 100 MCG/2ML IJ SOLN: 50.0000 ug | INTRAMUSCULAR | Status: DC | PRN

## 2019-09-20 MED ORDER — SENNOSIDES-DOCUSATE SODIUM 8.6-50 MG PO TABS
2.0000 | ORAL_TABLET | ORAL | Status: DC
  Administered 2019-09-21 – 2019-09-22 (×2): 2 via ORAL
  Filled 2019-09-20 (×2): qty 2

## 2019-09-20 MED ORDER — KETOROLAC TROMETHAMINE 30 MG/ML IJ SOLN
30.0000 mg | Freq: Four times a day (QID) | INTRAMUSCULAR | Status: DC
  Administered 2019-09-20 (×2): 30 mg via INTRAVENOUS
  Filled 2019-09-20 (×3): qty 1

## 2019-09-20 MED ORDER — LACTATED RINGERS IV SOLN
INTRAVENOUS | Status: DC
  Administered 2019-09-20: 03:00:00 via INTRAVENOUS

## 2019-09-20 NOTE — Plan of Care (Signed)
Pts. Condition will continue to improve 

## 2019-09-20 NOTE — Progress Notes (Signed)
Patient ID: Sara Patel, female   DOB: [birth year], 33 y.o.   MRN: [id number] Subjective: POD# 1 emergency LTCS for cord prolapse Live born female  Birth Weight: 7 lb 11.1 oz (3490 g) APGAR: 2, 4  Newborn Delivery   Birth date/time: 09/19/2019 21:23:00 Delivery type: C-Section, Low Transverse Trial of labor: Yes C-section categorization: Primary     Baby name: [name] Delivering provider: Verita Schneiders A   Feeding: breast  Pain control at delivery: Epidural   Reports feeling well, still processing events of emergency cesarean section, but very pleased with outcome of healthy baby.   Patient reports tolerating PO.   Breast symptoms:none Pain controlled with ibuprofen (OTC) and Toradol Denies HA/SOB/C/P/N/V/dizziness. Flatus present. She reports vaginal bleeding as normal, without clots.  She has stood at [facility], foley cath in place, patent, clear yellow urine.     Objective:   VS:    Vitals:   09/20/19 0100 09/20/19 0200 09/20/19 0300 09/20/19 0600  BP: 137/60 126/67 128/65 121/75  Pulse: 98 74 76 78  Resp: 20 18 18 20   Temp: 100 F (37.8 C) 98.9 F (37.2 C) 98.9 F (37.2 C) 98.8 F (37.1 C)  TempSrc:      SpO2: 98% 98% 98% 97%  Weight:      Height:          Intake/Output Summary (Last 24 hours) at 09/20/2019 0738 Last data filed at 09/20/2019 0600 Gross per 24 hour  Intake 6434.9 ml  Output 5177 ml  Net 1257.9 ml       No results for input(s): WBC, HGB, HCT, PLT in the last 72 hours.   Blood type: --/--/O POS (10/27 2542)  Rubella: Immune (10/22 0000)  Vaccines: TDaP UTD         Flu    UTD   Physical Exam:  General: alert, cooperative and no distress CV: Regular rate and rhythm Resp: CTAB Abdomen: soft, NT, colostomy bag patent Incision: clean and dry pressure dressing in place Uterine Fundus: firm, below umbilicus, nontender Lochia: minimal Ext: edema +1 pedal and no redness or tenderness in the calves or thighs   Assessment/Plan: 33 y.o.    POD# 1. G1P1001                  Principal Problem:   Delivery by emergency cesarean - cord prolapse 10/29 Active Problems:   Crohn's colitis ([place])   Proteinuria affecting pregnancy, antepartum, third trimester   Refusal of blood transfusions as patient is Jehovah's Witness   Maternal cholestasis of pregnancy   Encounter for induction of labor   H/O total colectomy   Doing well, stable.    DC foley at 12 hrs postop           Advance diet as tolerated Encourage rest when baby rests Breastfeeding support Encourage to ambulate Routine post-op care  Juliene Pina, CNM, MSN 09/20/2019, 7:38 AM

## 2019-09-20 NOTE — Lactation Note (Signed)
This note was copied from a baby's chart. Lactation Consultation Note  Patient Name: Sara Patel [id number] Date: 09/20/2019 Reason for consult: Early term 37-38.6wks;Primapara Baby is 45 hours old.  Mom called out for latch assist.  Baby is currently in football hold skin to skin.  Baby is showing early feeding cues.  Mom did hand expression and large drop of colostrum expressed.  Parents shown how to compress breast tissue for a deeper latch.  Baby opened wide and latched easily.  Lips flanged.  Observed active feeding with occasional swallows.  Instructed to use breast massage during the feeding.  Breastfeeding consultation services information given and reviewed.  Maternal Data Has patient been taught Hand Expression?: Yes Does the patient have breastfeeding experience prior to this delivery?: No  Feeding Feeding Type: Breast Fed  LATCH Score Latch: Grasps breast easily, tongue down, lips flanged, rhythmical sucking.  Audible Swallowing: A few with stimulation  Type of Nipple: Everted at rest and after stimulation  Comfort (Breast/Nipple): Soft / non-tender  Hold (Positioning): Assistance needed to correctly position infant at breast and maintain latch.  LATCH Score: 8  Interventions Interventions: Breast feeding basics reviewed;Adjust position;Assisted with latch;Support pillows;Skin to skin;Position options;Breast massage;Expressed milk;Hand express  Lactation Tools Discussed/Used     Consult Status Consult Status: Follow-up Date: 09/21/19 Follow-up type: In-patient    Ave Filter 09/20/2019, 9:31 AM

## 2019-09-21 ENCOUNTER — Encounter (HOSPITAL_COMMUNITY): Payer: Self-pay

## 2019-09-21 MED ORDER — OXYCODONE HCL 5 MG PO TABS: 5.0000 mg | ORAL_TABLET | ORAL | 0 refills | Status: DC | PRN

## 2019-09-21 NOTE — Lactation Note (Signed)
This note was copied from a baby's chart. Lactation Consultation Note  Patient Name: Sara Patel [id number] Date: 09/21/2019 Reason for consult: Follow-up assessment;Early term 37-38.6wks;Primapara;1st time breastfeeding  P1 mother whose infant is now 5 hours old.  This is an ETI at 37+1 weeks.  Baby was asleep in father's arms when I arrived.  RN had set up the DEBP for mother and had a question about the pump (no suction from the right breast).  Troubleshooting completed and disassembled and reassembled pump parts.  Positioned the flange at an angle to mother's breast and the suction became apparent.  Reviewed the pump and pump settings.  Observed mother pumping and the #27 flange size is the appropriate size at this time.  Mother denied pain with pumping.    Mother has started supplementing with formula.  Asked mother to post pump for 15 minutes after every feeding session and to feed back any EBM she obtains to baby.  Mother will use her EBM prior to giving any formula.  Discussed how the supplementation can help minimize weight loss and jaundice in the ETI.  Mother willing to pump after every feeding.  Encouraged to feed at least 8-12 times or sooner if baby shows feeding cues.  Asked mother to awaken baby at three hours if she does not self awaken.  Suggested she call for latch assistance as needed.  Mother prefers to feed using the football hold.  Mother's breasts are large, soft and non tender and nipples are everted and intact.  She has a DEBP for home use.  She is willing to call for assistance as needed.  Father present and supportive.  Mother is tired and would like to rest following pumping and lunch.  Offered a quiet sign for her door.   Maternal Data Formula Feeding for Exclusion: No Has patient been taught Hand Expression?: Yes Does the patient have breastfeeding experience prior to this delivery?: No  Feeding Feeding Type: Bottle Fed - Formula Nipple Type: Slow -  flow  LATCH Score Latch: Grasps breast easily, tongue down, lips flanged, rhythmical sucking.  Audible Swallowing: Spontaneous and intermittent  Type of Nipple: Everted at rest and after stimulation  Comfort (Breast/Nipple): Soft / non-tender  Hold (Positioning): No assistance needed to correctly position infant at breast.  LATCH Score: 10  Interventions    Lactation Tools Discussed/Used Pump Review: Setup, frequency, and cleaning;Milk Storage Initiated by:: Paul Dykes Date initiated:: 09/21/19(RN set up; I reviewed with mother)   Consult Status Consult Status: Follow-up Date: 09/22/19 Follow-up type: In-patient    Bodi Palmeri R Braian Tijerina 09/21/2019, 12:20 PM

## 2019-09-21 NOTE — Progress Notes (Signed)
Sara Patel [id number] Postpartum Postoperative Day # 2  Sara Patel, G1P1001, [redacted]w[redacted]d, S/P primary CS LT Cesarean Section due to prolapse umbilical cord.   Subjective: The patient was taken to recovery in good condition.  Patient planned to breast feed.  On post-op day 1, patient was doing well, tolerating a regular diet, with Hgb of 10 and denies s/sx of anemia.  Throughout her stay, her physical exam was WNL, her incision was CDI, and her vital signs remained stable.  By post-op day 1, she was up ad lib, tolerating a regular diet, with good pain control with po med.  She was deemed to have received the full benefit of her hospital stay, and was discharged home in stable condition.  Contraceptive choice was declines. Pt taking tylenol and oxy IR for pain control.  Was tx 7 day course of flagyl for BV, and no c/o vaginal discharge or irritations, no smells. Had received several doses of NSAID, I discontinued them today due to chron's and not wanting to irritate her intestines, denies GI bleeding, denies abdomen pain, pt well versed in taking care of her colostomy, and it remains intact. Pt denies HA, RUQ pain or vision changes.  BP has remianed normotensive throughout her stay and today is 120/74 Pt requesting early discharge and meets criteria, but peds states newborn must stay. pt is stable. Patient up ad lib, denies syncope or dizziness. Reports consuming regular diet without issues and denies N/V. Patient reports 0 bowel movement + passing flatus.  Denies issues with urination and reports bleeding is "lighter."  Patient is breast feeding and reports going well.    Objective: Patient Vitals for the past 24 hrs:  BP Temp Temp src Pulse Resp SpO2  09/21/19 0945 120/74 98.3 F (36.8 C) Oral 87 16 98 %  09/21/19 0638 107/61 98.2 F (36.8 C) Oral 70 18 100 %  09/20/19 2234 (!) 107/57 97.7 F (36.5 C) Oral 87 16 100 %  09/20/19 1820 117/73 (!) 97.5 F (36.4 C) Oral 70 16 100 %  09/20/19 1420  111/82 98.6 F (37 C) Oral 71 18 97 %     Physical Exam:  General: alert, cooperative, appears stated age and no distress Mood/Affect: Happy Lungs: clear to auscultation, no wheezes, rales or rhonchi, symmetric air entry.  Heart: normal rate, regular rhythm, normal S1, S2, no murmurs, rubs, clicks or gallops. Breast: breasts appear normal, no suspicious masses, no skin or nipple changes or axillary nodes. Abdomen:  + bowel sounds, soft, non-tender, colostomy bag intact and in place, drainage well. Incision: healing well, no significant drainage, no dehiscence, no significant erythema, Honeycomb dressing  Uterine Fundus: firm, involution -1 Lochia: appropriate Skin: Warm, Dry. DVT Evaluation: No evidence of DVT seen on physical exam. Negative Homan's sign. No cords or calf tenderness. No significant calf/ankle edema.  Labs: Recent Labs    09/20/19 0614  HGB 10.0*  HCT 30.7*  WBC 17.4*    CBG (last 3)  No results for input(s): GLUCAP in the last 72 hours.   I/O: I/O last 3 completed shifts: In: 3470 [P.O.:720; I.V.:2750] Out: 5052 [Urine:3950; Blood:1102]   Assessment Postpartum Postoperative Day # 2. Hospital course:  Induction of Labor With Cesarean Section  33 y.o. G1P1001 at [redacted]w[redacted]d was admitted to the hospital 09/13/2019 for induction of labor. Patient had a labor course significant for G1P0 at 62w4dwas admitted for r/o preeclampsia without severe features, admitted dx was cholestasis of pregnancy, gestational proteinuria, with elevated LFT, anemia, chron's, bacterial  vaginosis. Pt is a Jehovah's witness and refuses blood products, will accept cryoprecipitate, products that can be recieved are on file now. Pt was IOL at 37 weeks for Cholestasis, labs remained stable no s/sx of preeclampsia. The patient went for cesarean section due to Cord Prolapse, and delivered a Viable infant,09/19/2019  Membrane Rupture Time/Date: 10:19 PM ,09/20/2019   Pt stable. -1 Inv[employer].  breastFeeding. Hemodynamically Stable, hgb was 10, denies s/s/x of anemia.  Plan: Continue other mgmt as ordered Anemia: monitor s/sx.  Cholestasis: monitor for s/sx.  Chrons: pt to take care of own colostomy bag, monitor for s/sx of intestinal bleeding.  VTE Prophylactics: SCD, ambulated as tolerates.  Pain control: Motrin/Tylenol/Narcotics PRN Education given regarding options for contraception, including barrier methods, injectable contraception, IUD placement, oral contraceptives.  Plan for discharge tomorrow, Breastfeeding and Lactation consult  Dr. Landry Mellow to be updated on patient status  [facility] NP-C, CNM 09/21/2019, 12:37 PM

## 2019-09-21 NOTE — Discharge Summary (Addendum)
Primary CS OB Discharge Summary     Patient Name: Sara Patel DOB: 06/28/1986 MRN: [id number]  Date of admission: 09/13/2019 Delivering MD: Jaynie Collins A  Date of delivery: 09/19/2019 Type of delivery: Primary LTCS  Newborn Data: Sex: Baby Female  Live born female  Birth Weight: 7 lb 11.1 oz (3490 g) APGAR: 2, 4  Newborn Delivery   Birth date/time: 09/19/2019 21:23:00 Delivery type: C-Section, Low Transverse Trial of labor: Yes C-section categorization: Primary      Feeding: breast Infant being discharge to home with mother in stable condition.   Admitting diagnosis: Preg Intrauterine pregnancy: [redacted]w[redacted]d     Secondary diagnosis:  Principal Problem:   Delivery by emergency cesarean - cord prolapse 10/29 Active Problems:   Crohn's colitis (HCC)   Proteinuria affecting pregnancy, antepartum, third trimester   Refusal of blood transfusions as patient is Jehovah's Witness   Maternal cholestasis of pregnancy   Encounter for induction of labor   H/O total colectomy   Normal postpartum course                                Complications: Umbilical cord prolapse                                                             Intrapartum Procedures: cesarean: low cervical, transverse and GBS prophylaxis Postpartum Procedures: none Complications-Operative and Postpartum: none Augmentation: AROM, Pitocin, Cytotec and Foley Balloon   History of Present Illness: Sara Patel is a 33 y.o. female, G1P1001, who presents at [redacted]w[redacted]d weeks gestation. The patient has been followed at  [facility]  Her pregnancy has been complicated by:  Patient Active Problem List   Diagnosis Date Noted  . Normal postpartum course 09/21/2019  . Delivery by emergency cesarean - cord prolapse 10/29 09/19/2019  . H/O total colectomy 09/19/2019  . Maternal cholestasis of pregnancy 09/18/2019  . Encounter for induction of labor 09/18/2019  . Proteinuria  affecting pregnancy, antepartum, third trimester 09/14/2019  . Refusal of blood transfusions as patient is Jehovah's Witness 09/14/2019  . Right kidney mass 02/05/2016  . Frequent UTI 01/18/2016  . History of pancreatitis 09/30/2015  . SI (sacroiliac) pain 09/29/2015  . 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 S/P ileostomy (HCC) 09/07/2015  . Crohn's colitis (HCC) 08/29/2015    Hospital course:  Induction of Labor With Cesarean Section  33 y.o. yo G1P1001 at [redacted]w[redacted]d was admitted to the hospital 09/13/2019 for induction of labor. Patient had a labor course significant for G1P0 at [redacted]w[redacted]d was admitted for r/o preeclampsia without severe features, admitted dx was cholestasis of pregnancy, gestational proteinuria, with elevated LFT, anemia, chron's, bacterial vaginosis. Sara Patel is a Jehovah's witness and refuses blood products, will accept cryoprecipitate, products that can be recieved are on file now. Sara Patel was IOL at 37 weeks for Cholestasis, labs remained stable no s/sx of preeclampsia. The patient went for cesarean section due to Cord Prolapse, and delivered a Viable infant,09/19/2019  Membrane Rupture Time/Date: 10:19 PM ,09/20/2019   Details of operation can be found in separate operative Note.  Patient had an uncomplicated postpartum course. She is ambulating, tolerating a regular diet, passing flatus, and urinating well.  Patient is discharged home in  stable condition on 09/22/19.                                   Hospital Course--Unscheduled Cesarean:  Admitted 09/18/2019. Positve GBS and was tx with penicillin x5 doses.  Utilized epidural for pain management.   Had emergent primary CS.  Due to cord prolapse, she was consented for cesarean, with Dr. Harolyn Rutherford performing a primary LTCS under epdiural anesthesia, with delivery of a viable baby female, with weight and Apgars as listed below. Infant was in good condition and remained at the patient's bedside.  The patient was taken to recovery in good condition.  Patient  planned to breast feed.  On post-op day 1, patient was doing well, tolerating a regular diet, with Hgb of 10 and denies s/sx of anemia.  Throughout her stay, her physical exam was WNL, her incision was CDI, and her vital signs remained stable.  By post-op day 1, she was up ad lib, tolerating a regular diet, with good pain control with po med.  She was deemed to have received the full benefit of her hospital stay, and was discharged home in stable condition.  Contraceptive choice was declines. Sara Patel to go home with oxy IR and tylenol for pain control.   Was tx 7 day course of flagyl for BV, and no c/o vaginal discharge or irritations, no smells. Had received several doses of NSAID, I discontinued them today due to chron's and not wanting to irritate her intestines, denies GI bleeding, denies abdomen pain, Sara Patel well versed in taking care of her colostomy, and it remains intact. Sara Patel denies HA, RUQ pain or vision changes.  BP has remianed normotensive throughout her stay and today is 126/80.Paper given on education for preeclampsia and s/sx to report, Sara Patel instructed to get BP cuff and monitor BP at home, if >150/90s report. Dr Landry Mellow aware of discharge.   Physical exam  Vitals:   09/21/19 0638 09/21/19 0945 09/21/19 2221 09/22/19 0539  BP: 107/61 120/74 (!) 142/75 126/80  Pulse: 70 87 86 85  Resp: 18 16 17 18   Temp: 98.2 F (36.8 C) 98.3 F (36.8 C) 97.9 F (36.6 C) 99.2 F (37.3 C)  TempSrc: Oral Oral Oral Oral  SpO2: 100% 98%  99%  Weight:      Height:       General: alert, cooperative and no distress Lochia: appropriate Uterine Fundus: firm Incision: Healing well with no significant drainage, No significant erythema, Dressing is clean, dry, and intact, honeycomb dressing CDI Perineum: Intact DVT Evaluation: No evidence of DVT seen on physical exam. Negative Homan's sign. No cords or calf tenderness. No significant calf/ankle edema.  Labs: Lab Results  Component Value Date   WBC 17.4 (H)  09/20/2019   HGB 10.0 (L) 09/20/2019   HCT 30.7 (L) 09/20/2019   MCV 89.2 09/20/2019   PLT 177 09/20/2019   CMP Latest Ref Rng & Units 09/17/2019  Glucose 70 - 99 mg/dL 96  BUN 6 - 20 mg/dL 9  Creatinine 0.44 - 1.00 mg/dL 0.89  Sodium 135 - 145 mmol/L 136  Potassium 3.5 - 5.1 mmol/L 3.7  Chloride 98 - 111 mmol/L 108  CO2 22 - 32 mmol/L 22  Calcium 8.9 - 10.3 mg/dL 9.5  Total Protein 6.5 - 8.1 g/dL 6.3(L)  Total Bilirubin 0.3 - 1.2 mg/dL 0.4  Alkaline Phos 38 - 126 U/L 162(H)  AST 15 - 41 U/L 36  ALT 0 - 44 U/L 46(H)    Date of discharge: 09/22/2019 Discharge Diagnoses: Term Pregnancy-delivered Discharge instruction: per After Visit Summary and "Baby and Me Booklet".  After visit meds:   Activity:           unrestricted and pelvic rest Advance as tolerated. Pelvic rest for 6 weeks.  Diet:                routine Medications: PNV and oxy IR, tylenol  Postpartum contraception: None Condition:  Sara Patel discharge to home with baby in stable Anemia: F/U with hematology PRN.  Chron's: F/U with gastroenterology PRN, avoid asa, motrin, and continue medication regime with humira as directed by gastroenterology.   Meds: Allergies as of 09/22/2019      Reactions   Ferrlecit [na Ferric Gluc Cplx In Sucrose] Anaphylaxis   Allergy occurred in January 2016. Sara Patel reported feeling like her 'lungs were collapsing', chest tightness, itching, and a rash after receiving this in clinic. [facility] tx of anaphylaxis   Iron Dextran Anaphylaxis      Medication List    STOP taking these medications   clindamycin 300 MG capsule Commonly known as: CLEOCIN   ondansetron 4 MG tablet Commonly known as: ZOFRAN     TAKE these medications   acetaminophen 325 MG tablet Commonly known as: TYLENOL Take 650 mg by mouth every 6 (six) hours as needed (pain).   calcium carbonate 500 MG chewable tablet Commonly known as: TUMS - dosed in mg elemental calcium Chew 2 tablets  by mouth as needed for indigestion or heartburn.   [employer] D3 50 MCG (2000 UT) Caps Generic drug: Cholecalciferol Take 2,000 Units by mouth daily.   doxylamine (Sleep) 25 MG tablet Commonly known as: UNISOM Take 25 mg by mouth at bedtime as needed for sleep.   ferrous gluconate 324 MG tablet Commonly known as: FERGON Take 324 mg by mouth daily.   Humira Pen 40 MG/0.8ML Pnkt Generic drug: Adalimumab Inject 40 mg into the skin every 7 (seven) days. Sunday   omeprazole 20 MG capsule Commonly known as: PRILOSEC Take 20 mg by mouth daily.   oxyCODONE 5 MG immediate release tablet Commonly known as: Oxy IR/ROXICODONE Take 1-2 tablets (5-10 mg total) by mouth every 4 (four) hours as needed for moderate pain.   prenatal multivitamin Tabs tablet Take 1 tablet by mouth daily at 12 noon.            Discharge Care Instructions  (From admission, onward)         Start     Ordered   09/21/19 0000  Discharge wound care:    Comments: Take dressing off on day 5-7 postpartum.  Report increased drainage, redness or warmth. Clean with water, let soap trickle down body. Can leave steri strips on until they fall off or take them off gently at day 10. Keep open to air, clean and dry.   09/21/19 [id number]          Discharge Follow Up:  Follow-up Information    [facility] in 6 week(s).   Specialty: Obstetrics and Gynecology Contact information: [address] [place] [phone]           [place]HudsonJade Quincie Haroon, NP-C, CNM 09/22/2019, 9:01 AM  Dale DurhamJade Deysi Soldo, FNP

## 2019-09-22 ENCOUNTER — Encounter (HOSPITAL_COMMUNITY): Payer: 59

## 2019-09-22 NOTE — Lactation Note (Signed)
This note was copied from a baby's chart. Lactation Consultation Note  Patient Name: Sara Patel QQIWL'N Date: 09/22/2019 Reason for consult: Follow-up assessment;Early term 37-38.6wks;Infant weight loss;Primapara;1st time breastfeeding  78 hours old ETI female who is being partially BF and formula fed by her mother, she's a P1. Mom and baby are going home today, baby is at 4% weight loss. Parents have been diligent with supplementing with Similac 20 calorie formula every 3 hours but not with pumping. Mom told LC she has pumped twice yesterday but nothing came out.  Explained to mom that the purpose of pumping early on is mainly for breast stimulation and not to get volume and urged to start pumping consistently every 3 hours. Mom also aware that she may have to continue pumping for a few more days/weeks after discharge to protect her supply because baby may not be able to fully empty the breast. Baby asleep when entering the room, mom told Artas she just had a feeding and didn't need any assistance with latch at this point.  Reviewed discharge instructions, engorgement prevention and treatment, treatment/prevention of sore nipples, cluster feeding, supply/demand, red flags on when to call baby's pediatrician and pumping schedule. Parents aware that baby's feeding plan will be revised with pediatrician on the next appt and that it will most likely change as baby gets older. Parents reported all questions and concerns were answered, they're both aware of [facility] OP services and will call PRN.  Maternal Data    Feeding Feeding Type: Bottle Fed - Formula  LATCH Score                   Interventions Interventions: Breast feeding basics reviewed  Lactation Tools Discussed/Used     Consult Status Consult Status: Complete Date: 09/22/19 Follow-up type: Call as needed    Sara Patel 09/22/2019, 12:58 PM

## 2019-09-24 LAB — SURGICAL PATHOLOGY

## 2019-10-02 ENCOUNTER — Encounter (HOSPITAL_COMMUNITY): Payer: 59

## 2019-12-04 ENCOUNTER — Ambulatory Visit: Payer: Self-pay

## 2019-12-04 NOTE — Lactation Note (Signed)
This note was copied from a baby's chart. Lactation Consultation Note  Patient Name: Sara Patel [id number] Date: 12/04/2019     12/04/2019  Name: Sara Patel MRN: [id number] Date of Birth: 09/19/2021 Gestational Age: Gestational Age: 80w1dBirth Weight: 123.1 oz Weight today:  Weight: 12 lb 1.8 oz (5287g)   25month old ET infant presents today with mom and dad for feeding assessment.   Mom would like help with latching and building milk supply.   Infant has gained 2139 grams in the last 73 days with an average daily weight gain of 29 grams a day.   Mom reports infant would latch well in the beginning and then got to the point where infant will fight latching.   Mom is pumping about 5 x a day and getting 40 ml-240 ounces. Infant gets EBM during the day and mostly formula at night. Mom reports she changed to # 28 flanges due to pain and was noting that a lot of nipple tissue is pulling into barrel. Reviewed trying the # 24 flanges with lubrication and maintain suction at tolerable level. Enc mom to relax with pumping. Massaging while using her hands free bra. Mom is not pumping at all at night. Reviewed supply and demand and enc mom not to go more than 5-6 hours at night with no pumping.   Infant is using the Dr. BSaul Fordycebottles for feedings. Infant was using the Avent and was choking so mom changed to Dr. BSaul Fordyceand noted infant choked less.   Attempted to latch infant, she would latch for a few seconds and pull off and cry, tried after offering her a bottle and she did the same. Reviewed pre pumping to get milk flowing and offering breast when she is sleepy. Reviewed dream feedings.   Mom with Crohns disease, would not recommend Fenugreek for her. Mom has a history of panic attacks with Reglan. [place]ed [place] help.   Infant to follow up Dr. ACarolynn Sayersin February at 4 months. Infant to follow up with Lactation as needed, mom to call back as needed.      General Information: Mother's reason for visit: assistance with latch, help with supply Consult: Initial Lactation consultant: SNonah MattesRN,IBCLC Breastfeeding experience: not latching well Maternal medical conditions: Other, History post partum depression(Crohns, pt has talked to OB about depression, she has not started meds) Maternal medications: Pre-natal vitamin, Other(Folic Acid, Vitamin D, Humira, 5HTP)  Breastfeeding History: Frequency of breast feeding: not latchin Duration of feeding: few sucks  Supplementation: Supplement method: bottle(Dr. BRoosvelt Harpsand Avent) Brand: Similac Formula volume: 2.5-4 ounces Formula frequency: 3-4 feedings a day   Breast milk volume: 2.5-4 ounces Breast milk frequency: 3-4 times a day   Pump type: Spectra(Ameda also) Pump frequency: 5 x a day Pump volume: 40-240 ml  Infant Output Assessment: Voids per 24 hours: 8+ Urine color: Clear yellow Stools per 24 hours: 5 Stool color: Yellow  Breast Assessment: Breast: Soft, Compressible Nipple: Erect Pain level: 0 Pain interventions: Bra, Breast pump, Expressed breast milk  Feeding Assessment: Infant oral assessment: Variance Infant oral assessment comment: Infant with labial frenulum, lip flanges well on the breast and bottle. Infant with posterior lingual frenulum noted, she seems to have good tongue mobility. Mom with no soreness with latching and nipple rounded post feeding. Positioning: Cross cradle(attempted to latch to both breasts) Latch: 0 - Too sleepy or reluctant, no latch achieved, no sucking elicited. Audible swallowing: 0 - None Type of  nipple: 2 - Everted at rest and after stimulation Comfort: 2 - Soft/non-tender Hold: 1 - Assistance needed to correctly position infant at breast and maintain latch LATCH score: 5 Latch assessment: Deep Lips flanged: Yes Suck assessment: Nonnutritive   Pre-feed weight: 5494 grams Post feed weight: did not reweigh       Additional Feeding Assessment:                                    Totals: Total amount transferred: 0 Total supplement given: 3 ounces of EBM via bottle Total amount pumped post feed: did not pump   Plan:   1. Offer breast with feeding cues as mom and infant want, try to latch her when she is sleepy 2. Offer both breasts with each feeding 3. Empty the first breast before offering the second breast 4. Offer infant a bottle of pumped milk or formula after breast feeding if she is still cueing to feed 5. Offer infant the bottle using the paced bottle feeding method (video on kellymom.com) 6. Infant needs about 101-135 ml (3.5-4.5 ounces) for 8 feedings a day or [phone] ml (27-36 ounces) in 24 hours. Infant may take more or less depending on how often she feeds. Feed infant until she is satisfied.  7. Would attempt to increase your pumping to about 8 x a day to promote and protect milk supply, Try to pump any time infant is getting a bottle. 8. Would recommend you try power pumping once a day (pump for 20 minutes, rest for 20 minutes, pump for 10 minutes, rest for 10 minutes, pump for 10 minutes. Use your hands free bra and massage breasts with feeding. 9. Would recommend that you try the # 24 flanges and lubricate with Coconut oil before pumping 10. [employer] has several products that are Fenugreek Free that may help with your milk supply 11. Keep up the good work 38. Thank you for allowing me to assist you today 13. Please call with any questions and concerns as needed [phone] 14. Follow up with Lactation as needed  Donn Pierini RN, IBCLC                                                     Debby Freiberg Marlette Curvin 12/04/2019, 3:21 PM

## 2020-01-07 ENCOUNTER — Encounter (INDEPENDENT_AMBULATORY_CARE_PROVIDER_SITE_OTHER): Payer: Self-pay | Admitting: Family Medicine

## 2020-01-07 ENCOUNTER — Other Ambulatory Visit: Payer: Self-pay

## 2020-01-07 ENCOUNTER — Ambulatory Visit (INDEPENDENT_AMBULATORY_CARE_PROVIDER_SITE_OTHER): Payer: 59 | Admitting: Family Medicine

## 2020-01-07 VITALS — BP 110/71 | HR 79 | Temp 98.7°F | Ht 63.0 in | Wt 186.0 lb

## 2020-01-07 DIAGNOSIS — K50119 Crohn's disease of large intestine with unspecified complications: Secondary | ICD-10-CM

## 2020-01-07 DIAGNOSIS — R0602 Shortness of breath: Secondary | ICD-10-CM

## 2020-01-07 DIAGNOSIS — F418 Other specified anxiety disorders: Secondary | ICD-10-CM | POA: Diagnosis not present

## 2020-01-07 DIAGNOSIS — R5383 Other fatigue: Secondary | ICD-10-CM | POA: Diagnosis not present

## 2020-01-07 DIAGNOSIS — E559 Vitamin D deficiency, unspecified: Secondary | ICD-10-CM

## 2020-01-07 DIAGNOSIS — Z9189 Other specified personal risk factors, not elsewhere classified: Secondary | ICD-10-CM

## 2020-01-07 DIAGNOSIS — R739 Hyperglycemia, unspecified: Secondary | ICD-10-CM | POA: Diagnosis not present

## 2020-01-07 DIAGNOSIS — E66811 Obesity, class 1: Secondary | ICD-10-CM

## 2020-01-07 DIAGNOSIS — E669 Obesity, unspecified: Secondary | ICD-10-CM

## 2020-01-07 DIAGNOSIS — Z6833 Body mass index (BMI) 33.0-33.9, adult: Secondary | ICD-10-CM

## 2020-01-07 DIAGNOSIS — D508 Other iron deficiency anemias: Secondary | ICD-10-CM

## 2020-01-07 DIAGNOSIS — Z0289 Encounter for other administrative examinations: Secondary | ICD-10-CM

## 2020-01-07 NOTE — Progress Notes (Signed)
Chief Complaint:   OBESITY Sara Patel (MR# [id number]) is a 34 y.o. female who presents for evaluation and treatment of obesity and related comorbidities. Current BMI is Body mass index is 32.95 kg/m. Sara Patel has been struggling with her weight for many years and has been unsuccessful in either losing weight, maintaining weight loss, or reaching her healthy weight goal.  Sara Patel is breastfeeding her 69 month old daughter.  Sara Patel is currently in the action stage of change and ready to dedicate time achieving and maintaining a healthier weight. Sara Patel is interested in becoming our patient and working on intensive lifestyle modifications including (but not limited to) diet and exercise for weight loss.  Sara Patel's habits were reviewed today and are as follows: Her family eats meals together, she thinks her family will eat healthier with her, her desired weight loss is 41 lbs, she started gaining weight in her 30's, her heaviest weight ever was 175 pounds, she has significant food cravings issues, she snacks frequently in the evenings, she frequently makes poor food choices and she struggles with emotional eating.  Depression Screen Sara Patel's Food and Mood (modified PHQ-9) score was 15.  Depression screen PHQ 2/9 01/07/2020  Decreased Interest 1  Down, Depressed, Hopeless 2  PHQ - 2 Score 3  Altered sleeping 1  Tired, decreased energy 3  Change in appetite 3  Feeling bad or failure about yourself  2  Trouble concentrating 3  Moving slowly or fidgety/restless 0  Suicidal thoughts 0  PHQ-9 Score 15  Difficult doing work/chores Not difficult at all   Subjective:   1. Other fatigue Sara Patel admits to daytime somnolence and admits to waking up still tired. Sara Patel has a history of symptoms of daytime fatigue. Sara Patel generally gets 3 or 5 hours of sleep per night, and states that she has nightime awakenings. Snoring is present. Apneic episodes are not present.  Epworth Sleepiness Score is 3.  2. Shortness of breath on exertion Sara Patel notes increasing shortness of breath with exercising and seems to be worsening over time with weight gain. She notes getting out of breath sooner with activity than she used to. This has not gotten worse recently. Sara Patel denies shortness of breath at rest or orthopnea.  3. Hyperglycemia Sara Patel has a history of some elevated blood sugars. She denies gestational diabetes mellitus.  4. Vitamin D deficiency Sara Patel has a history of Vit D deficiency. She has no recent labs, and she notes fatigue.  5. Crohn's disease of colon with complication (Sara Patel) Sara Patel is on Humira, and she is status post ileostomy and total colectomy, and has a permanent ileostomy. She does not not have food restrictions.  6. Depression with anxiety Sara Patel notes emotional eating, and she is 3 months postpartum. She denies suicidal ideas or homicidal ideas.  7. Other iron deficiency anemia Sara Patel has a history of anemia. 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 She had a C-section 3 months ago.  8. At risk for malnutrition Sara Patel is at increased risk for malnutrition due to Crohn's disease.  Assessment/Plan:   1. Other fatigue Sara Patel does feel that her weight is causing her energy to be lower than it should be. Fatigue may be related to obesity, depression or many other causes. Labs will be ordered, and in the meanwhile, Sara Patel will focus on self care including making healthy food choices, increasing physical activity and focusing on stress reduction.  - EKG 12-Lead - Vitamin B12 - CBC with Differential/Platelet - Folate -  T3 - T4, free - TSH  2. Shortness of breath on exertion Sara Patel does feel that she gets out of breath more easily that she used to when she exercises. Sara Patel's shortness of breath appears to be obesity related and exercise induced. She has agreed to work on weight loss  and gradually increase exercise to treat her exercise induced shortness of breath. Will continue to monitor closely.  - Lipid Panel With LDL/HDL Ratio  3. Hyperglycemia Fasting labs will be obtained today and results with be discussed with Sara Patel in 2 weeks at her follow up visit. In the meanwhile Sara Patel was started on a lower simple carbohydrate diet and will work on weight loss efforts.  - Hemoglobin A1c - Insulin, random - Comprehensive metabolic panel  4. Vitamin D deficiency Low Vitamin D level contributes to fatigue and are associated with obesity, breast, and colon cancer. We will check labs today. Sara Patel will follow-up for routine testing of Vitamin D, at least 2-3 times per year to avoid over-replacement.  - VITAMIN D 25 Hydroxy (Vit-D Deficiency, Fractures)  5. Crohn's disease of colon with complication (HCC) Sara Patel will start her diet prescription. We will check electrolytes today and will follow up.  - Magnesium  6. Depression with anxiety Behavior modification techniques were discussed today to help Sara Patel deal with her emotional/non-hunger eating behaviors. We will refer to Dr. Dewaine Patel, our Bariatric Psychologist for evaluation. Orders and follow up as documented in patient record.   7. Other iron deficiency anemia We will check labs today. Orders and follow up as documented in patient record.  - Anemia panel  8. At risk for malnutrition Sara Patel was given approximately 30 minutes of counseling today regarding prevention of malnutrition. Sara Patel was advised that having Crohn's increases her risk for anemia, malnutrition, and vitamin deficiencies.   9. Class 1 obesity with serious comorbidity and body mass index (BMI) of 33.0 to 33.9 in adult, unspecified obesity type Sara Patel is currently in the action stage of change and her goal is to continue with weight loss efforts. I recommend Sara Patel begin the structured treatment plan as follows:  She has  agreed to the Category 2 Plan.  Exercise goals: No exercise has been prescribed for now, while we concentrate on nutritional changes.   Behavioral modification strategies: increasing lean protein intake and meal planning and cooking strategies.  She was informed of the importance of frequent follow-up visits to maximize her success with intensive lifestyle modifications for her multiple health conditions. She was informed we would discuss her lab results at her next visit unless there is a critical issue that needs to be addressed sooner. Simon agreed to keep her next visit at the agreed upon time to discuss these results.  Objective:   Blood pressure 110/71, pulse 79, temperature 98.7 F (37.1 C), temperature source Oral, height 5\' 3"  (1.6 m), weight 186 lb (84.4 kg), SpO2 100 %, unknown if currently breastfeeding. Body mass index is 32.95 kg/m.  EKG: Normal sinus rhythm, rate 68 BPM.  Indirect Calorimeter completed today shows a VO2 of 211 and a REE of 1472.  Her calculated basal metabolic rate is thus her basal metabolic rate is worse than expected.  General: Cooperative, alert, well developed, in no acute distress. HEENT: Conjunctivae and lids unremarkable. Cardiovascular: Regular rhythm.  Lungs: Normal work of breathing. Neurologic: No focal deficits.   Lab Results  Component Value Date   CREATININE 0.89 09/17/2019   BUN 9 09/17/2019   NA 136 09/17/2019  K 3.7 09/17/2019   CL 108 09/17/2019   CO2 22 09/17/2019   Lab Results  Component Value Date   ALT 46 (H) 09/17/2019   AST 36 09/17/2019   ALKPHOS 162 (H) 09/17/2019   BILITOT 0.4 09/17/2019   Lab Results  Component Value Date   HGBA1C 5.7 01/18/2016   No results found for: INSULIN Lab Results  Component Value Date   TSH 0.296 (L) 03/13/2013   Lab Results  Component Value Date   CHOL 85 03/08/2013   HDL 34 (L) 03/08/2013   LDLCALC 38 03/08/2013   TRIG 65 03/08/2013   CHOLHDL 2.5 03/08/2013   Lab  Results  Component Value Date   WBC 17.4 (H) 09/20/2019   HGB 10.0 (L) 09/20/2019   HCT 30.7 (L) 09/20/2019   MCV 89.2 09/20/2019   PLT 177 09/20/2019   No results found for: IRON, TIBC, FERRITIN  Attestation Statements:   Reviewed by clinician on day of visit: allergies, medications, problem list, medical history, surgical history, family history, social history, and previous encounter notes.   I, Burt Knack, am acting as transcriptionist for Quillian Quince, MD.  I have reviewed the above documentation for accuracy and completeness, and I agree with the above. - Quillian Quince, MD

## 2020-01-07 NOTE — Progress Notes (Signed)
Office: [phone]  /  Fax: [phone]    Date: January 14, 2020   Appointment Start Time: 3:00pm Duration: 48 minutes Provider: Glennie Patel, Psy.D. Type of Session: Intake for Individual Therapy  Location of Patient: Home Location of Provider: Provider's Home Type of Contact: Telepsychological Visit via [employer] WebEx  Informed Consent: Prior to proceeding with today's appointment, two pieces of identifying information were obtained. In addition, Sara Patel's physical location at the time of this appointment was obtained Sara Patel well a phone number she could be reached at in the event of technical difficulties. Sara Patel and this provider participated in today's telepsychological service.   The provider's role was explained to Sara Patel. The provider reviewed and discussed issues of confidentiality, privacy, and limits therein (e.g., reporting obligations). In addition to verbal informed consent, written informed consent for psychological services was obtained prior to the initial appointment. Since the clinic is not a 24/7 crisis center, mental health emergency resources were shared and this  provider explained MyChart, e-mail, voicemail, and/or other messaging systems should be utilized only for non-emergency reasons. This provider also explained that information obtained during appointments will be placed in Sara Patel's medical record and relevant information will be shared with other providers at [facility] for coordination of care. Moreover, Sara Patel agreed information may be shared with other Sara Patel providers Sara Patel needed for coordination of care. By signing the service agreement document, Sara Patel provided written consent for coordination of care. Prior to initiating telepsychological services, Sara Patel completed an informed consent document, which included the development of a safety plan (i.e., an emergency contact, nearest emergency room, and  emergency resources) in the event of an emergency/crisis. Sara Patel expressed understanding of the rationale of the safety plan. Sara Patel verbally acknowledged understanding she is ultimately responsible for understanding her insurance benefits for telepsychological and in-person services. This provider also reviewed confidentiality, Sara Patel it relates to telepsychological services, Sara Patel well Sara Patel the rationale for telepsychological services (i.e., to reduce exposure risk to COVID-19). Sara Patel  acknowledged understanding that appointments cannot be recorded without both party consent and she is aware she is responsible for securing confidentiality on her end of the session. Sara Patel verbally consented to proceed.  Chief Complaint/HPI: Sara Patel was referred by Dr. Dennard Patel due to depression with anxiety. Per the note for the initial visit with Dr. Dennard Patel on January 07, 2020, "Sara Patel notes emotional eating, and she is 3 months postpartum. She denies suicidal ideas or homicidal ideas." The note for the initial appointment with Dr. Leafy Patel also indicated the following: "Her family eats meals together, she thinks her family will eat healthier with her, her desired weight loss is 41 lbs, she started gaining weight in her 30's, her heaviest weight ever was 175 pounds, she has significant food cravings issues, she snacks frequently in the evenings, she frequently makes poor food choices and she struggles with emotional eating." Sara Patel's Food and Mood (modified PHQ-9) score on January 07, 2020 was 15.  During today's appointment, Sara Patel was verbally administered a questionnaire assessing various behaviors related to emotional eating. Sara Patel endorsed the following: experience food cravings on a regular basis, eat certain foods when you are anxious, stressed, depressed, or your feelings are hurt, use food to help you cope with emotional situations, find food is comforting to you, overeat when you are  worried about something, overeat frequently when you are bored or lonely, overeat when you are alone, but eat much less when you are with other people, eat to help you  stay awake and eat Sara Patel a reward. She shared she craves sugar (e.g., candy, cake, bread). Sara Patel believes the onset of emotional eating was likely around her late teens/early 65s and described the current frequency of emotional eating Sara Patel "few times a week." In addition, Sara Patel denied a history of binge eating; however, discussed eating larger portions. Sara Patel denied a history of purging and engagement in other compensatory strategies, and has never been diagnosed with an eating disorder. She also denied a history of treatment for emotional eating. However, she stated approximately three years ago she restricted food intake after a surgery Sara Patel she felt that was the only thing she could "control." Moreover, Sara Patel indicated anxiety, stress, and worry triggers emotional eating, whereas exercise and walking makes emotional eating better. Furthermore, Sara Patel discussed feeling overwhelmed with being a new parent and going back to work.   Mental Status Examination:  Appearance: well groomed and appropriate hygiene  Behavior: appropriate to circumstances Mood: "overwhelmed" Affect: mood congruent; tearful Speech: normal in rate, volume, and tone Eye Contact: appropriate Psychomotor Activity: appropriate Gait: unable to assess  Thought Process: linear, logical, and goal directed  Thought Content/Perception: denies suicidal and homicidal ideation, plan, and intent and no hallucinations, delusions, bizarre thinking or behavior reported or observed Orientation: time, person, place and purpose of appointment Memory/Concentration: memory, attention, language, and fund of knowledge intact  Insight/Judgment: good  Family & Psychosocial History: Sara Patel reported she is married and she has a daughter (age 16 months). 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 Additionally, Sara Patel shared her highest level of education obtained is a bachelor's degree. Currently, Sara Patel's social support system consists of her spouse, sister, close friend (resides in [place]), congregation, and co-workers. Moreover, Kamea stated she resides with her husband and daughter.   Medical History:  Past Medical History:  Diagnosis Date  . Anemia   . Anxiety   . Back pain   . Crohn disease ([place]) 2000   Diagnosed in 2000. GI at [facility]  . Depression   . GERD (gastroesophageal reflux disease)   . History of stomach ulcers   . Joint pain   . Pancreatitis   . Vitamin D deficiency    Past Surgical History:  Procedure Laterality Date  . ABDOMINAL SURGERY     2006, 2011 resections   . CESAREAN SECTION N/A 09/19/2019   Procedure: CESAREAN SECTION;  Surgeon: Osborne Oman, MD;  Location: [facility] LD ORS;  Service: Obstetrics;  Laterality: N/A;  . ILEOSTOMY  2003  . rectal abscess  2014   . TOTAL COLECTOMY     Current Outpatient Medications on File Prior to Visit  Medication Sig Dispense Refill  . acetaminophen (TYLENOL) 325 MG tablet Take 650 mg by mouth every 6 (six) hours Sara Patel needed (pain).    . Adalimumab (HUMIRA PEN) 40 MG/0.8ML PNKT Inject 40 mg into the skin every 7 (seven) days. Sunday    . calcium carbonate (TUMS - DOSED IN MG ELEMENTAL CALCIUM) 500 MG chewable tablet Chew 2 tablets by mouth Sara Patel needed for indigestion or heartburn.    . [employer] D3 50 MCG (2000 UT) CAPS Take 2,000 Units by mouth daily.    . ferrous gluconate (FERGON) 324 MG tablet Take 324 mg by mouth daily.    . Prenatal Vit-Fe Fumarate-FA (PRENATAL MULTIVITAMIN) TABS tablet Take 1 tablet by mouth daily at 12 noon.     No current facility-administered medications on file prior to visit.  Sara Patel denied a  history of head injuries and loss of consciousness.    Mental Health History: Shambria reported she first sought therapeutic  services three years ago due to depression and panic attacks after her surgery. She acknowledged she experienced passive suicidal ideation (i.e., "I wish I could go to sleep and not wake up.") Currently, Sara Patel reported she meets with Sara Patel, LPC, LCAS-R at [facility] for "re- occurrence of depression" after having her baby. She stated she initiated services two months ago and they meet once every two weeks. Sara Patel stated their next appointment is on January 17, 2020. She noted she would inform Sara Patel about meeting with this provider and she was agreeable to signing an authorization for coordination of care if needed.  Asmi reported there is no history of hospitalizations for psychiatric concerns. She reported she met with a psychiatrist in 2018 to discuss medication options; however, she noted she was not ready "to commit to medications." She was prescribed Xanax PRN. Currently, she reported she is not prescribed any psychotropic medications. Daylani denied a family history of mental health related concerns. Tessah reported there is no history of trauma including psychological, physical  and sexual abuse, Sara Patel well Sara Patel neglect.   Estelene described her typical mood lately Sara Patel "sad and anxious, but functional." Aside from concerns noted above and endorsed on the PHQ-9 and GAD-7, Shaniqwa reported experiencing crying spells; social withdrawal; decreased motivation; worry thoughts about overall well-being and daughter's well-being; and panic attacks. She described the frequency of panic attacks Sara Patel 2-3xs weekly, and shared they are characterized by difficulty breathing and feeling dizzy/faint. She reported she copes by engaging in self-talk. She also reported experiencing hopelessness Sara Patel it relates to when she will emotionally feel better. She denied experiencing suicidal ideation, noting, "It will get better eventually." Ettie denied current alcohol use. She denied tobacco use.  She denied illicit/recreational substance use. Regarding caffeine intake, Makyla reported consuming one cup of coffee daily. Furthermore, Akasha indicated she is not experiencing the following: hallucinations and delusions, paranoia and symptoms of mania (e.g., expansive mood, flighty ideas, decreased need for sleep, engagement in risky behaviors). She also denied current suicidal ideation, plan, and intent; history of and current homicidal ideation, plan, and intent; and history of and current engagement in self-harm.  Chandelle reported she first experienced suicidal ideation in 2018. She denied experiencing suicidal plan and intent. She denied a history of suicide attempts. She last experienced suicidal ideation in "early 2019." The following protective factors were identified for Ambriana: family and self. She added, "My daughter is my biggest motivation." If she were to become overwhelmed in the future, which is a sign that a crisis may occur, she identified the following coping skills she could engage in: be open with her support system, spend time with daughter, exercise, and watch TV. It was recommended the aforementioned be written down and developed into a coping card for future reference; she agreed. Psychoeducation regarding the importance of reaching out to a trusted individual and/or utilizing emergency resources if there is a change in emotional status and/or there is an inability to ensure safety was provided. Emersen's confidence in reaching out to a trusted individual and/or utilizing emergency resources should there be an intensification in emotional status and/or there is an inability to ensure safety was assessed on a scale of one to ten where one is not confident and ten is extremely confident. She reported her confidence is a 10. Additionally, Clotilda denied current access to firearms and/or weapons.  The following strengths were reported by Tasha: resilient and strong. The  following strengths were observed by this provider: ability to express thoughts and feelings during the therapeutic session, ability to establish and benefit from a therapeutic relationship, willingness to work toward established goal(s) with the clinic and ability to engage in reciprocal conversation.  Legal History: Faelyn reported there is no history of legal involvement.   Structured Assessments Results: The Patient Health Questionnaire-9 (PHQ-9) is a self-report measure that assesses symptoms and severity of depression over the course of the last two weeks. Shanicka obtained a score of 16 suggesting moderately severe depression. Vela finds the endorsed symptoms to be very difficult. [0= Not at all; 1= Several days; 2= More than half the days; 3= Nearly every day] Little interest or pleasure in doing things 3  Feeling down, depressed, or hopeless 3  Trouble falling or staying asleep, or sleeping too much 0  Feeling tired or having little energy 3  Poor appetite or overeating 2  Feeling bad about yourself --- or that you are a failure or have let yourself or your family down 0  Trouble concentrating on things, such Sara Patel reading the newspaper or watching television 3  Moving or speaking so slowly that other people could have noticed? Or the opposite --- being so fidgety or restless that you have been moving around a lot more than usual 2  Thoughts that you would be better off dead or hurting yourself in some way 0  PHQ-9 Score 16    The Generalized Anxiety Disorder-7 (GAD-7) is a brief self-report measure that assesses symptoms of anxiety over the course of the last two weeks. Smith obtained a score of 16 suggesting severe anxiety. Mikeila finds the endorsed symptoms to be very difficult. [0= Not at all; 1= Several days; 2= Over half the days; 3= Nearly every day] Feeling nervous, anxious, on edge 3  Not being able to stop or control worrying 3  Worrying too much about different  things 3  Trouble relaxing 2  Being so restless that it's hard to sit still 2  Becoming easily annoyed or irritable 3  Feeling afraid Sara Patel if something awful might happen 0  GAD-7 Score 16   Interventions:  Conducted a chart review Focused on rapport building Verbally administered PHQ-9 and GAD-7 for symptom monitoring Verbally administered Food & Mood questionnaire to assess various behaviors related to emotional eating. Provided emphatic reflections and validation Collaborated with patient on a treatment goal  Psychoeducation provided regarding physical versus emotional hunger Conducted a risk assessment Developed a coping card  Provisional DSM-5 Diagnosis: 311 (F32.8) Other Specified Depressive Disorder, Emotional Eating Behaviors and 300.09 (F41.8) Other Specified Anxiety Disorder, Limited-symptom attacks   Plan: Toni appears able and willing to participate Sara Patel evidenced by collaboration on a treatment goal, engagement in reciprocal conversation, and asking questions Sara Patel needed for clarification. The next appointment will be scheduled in 1-2 weeks, which will be via Webex. The following treatment goal was established: decrease emotional eating. This provider will regularly review the treatment plan and medical chart to keep informed of status changes. Alayza expressed understanding and agreement with the initial treatment plan of care. Ruqayya will be sent a handout via e-mail to utilize between now and the next appointment to increase awareness of hunger patterns and subsequent eating. Khara provided verbal consent during today's appointment for this provider to send the handout via e-mail.

## 2020-01-08 LAB — ANEMIA PANEL
Ferritin: 272 ng/mL — ABNORMAL HIGH (ref 15–150)
Folate, Hemolysate: 320 ng/mL
Folate, RBC: 856 ng/mL (ref >498)
Hematocrit: 37.4 % (ref 34.0–46.6)
Iron Saturation: 21 % (ref 15–55)
Iron: 57 ug/dL (ref 27–159)
Retic Ct Pct: 1.4 % (ref 0.6–2.6)
Total Iron Binding Capacity: 267 ug/dL (ref 250–450)
UIBC: 210 ug/dL (ref 131–425)
Vitamin B-12: 508 pg/mL (ref 232–1245)

## 2020-01-08 LAB — COMPREHENSIVE METABOLIC PANEL
ALT: 29 IU/L (ref 0–32)
AST: 30 IU/L (ref 0–40)
Albumin/Globulin Ratio: 1.1 — ABNORMAL LOW (ref 1.2–2.2)
Albumin: 4.1 g/dL (ref 3.8–4.8)
Alkaline Phosphatase: 108 IU/L (ref 39–117)
BUN/Creatinine Ratio: 10 (ref 9–23)
BUN: 10 mg/dL (ref 6–20)
Bilirubin Total: 0.2 mg/dL (ref 0.0–1.2)
CO2: 21 mmol/L (ref 20–29)
Calcium: 9.2 mg/dL (ref 8.7–10.2)
Chloride: 107 mmol/L — ABNORMAL HIGH (ref 96–106)
Creatinine, Ser: 0.98 mg/dL (ref 0.57–1.00)
GFR calc Af Amer: 86 mL/min/{1.73_m2} (ref >59)
GFR calc non Af Amer: 75 mL/min/{1.73_m2} (ref >59)
Globulin, Total: 3.6 g/dL (ref 1.5–4.5)
Glucose: 84 mg/dL (ref 65–99)
Potassium: 4.2 mmol/L (ref 3.5–5.2)
Sodium: 140 mmol/L (ref 134–144)
Total Protein: 7.7 g/dL (ref 6.0–8.5)

## 2020-01-08 LAB — CBC WITH DIFFERENTIAL/PLATELET
Basophils Absolute: 0 10*3/uL (ref 0.0–0.2)
Basos: 1 %
EOS (ABSOLUTE): 0.2 10*3/uL (ref 0.0–0.4)
Eos: 3 %
Hemoglobin: 12.4 g/dL (ref 11.1–15.9)
Immature Grans (Abs): 0 10*3/uL (ref 0.0–0.1)
Immature Granulocytes: 0 %
Lymphocytes Absolute: 2.5 10*3/uL (ref 0.7–3.1)
Lymphs: 39 %
MCH: 28.4 pg (ref 26.6–33.0)
MCHC: 33.2 g/dL (ref 31.5–35.7)
MCV: 86 fL (ref 79–97)
Monocytes Absolute: 0.5 10*3/uL (ref 0.1–0.9)
Monocytes: 8 %
Neutrophils Absolute: 3.1 10*3/uL (ref 1.4–7.0)
Neutrophils: 49 %
Platelets: 238 10*3/uL (ref 150–450)
RBC: 4.36 x10E6/uL (ref 3.77–5.28)
RDW: 13.6 % (ref 11.7–15.4)
WBC: 6.3 10*3/uL (ref 3.4–10.8)

## 2020-01-08 LAB — T4, FREE: Free T4: 1 ng/dL (ref 0.82–1.77)

## 2020-01-08 LAB — LIPID PANEL WITH LDL/HDL RATIO
Cholesterol, Total: 140 mg/dL (ref 100–199)
HDL: 66 mg/dL (ref >39)
LDL Chol Calc (NIH): 57 mg/dL (ref 0–99)
LDL/HDL Ratio: 0.9 ratio (ref 0.0–3.2)
Triglycerides: 90 mg/dL (ref 0–149)
VLDL Cholesterol Cal: 17 mg/dL (ref 5–40)

## 2020-01-08 LAB — TSH: TSH: 0.588 u[IU]/mL (ref 0.450–4.500)

## 2020-01-08 LAB — INSULIN, RANDOM: INSULIN: 8.1 u[IU]/mL (ref 2.6–24.9)

## 2020-01-08 LAB — FOLATE: Folate: 10.6 ng/mL (ref >3.0)

## 2020-01-08 LAB — HEMOGLOBIN A1C
Est. average glucose Bld gHb Est-mCnc: 111 mg/dL
Hgb A1c MFr Bld: 5.5 % (ref 4.8–5.6)

## 2020-01-08 LAB — VITAMIN D 25 HYDROXY (VIT D DEFICIENCY, FRACTURES): Vit D, 25-Hydroxy: 24.5 ng/mL — ABNORMAL LOW (ref 30.0–100.0)

## 2020-01-08 LAB — MAGNESIUM: Magnesium: 2.1 mg/dL (ref 1.6–2.3)

## 2020-01-08 LAB — T3: T3, Total: 107 ng/dL (ref 71–180)

## 2020-01-14 ENCOUNTER — Ambulatory Visit (INDEPENDENT_AMBULATORY_CARE_PROVIDER_SITE_OTHER): Payer: 59 | Admitting: Psychology

## 2020-01-14 ENCOUNTER — Other Ambulatory Visit: Payer: Self-pay

## 2020-01-14 DIAGNOSIS — F418 Other specified anxiety disorders: Secondary | ICD-10-CM

## 2020-01-14 DIAGNOSIS — F3289 Other specified depressive episodes: Secondary | ICD-10-CM | POA: Diagnosis not present

## 2020-01-14 NOTE — Progress Notes (Signed)
Office: [phone]  /  Fax: [phone]    Date: January 23, 2020   Appointment Start Time: 2:39pm Duration: 20 minutes Provider: Glennie Isle, Psy.D. Type of Session: Individual Therapy  Location of Patient: Home Location of Provider: Provider's Home Type of Contact: Telepsychological Visit via [employer] WebEx  Session Content: This provider called Brittne at 2:32pm as she did not present for the WebEx appointment. A HIPAA compliant voicemail was left requesting a call back. This provider was informed at 2:33pm by front desk staff that [place] called the clinic. This provider called her back and assistance to connect was provided. As such, today's appointment was initiated 9 minutes late. Sara Patel is a 34 y.o. female presenting via Sara Patel for a follow-up appointment to address the previously established treatment goal of decreasing emotional eating. Today's appointment was a telepsychological visit due to COVID-19. Sara Patel provided verbal consent for today's telepsychological appointment and she is aware she is responsible for securing confidentiality on her end of the session. Prior to proceeding with today's appointment, Sara Patel's physical location at the time of this appointment was obtained as well a phone number she could be reached at in the event of technical difficulties. Sara Patel and this provider participated in today's telepsychological service.   This provider conducted a brief check-in. Sara Patel shared about recent events. Regarding eating, she noted, "Eating is going good." She shared about recent weight loss, adding she eats for comfort and stress. Emotional and physical hunger were reviewed. Psychoeducation regarding triggers for emotional eating was provided. Sara Patel was provided a handout, and encouraged to utilize the handout between now and the next appointment to increase awareness of triggers and frequency. Sara Patel agreed. This provider also discussed  behavioral strategies for specific triggers, such as placing the utensil down when conversing to avoid mindless eating. Darlen provided verbal consent during today's appointment for this provider to send a handout about triggers via e-mail. Sara Patel was receptive to today's appointment as evidenced by openness to sharing, responsiveness to feedback, and willingness to explore triggers for emotional eating.  Mental Status Examination:  Appearance: well groomed and appropriate hygiene  Behavior: appropriate to circumstances Mood: euthymic Affect: mood congruent Speech: normal in rate, volume, and tone Eye Contact: appropriate Psychomotor Activity: appropriate Gait: unable to assess Thought Process: linear, logical, and goal directed  Thought Content/Perception: denies suicidal and homicidal ideation, plan, and intent and no hallucinations, delusions, bizarre thinking or behavior reported or observed Orientation: time, person, place and purpose of appointment Memory/Concentration: memory, attention, language, and fund of knowledge intact  Insight/Judgment: good  Interventions:  Conducted a brief chart review Conducted a risk assessment Provided empathic reflections and validation Reviewed content from the previous session Employed supportive psychotherapy interventions to facilitate reduced distress and to improve coping skills with identified stressors Employed motivational interviewing skills to assess patient's willingness/desire to adhere to recommended medical treatments and assignments Psychoeducation provided regarding triggers for emotional eating  DSM-5 Diagnosis(es): 311 (F32.8) Other Specified Depressive Disorder, Emotional Eating Behaviors and 300.09 (F41.8) Other Specified Anxiety Disorder, Limited-symptom attacks  Treatment Goal & Progress: During the initial appointment with this provider, the following treatment goal was established: decrease emotional eating. Sara Patel  has demonstrated progress in her goal as evidenced by increased awareness of hunger patterns.   Plan: The next appointment will be scheduled in two weeks, which will be via [employer]. The next session will focus on working towards the established treatment goal. In addition, she shared her next appointment with her other therapist is February 13, 2020.    

## 2020-01-21 ENCOUNTER — Ambulatory Visit (INDEPENDENT_AMBULATORY_CARE_PROVIDER_SITE_OTHER): Payer: 59 | Admitting: Family Medicine

## 2020-01-21 ENCOUNTER — Other Ambulatory Visit: Payer: Self-pay

## 2020-01-21 ENCOUNTER — Encounter (INDEPENDENT_AMBULATORY_CARE_PROVIDER_SITE_OTHER): Payer: Self-pay | Admitting: Family Medicine

## 2020-01-21 VITALS — BP 100/69 | HR 72 | Temp 98.1°F | Ht 63.0 in | Wt 180.0 lb

## 2020-01-21 DIAGNOSIS — Z6831 Body mass index (BMI) 31.0-31.9, adult: Secondary | ICD-10-CM

## 2020-01-21 DIAGNOSIS — E669 Obesity, unspecified: Secondary | ICD-10-CM | POA: Diagnosis not present

## 2020-01-21 DIAGNOSIS — E559 Vitamin D deficiency, unspecified: Secondary | ICD-10-CM

## 2020-01-21 MED ORDER — VITAMIN D (ERGOCALCIFEROL) 1.25 MG (50000 UNIT) PO CAPS: 50000.0000 [IU] | ORAL_CAPSULE | ORAL | 0 refills | Status: DC

## 2020-01-21 NOTE — Progress Notes (Signed)
Chief Complaint:   OBESITY Sara Patel is here to discuss her progress with her obesity treatment plan along with follow-up of her obesity related diagnoses. Sara Patel is on the Category 2 Plan and states she is following her eating plan approximately 95% of the time. Sara Patel states she is walking for 20-30 minutes 2 times per week.  Today's visit was #: 2 Starting weight: 186 lbs Starting date: 01/07/2020 Today's weight: 180 lbs Today's date: 01/21/2020 Total lbs lost to date: 6 Total lbs lost since last in-office visit: 6  Interim History: Sara Patel did well with weight loss on her Category 2 plan. Her hunger was mostly controlled. She did get bored with her lunch options and noted her plan was more difficult to follow on the weekends. She is getting good support from her husband.  Subjective:   1. Vitamin D deficiency [name] has a new diagnosis of Vit D deficiency. She is not on Vit D and she notes fatigue. Her Vit D level is now lower than ideal. I discussed labs with the patient today.  Assessment/Plan:   1. Vitamin D deficiency Low Vitamin D level contributes to fatigue and are associated with obesity, breast, and colon cancer. Sara Patel agreed to start prescription Vitamin D 50,000 IU every week with no refills. She will follow-up for routine testing of Vitamin D, at least 2-3 times per year to avoid over-replacement. We will recheck labs in 3 months.  - Vitamin D, Ergocalciferol, (DRISDOL) 1.25 MG (50000 UNIT) CAPS capsule; Take 1 capsule (50,000 Units total) by mouth every 7 (seven) days.  Dispense: 4 capsule; Refill: 0  2. Class 1 obesity with serious comorbidity and body mass index (BMI) of 31.0 to 31.9 in adult, unspecified obesity type Sara Patel is currently in the action stage of change. As such, her goal is to continue with weight loss efforts. She has agreed to the Category 2 Plan + 100 calories with lunch options.   Exercise goals: Sara Patel is to continue her  current exercise regimen as is.  Behavioral modification strategies: increasing lean protein intake and meal planning and cooking strategies.  Sara Patel has agreed to follow-up with our clinic in 2 weeks. She was informed of the importance of frequent follow-up visits to maximize her success with intensive lifestyle modifications for her multiple health conditions.   Objective:   Blood pressure 100/69, pulse 72, temperature 98.1 F (36.7 C), temperature source Oral, height 5' 3"  (1.6 m), weight 180 lb (81.6 kg), last menstrual period 01/13/2020, SpO2 98 %, unknown if currently breastfeeding. Body mass index is 31.89 kg/m.  General: Cooperative, alert, well developed, in no acute distress. HEENT: Conjunctivae and lids unremarkable. Cardiovascular: Regular rhythm.  Lungs: Normal work of breathing. Neurologic: No focal deficits.   Lab Results  Component Value Date   CREATININE 0.98 01/07/2020   BUN 10 01/07/2020   NA 140 01/07/2020   K 4.2 01/07/2020   CL 107 (H) 01/07/2020   CO2 21 01/07/2020   Lab Results  Component Value Date   ALT 29 01/07/2020   AST 30 01/07/2020   ALKPHOS 108 01/07/2020   BILITOT 0.2 01/07/2020   Lab Results  Component Value Date   HGBA1C 5.5 01/07/2020   HGBA1C 5.7 01/18/2016   Lab Results  Component Value Date   INSULIN 8.1 01/07/2020   Lab Results  Component Value Date   TSH 0.588 01/07/2020   Lab Results  Component Value Date   CHOL 140 01/07/2020   HDL 66 01/07/2020  LDLCALC 57 01/07/2020   TRIG 90 01/07/2020   CHOLHDL 2.5 03/08/2013   Lab Results  Component Value Date   WBC 6.3 01/07/2020   HGB 12.4 01/07/2020   HCT 37.4 01/07/2020   MCV 86 01/07/2020   PLT 238 01/07/2020   Lab Results  Component Value Date   IRON 57 01/07/2020   TIBC 267 01/07/2020   FERRITIN 272 (H) 01/07/2020   Attestation Statements:   Reviewed by clinician on day of visit: allergies, medications, problem list, medical history, surgical history,  family history, social history, and previous encounter notes.  Time spent on visit including pre-visit chart review and post-visit care and charting was 30 minutes.    I, Trixie Dredge, am acting as transcriptionist for Dennard Nip, MD.  I have reviewed the above documentation for accuracy and completeness, and I agree with the above. -  Dennard Nip, MD

## 2020-01-23 ENCOUNTER — Other Ambulatory Visit: Payer: Self-pay

## 2020-01-23 ENCOUNTER — Ambulatory Visit (INDEPENDENT_AMBULATORY_CARE_PROVIDER_SITE_OTHER): Payer: 59 | Admitting: Psychology

## 2020-01-23 DIAGNOSIS — F3289 Other specified depressive episodes: Secondary | ICD-10-CM | POA: Diagnosis not present

## 2020-01-23 DIAGNOSIS — F418 Other specified anxiety disorders: Secondary | ICD-10-CM

## 2020-01-23 NOTE — Progress Notes (Unsigned)
Office: [phone]  /  Fax: [phone]    Date: February 06, 2020   Appointment Start Time: *** Duration: *** minutes Provider: Glennie Isle, Psy.D. Type of Session: Individual Therapy  Location of Patient: {gbptloc:23249} Location of Provider: {Location of Service:22491} Type of Contact: Telepsychological Visit via {gbtelepsych:23399}  Session Content: Sara Patel is a 34 y.o. female presenting via {gbtelepsych:23399} for a follow-up appointment to address the previously established treatment goal of decreasing emotional eating. Today's appointment was a telepsychological visit due to COVID-19. Sara Patel provided verbal consent for today's telepsychological appointment and she is aware she is responsible for securing confidentiality on her end of the session. Prior to proceeding with today's appointment, Sara Patel's physical location at the time of this appointment was obtained as well a phone number she could be reached at in the event of technical difficulties. Sara Patel and this provider participated in today's telepsychological service.   This provider conducted a brief check-in and verbally administered the PHQ-9 and GAD-7. *** Sara Patel was receptive to today's appointment as evidenced by openness to sharing, responsiveness to feedback, and {gbreceptiveness:23401}.  Mental Status Examination:  Appearance: {Appearance:22431} Behavior: {Behavior:22445} Mood: {gbmood:21757} Affect: {Affect:22436} Speech: {Speech:22432} Eye Contact: {Eye Contact:22433} Psychomotor Activity: {Motor Activity:22434} Gait: {gbgait:23404} Thought Process: {thought process:22448}  Thought Content/Perception: {disturbances:22451} Orientation: {Orientation:22437} Memory/Concentration: {gbcognition:22449} Insight/Judgment: {Insight:22446}  Structured Assessments Results: The Patient Health Questionnaire-9 (PHQ-9) is a self-report measure that assesses symptoms and severity of depression over the course of  the last two weeks. Sara Patel obtained a score of *** suggesting {GBPHQ9SEVERITY:21752}. Sara Patel finds the endorsed symptoms to be {gbphq9difficulty:21754}. [0= Not at all; 1= Several days; 2= More than half the days; 3= Nearly every day] Little interest or pleasure in doing things ***  Feeling down, depressed, or hopeless ***  Trouble falling or staying asleep, or sleeping too much ***  Feeling tired or having little energy ***  Poor appetite or overeating ***  Feeling bad about yourself --- or that you are a failure or have let yourself or your family down ***  Trouble concentrating on things, such as reading the newspaper or watching television ***  Moving or speaking so slowly that other people could have noticed? Or the opposite --- being so fidgety or restless that you have been moving around a lot more than usual ***  Thoughts that you would be better off dead or hurting yourself in some way ***  PHQ-9 Score ***    The Generalized Anxiety Disorder-7 (GAD-7) is a brief self-report measure that assesses symptoms of anxiety over the course of the last two weeks. Sara Patel obtained a score of *** suggesting {gbgad7severity:21753}. Sara Patel finds the endorsed symptoms to be {gbphq9difficulty:21754}. [0= Not at all; 1= Several days; 2= Over half the days; 3= Nearly every day] Feeling nervous, anxious, on edge ***  Not being able to stop or control worrying ***  Worrying too much about different things ***  Trouble relaxing ***  Being so restless that it's hard to sit still ***  Becoming easily annoyed or irritable ***  Feeling afraid as if something awful might happen ***  GAD-7 Score ***   Interventions:  {Interventions for Progress Notes:23405}  DSM-5 Diagnosis(es): 311 (F32.8) Other Specified Depressive Disorder, Emotional Eating Behaviors and 300.09 (F41.8) Other Specified Anxiety Disorder, Limited-symptom attacks  Treatment Goal & Progress: During the initial appointment with  this provider, the following treatment goal was established: decrease emotional eating. Sara Patel has demonstrated progress in her goal as evidenced by {gbtxprogress:22839}. Sara Patel also {gbtxprogress2:22951}.  Plan: The  next appointment will be scheduled in {gbweeks:21758}, which will be {gbtxmodality:23402}. The next session will focus on {Plan for Next Appointment:23400}.

## 2020-02-04 ENCOUNTER — Encounter (INDEPENDENT_AMBULATORY_CARE_PROVIDER_SITE_OTHER): Payer: Self-pay | Admitting: Family Medicine

## 2020-02-04 ENCOUNTER — Ambulatory Visit (INDEPENDENT_AMBULATORY_CARE_PROVIDER_SITE_OTHER): Payer: 59 | Admitting: Family Medicine

## 2020-02-04 ENCOUNTER — Other Ambulatory Visit: Payer: Self-pay

## 2020-02-04 VITALS — BP 110/71 | HR 77 | Temp 98.6°F | Ht 63.0 in | Wt 179.0 lb

## 2020-02-04 DIAGNOSIS — Z6831 Body mass index (BMI) 31.0-31.9, adult: Secondary | ICD-10-CM

## 2020-02-04 DIAGNOSIS — E669 Obesity, unspecified: Secondary | ICD-10-CM | POA: Diagnosis not present

## 2020-02-04 DIAGNOSIS — E8881 Metabolic syndrome: Secondary | ICD-10-CM | POA: Diagnosis not present

## 2020-02-05 ENCOUNTER — Encounter (INDEPENDENT_AMBULATORY_CARE_PROVIDER_SITE_OTHER): Payer: Self-pay | Admitting: Family Medicine

## 2020-02-05 DIAGNOSIS — Z6832 Body mass index (BMI) 32.0-32.9, adult: Secondary | ICD-10-CM | POA: Insufficient documentation

## 2020-02-05 DIAGNOSIS — E8881 Metabolic syndrome: Secondary | ICD-10-CM | POA: Insufficient documentation

## 2020-02-05 DIAGNOSIS — E88819 Insulin resistance, unspecified: Secondary | ICD-10-CM | POA: Insufficient documentation

## 2020-02-05 DIAGNOSIS — E669 Obesity, unspecified: Secondary | ICD-10-CM | POA: Insufficient documentation

## 2020-02-05 NOTE — Progress Notes (Signed)
Chief Complaint:   OBESITY Sara Patel is here to discuss her progress with her obesity treatment plan along with follow-up of her obesity related diagnoses. Sara Patel is on the Category 2 Plan and states she is following her eating plan approximately 90% of the time. Sara Patel states she is walking 30-45 minutes 2 times per week.  Today's visit was #: 3 Starting weight: 186 lbs Starting date: 01/07/2020 Today's weight: 179 lbs Today's date: 02/04/2020 Total lbs lost to date: 7 Total lbs lost since last in-office visit: 1  Interim History: Sara Patel likes the food on the plan. She reports changing habits such as reducing oils and fats in cooking. She denies hunger and is not skipping meals. She has improved adherance to the plan on the weekends. She is cooking with her air fryer.  Subjective:   Insulin resistance. Sara Patel has a diagnosis of insulin resistance based on her elevated fasting insulin level >5. She continues to work on diet and exercise to decrease her risk of diabetes. No polyphagia.  Lab Results  Component Value Date   INSULIN 8.1 01/07/2020   Lab Results  Component Value Date   HGBA1C 5.5 01/07/2020   Assessment/Plan:   Insulin resistance. Sara Patel will continue her meal plan, continue to work on weight loss, exercise, and decreasing simple carbohydrates to help decrease the risk of diabetes. Sara Patel agreed to follow-up with [place] as directed to closely monitor her progress.  Class 1 obesity with serious comorbidity and body mass index (BMI) of 31.0 to 31.9 in adult, unspecified obesity type.  Sara Patel is currently in the action stage of change. As such, her goal is to continue with weight loss efforts. She has agreed to the Category 2 Plan + 100 calories.   We discussed options for low calorie coffee.  Exercise goals: Sara Patel will continue her current exercise regimen.  Behavioral modification strategies: better snacking choices, celebration  eating strategies and planning for success.  Sara Patel has agreed to follow-up with our clinic in 2 weeks. She was informed of the importance of frequent follow-up visits to maximize her success with intensive lifestyle modifications for her multiple health conditions.   Objective:   Blood pressure 110/71, pulse 77, temperature 98.6 F (37 C), temperature source Oral, height 5\' 3"  (1.6 m), weight 179 lb (81.2 kg), last menstrual period 01/13/2020, SpO2 100 %, not currently breastfeeding. Body mass index is 31.71 kg/m.  General: Cooperative, alert, well developed, in no acute distress. HEENT: Conjunctivae and lids unremarkable. Cardiovascular: Regular rhythm.  Lungs: Normal work of breathing. Neurologic: No focal deficits.   Lab Results  Component Value Date   CREATININE 0.98 01/07/2020   BUN 10 01/07/2020   NA 140 01/07/2020   K 4.2 01/07/2020   CL 107 (H) 01/07/2020   CO2 21 01/07/2020   Lab Results  Component Value Date   ALT 29 01/07/2020   AST 30 01/07/2020   ALKPHOS 108 01/07/2020   BILITOT 0.2 01/07/2020   Lab Results  Component Value Date   HGBA1C 5.5 01/07/2020   HGBA1C 5.7 01/18/2016   Lab Results  Component Value Date   INSULIN 8.1 01/07/2020   Lab Results  Component Value Date   TSH 0.588 01/07/2020   Lab Results  Component Value Date   CHOL 140 01/07/2020   HDL 66 01/07/2020   LDLCALC 57 01/07/2020   TRIG 90 01/07/2020   CHOLHDL 2.5 03/08/2013   Lab Results  Component Value Date   WBC 6.3 01/07/2020  HGB 12.4 01/07/2020   HCT 37.4 01/07/2020   MCV 86 01/07/2020   PLT 238 01/07/2020   Lab Results  Component Value Date   IRON 57 01/07/2020   TIBC 267 01/07/2020   FERRITIN 272 (H) 01/07/2020   Attestation Statements:   Reviewed by clinician on day of visit: allergies, medications, problem list, medical history, surgical history, family history, social history, and previous encounter notes.  IMichaelene Song, am acting as Location manager  for [employer], FNP   I have reviewed the above documentation for accuracy and completeness, and I agree with the above. -  Georgianne Fick, FNP

## 2020-02-06 ENCOUNTER — Ambulatory Visit (INDEPENDENT_AMBULATORY_CARE_PROVIDER_SITE_OTHER): Payer: 59 | Admitting: Psychology

## 2020-02-11 ENCOUNTER — Other Ambulatory Visit (INDEPENDENT_AMBULATORY_CARE_PROVIDER_SITE_OTHER): Payer: Self-pay | Admitting: Family Medicine

## 2020-02-11 DIAGNOSIS — E559 Vitamin D deficiency, unspecified: Secondary | ICD-10-CM

## 2020-02-18 ENCOUNTER — Encounter (INDEPENDENT_AMBULATORY_CARE_PROVIDER_SITE_OTHER): Payer: Self-pay | Admitting: Bariatrics

## 2020-02-18 ENCOUNTER — Ambulatory Visit (INDEPENDENT_AMBULATORY_CARE_PROVIDER_SITE_OTHER): Payer: 59 | Admitting: Bariatrics

## 2020-02-18 ENCOUNTER — Other Ambulatory Visit: Payer: Self-pay

## 2020-02-18 VITALS — BP 105/68 | HR 64 | Temp 98.1°F | Ht 63.0 in | Wt 174.0 lb

## 2020-02-18 DIAGNOSIS — F3289 Other specified depressive episodes: Secondary | ICD-10-CM | POA: Diagnosis not present

## 2020-02-18 DIAGNOSIS — E559 Vitamin D deficiency, unspecified: Secondary | ICD-10-CM | POA: Diagnosis not present

## 2020-02-18 DIAGNOSIS — E669 Obesity, unspecified: Secondary | ICD-10-CM | POA: Diagnosis not present

## 2020-02-18 DIAGNOSIS — Z6831 Body mass index (BMI) 31.0-31.9, adult: Secondary | ICD-10-CM

## 2020-02-18 DIAGNOSIS — Z9189 Other specified personal risk factors, not elsewhere classified: Secondary | ICD-10-CM | POA: Diagnosis not present

## 2020-02-18 MED ORDER — VITAMIN D (ERGOCALCIFEROL) 1.25 MG (50000 UNIT) PO CAPS: 50000.0000 [IU] | ORAL_CAPSULE | ORAL | 0 refills | Status: DC

## 2020-02-18 NOTE — Progress Notes (Signed)
Chief Complaint:   OBESITY Sara Patel is here to discuss her progress with her obesity treatment plan along with follow-up of her obesity related diagnoses. Sara Patel is on the Category 2 Plan and states she is following her eating plan approximately 90% of the time. Sara Patel states she is walking 45 minutes 2 times per week.  Today's visit was #: 4 Starting weight: 186 lbs Starting date: 01/07/2020 Today's weight: 174 lbs Today's date: 02/18/2020 Total lbs lost to date: 12 Total lbs lost since last in-office visit: 5  Interim History: Sara Patel is down 5 lbs and doing well overall. She is trying to stay consistent. She reports doing well with her water and protein intake.  Subjective:   Vitamin D deficiency. No nausea, vomiting, or muscle weakness. Last Vitamin D 24.5 on 01/07/2020.  Other Specified Depressive Disorder, Emotional Eating Behaviors. Sara Patel is struggling with emotional eating and using food for comfort to the extent that it is negatively impacting her health. She has been working on behavior modification techniques to help reduce her emotional eating and has been somewhat successful. She shows no sign of suicidal or homicidal ideations. Sara Patel is on no medications.  At risk for osteoporosis. Sara Patel is at higher risk of osteopenia and osteoporosis due to Vitamin D deficiency.   Assessment/Plan:   Vitamin D deficiency. Low Vitamin D level contributes to fatigue and are associated with obesity, breast, and colon cancer. She was given a prescription for Vitamin D, Ergocalciferol, (DRISDOL) 1.25 MG (50000 UNIT) CAPS capsule every week #4 with 0 refills and will follow-up for routine testing of Vitamin D, at least 2-3 times per year to avoid over-replacement.     Other Specified Depressive Disorder, Emotional Eating Behaviors. Behavior modification techniques were discussed today to help Sara Patel deal with her emotional/non-hunger eating behaviors.  Orders  and follow up as documented in patient record. We discussed CBT techniques. She will save her snacks when she really needs it.  At risk for osteoporosis. Sara Patel was given approximately 15 minutes of osteoporosis prevention counseling today. Sara Patel is at risk for osteopenia and osteoporosis due to her Vitamin D deficiency. She was encouraged to take her Vitamin D and follow her higher calcium diet and increase strengthening exercise to help strengthen her bones and decrease her risk of osteopenia and osteoporosis.  Repetitive spaced learning was employed today to elicit superior memory formation and behavioral change.  Class 1 obesity with serious comorbidity and body mass index (BMI) of 31.0 to 31.9 in adult, unspecified obesity type.  Sara Patel is currently in the action stage of change. As such, her goal is to continue with weight loss efforts. She has agreed to the Category 2 Plan.   She will work on meal planning and mindful eating.  Exercise goals: Sara Patel will continue walking 45 minutes 2 times a week and will continue strength training.  Behavioral modification strategies: increasing lean protein intake, decreasing simple carbohydrates, increasing vegetables, increasing water intake, decreasing eating out, no skipping meals, meal planning and cooking strategies, keeping healthy foods in the home and planning for success.  Sara Patel has agreed to follow-up with our clinic in 2 weeks. She was informed of the importance of frequent follow-up visits to maximize her success with intensive lifestyle modifications for her multiple health conditions.    Objective:   Blood pressure 105/68, pulse 64, temperature 98.1 F (36.7 C), height 5' 3"  (1.6 m), weight 174 lb (78.9 kg), last menstrual period 02/15/2020, SpO2 100 %, not currently breastfeeding.  Body mass index is 30.82 kg/m.  General: Cooperative, alert, well developed, in no acute distress. HEENT: Conjunctivae and lids  unremarkable. Cardiovascular: Regular rhythm.  Lungs: Normal work of breathing. Neurologic: No focal deficits.   Lab Results  Component Value Date   CREATININE 0.98 01/07/2020   BUN 10 01/07/2020   NA 140 01/07/2020   K 4.2 01/07/2020   CL 107 (H) 01/07/2020   CO2 21 01/07/2020   Lab Results  Component Value Date   ALT 29 01/07/2020   AST 30 01/07/2020   ALKPHOS 108 01/07/2020   BILITOT 0.2 01/07/2020   Lab Results  Component Value Date   HGBA1C 5.5 01/07/2020   HGBA1C 5.7 01/18/2016   Lab Results  Component Value Date   INSULIN 8.1 01/07/2020   Lab Results  Component Value Date   TSH 0.588 01/07/2020   Lab Results  Component Value Date   CHOL 140 01/07/2020   HDL 66 01/07/2020   LDLCALC 57 01/07/2020   TRIG 90 01/07/2020   CHOLHDL 2.5 03/08/2013   Lab Results  Component Value Date   WBC 6.3 01/07/2020   HGB 12.4 01/07/2020   HCT 37.4 01/07/2020   MCV 86 01/07/2020   PLT 238 01/07/2020   Lab Results  Component Value Date   IRON 57 01/07/2020   TIBC 267 01/07/2020   FERRITIN 272 (H) 01/07/2020   Attestation Statements:   Reviewed by clinician on day of visit: allergies, medications, problem list, medical history, surgical history, family history, social history, and previous encounter notes.  Migdalia Dk, am acting as Location manager for [employer], DO   I have reviewed the above documentation for accuracy and completeness, and I agree with the above. Jearld Lesch, DO

## 2020-03-03 ENCOUNTER — Encounter (INDEPENDENT_AMBULATORY_CARE_PROVIDER_SITE_OTHER): Payer: Self-pay | Admitting: Family Medicine

## 2020-03-03 ENCOUNTER — Other Ambulatory Visit: Payer: Self-pay

## 2020-03-03 ENCOUNTER — Ambulatory Visit (INDEPENDENT_AMBULATORY_CARE_PROVIDER_SITE_OTHER): Payer: 59 | Admitting: Family Medicine

## 2020-03-03 VITALS — BP 103/65 | HR 62 | Temp 98.2°F | Ht 63.0 in | Wt 174.0 lb

## 2020-03-03 DIAGNOSIS — Z9189 Other specified personal risk factors, not elsewhere classified: Secondary | ICD-10-CM | POA: Diagnosis not present

## 2020-03-03 DIAGNOSIS — Z6830 Body mass index (BMI) 30.0-30.9, adult: Secondary | ICD-10-CM

## 2020-03-03 DIAGNOSIS — E8881 Metabolic syndrome: Secondary | ICD-10-CM

## 2020-03-03 DIAGNOSIS — E669 Obesity, unspecified: Secondary | ICD-10-CM

## 2020-03-03 DIAGNOSIS — E559 Vitamin D deficiency, unspecified: Secondary | ICD-10-CM | POA: Diagnosis not present

## 2020-03-03 MED ORDER — METFORMIN HCL 500 MG PO TABS: 500.0000 mg | ORAL_TABLET | Freq: Every day | ORAL | 0 refills | Status: DC

## 2020-03-03 NOTE — Progress Notes (Signed)
Chief Complaint:   OBESITY Sara Patel is here to discuss her progress with her obesity treatment plan along with follow-up of her obesity related diagnoses. [name] is on the Category 2 Plan and states she is following her eating plan approximately 90% of the time. [name] states she is walking for 45 minutes 3 times per week.  Today's visit was #: 5 Starting weight: 186 lbs Starting date: 01/07/2020 Today's weight: 174 lbs Today's date: 03/03/2020 Total lbs lost to date: 12 Total lbs lost since last in-office visit: 0  Interim History: Sara Patel's husband started coming to our clinic about 1 month ago and is on Category 2 also. She has adhered to the plan very well over the last few weeks.  Subjective:   1. Insulin resistance Sara Patel has mild insulin resistance. She notes polyphagia in the morning after breakfast.  2. Vitamin D deficiency Sara Patel is on prescription Vit D. Her last Vit D level was low at 20.3.  3. At risk for diabetes mellitus Sara Patel is at higher than average risk for developing diabetes due to her obesity.   Assessment/Plan:   1. Insulin resistance Sara Patel will continue to work on weight loss, exercise, and decreasing simple carbohydrates to help decrease the risk of diabetes. Sara Patel agreed to start metformin 500 mg daily with no refills. Sara Patel agreed to follow-up with [place] as directed to closely monitor her progress.  - metFORMIN (GLUCOPHAGE) 500 MG tablet; Take 1 tablet (500 mg total) by mouth daily with breakfast.  Dispense: 30 tablet; Refill: 0  2. Vitamin D deficiency Low Vitamin D level contributes to fatigue and are associated with obesity, breast, and colon cancer. Rachna agreed to continue taking prescription Vitamin D 50,000 IU every week and will follow-up for routine testing of Vitamin D, at least 2-3 times per year to avoid over-replacement.  3. At risk for diabetes mellitus Sara Patel was given approximately 15 minutes of  diabetes education and counseling today. We discussed intensive lifestyle modifications today with an emphasis on weight loss as well as increasing exercise and decreasing simple carbohydrates in her diet. We discussed insulin resistance in depth and it's possible progression to type 2 diabetes.   Repetitive spaced learning was employed today to elicit superior memory formation and behavioral change.  4. Class 1 obesity with serious comorbidity and body mass index (BMI) of 30.0 to 30.9 in adult, unspecified obesity type Sara Patel is currently in the action stage of change. As such, her goal is to continue with weight loss efforts. She has agreed to the Category 2 Plan.   Handout given today: Breakfast and Lunch options.  Exercise goals: As is.  Behavioral modification strategies: meal planning and cooking strategies and planning for success.  Sara Patel has agreed to follow-up with our clinic in 2 weeks. She was informed of the importance of frequent follow-up visits to maximize her success with intensive lifestyle modifications for her multiple health conditions.   Objective:   Blood pressure 103/65, pulse 62, temperature 98.2 F (36.8 C), temperature source Oral, height 5' 3"  (1.6 m), weight 174 lb (78.9 kg), last menstrual period 02/15/2020, SpO2 100 %, not currently breastfeeding. Body mass index is 30.82 kg/m.  General: Cooperative, alert, well developed, in no acute distress. HEENT: Conjunctivae and lids unremarkable. Cardiovascular: Regular rhythm.  Lungs: Normal work of breathing. Neurologic: No focal deficits.   Lab Results  Component Value Date   CREATININE 0.98 01/07/2020   BUN 10 01/07/2020   NA 140 01/07/2020   K 4.2  01/07/2020   CL 107 (H) 01/07/2020   CO2 21 01/07/2020   Lab Results  Component Value Date   ALT 29 01/07/2020   AST 30 01/07/2020   ALKPHOS 108 01/07/2020   BILITOT 0.2 01/07/2020   Lab Results  Component Value Date   HGBA1C 5.5 01/07/2020    HGBA1C 5.7 01/18/2016   Lab Results  Component Value Date   INSULIN 8.1 01/07/2020   Lab Results  Component Value Date   TSH 0.588 01/07/2020   Lab Results  Component Value Date   CHOL 140 01/07/2020   HDL 66 01/07/2020   LDLCALC 57 01/07/2020   TRIG 90 01/07/2020   CHOLHDL 2.5 03/08/2013   Lab Results  Component Value Date   WBC 6.3 01/07/2020   HGB 12.4 01/07/2020   HCT 37.4 01/07/2020   MCV 86 01/07/2020   PLT 238 01/07/2020   Lab Results  Component Value Date   IRON 57 01/07/2020   TIBC 267 01/07/2020   FERRITIN 272 (H) 01/07/2020   Attestation Statements:   Reviewed by clinician on day of visit: allergies, medications, problem list, medical history, surgical history, family history, social history, and previous encounter notes.   Wilhemena Durie, am acting as Location manager for [employer], FNP-C.  I have reviewed the above documentation for accuracy and completeness, and I agree with the above. -  Georgianne Fick, FNP

## 2020-03-04 ENCOUNTER — Encounter (INDEPENDENT_AMBULATORY_CARE_PROVIDER_SITE_OTHER): Payer: Self-pay | Admitting: Family Medicine

## 2020-03-04 DIAGNOSIS — E559 Vitamin D deficiency, unspecified: Secondary | ICD-10-CM | POA: Insufficient documentation

## 2020-03-25 ENCOUNTER — Other Ambulatory Visit: Payer: Self-pay

## 2020-03-25 ENCOUNTER — Encounter (INDEPENDENT_AMBULATORY_CARE_PROVIDER_SITE_OTHER): Payer: Self-pay | Admitting: Family Medicine

## 2020-03-25 ENCOUNTER — Ambulatory Visit (INDEPENDENT_AMBULATORY_CARE_PROVIDER_SITE_OTHER): Payer: 59 | Admitting: Family Medicine

## 2020-03-25 VITALS — BP 96/63 | HR 76 | Temp 97.6°F | Ht 63.0 in | Wt 172.0 lb

## 2020-03-25 DIAGNOSIS — E8881 Metabolic syndrome: Secondary | ICD-10-CM

## 2020-03-25 DIAGNOSIS — E669 Obesity, unspecified: Secondary | ICD-10-CM

## 2020-03-25 DIAGNOSIS — Z9189 Other specified personal risk factors, not elsewhere classified: Secondary | ICD-10-CM | POA: Diagnosis not present

## 2020-03-25 DIAGNOSIS — E559 Vitamin D deficiency, unspecified: Secondary | ICD-10-CM | POA: Diagnosis not present

## 2020-03-25 DIAGNOSIS — Z6830 Body mass index (BMI) 30.0-30.9, adult: Secondary | ICD-10-CM

## 2020-03-25 MED ORDER — VITAMIN D (ERGOCALCIFEROL) 1.25 MG (50000 UNIT) PO CAPS: 50000.0000 [IU] | ORAL_CAPSULE | ORAL | 0 refills | Status: DC

## 2020-03-25 MED ORDER — METFORMIN HCL 500 MG PO TABS: 500.0000 mg | ORAL_TABLET | Freq: Every day | ORAL | 0 refills | Status: DC

## 2020-03-25 NOTE — Progress Notes (Signed)
Chief Complaint:   OBESITY Sara Patel is here to discuss her progress with her obesity treatment plan along with follow-up of her obesity related diagnoses. Sara Patel is on the Category 2 Plan and states she is following her eating plan approximately 90% of the time. Sara Patel states she is walking for 30-45 minutes 4 times per week.  Today's visit was #: 6 Starting weight: 186 lbs Starting date: 01/07/2020 Today's weight: 172 lbs Today's date: 03/25/2020 Total lbs lost to date: 14 Total lbs lost since last in-office visit: 2  Interim History: Sara Patel will be going on a 4 day trip May 13th and she is already planning what to eat on her trip. She is frustrated with her slow weight loss, but she is doing very well. She has recently started strength training 2 times per week. Her husband is also a patient at our clinic.  Subjective:   1. Insulin resistance Sara Patel was started on metformin at her last office visit for polyphagia. She still notes hunger in the morning and slight nausea. Lab Results  Component Value Date   HGBA1C 5.5 01/07/2020    2. Vitamin D deficiency Sara Patel's last Vit D level was low at 24.5. She is on high dose Vit D.  3. At risk for diabetes mellitus Sara Patel is at higher than average risk for developing diabetes due to her obesity and insulin resistance.  Assessment/Plan:   1. Insulin resistance Sara Patel will continue to work on weight loss, exercise, and decreasing simple carbohydrates to help decrease the risk of diabetes. We will refill metformin for 1 month. Sara Patel agreed to follow-up with [place] as directed to closely monitor her progress.  - metFORMIN (GLUCOPHAGE) 500 MG tablet; Take 1 tablet (500 mg total) by mouth daily with breakfast.  Dispense: 30 tablet; Refill: 0  2. Vitamin D deficiency Low Vitamin D level contributes to fatigue and are associated with obesity, breast, and colon cancer. We will refill prescription Vitamin D for 1 month.  Sara Patel will follow-up for routine testing of Vitamin D, at least 2-3 times per year to avoid over-replacement.  - Vitamin D, Ergocalciferol, (DRISDOL) 1.25 MG (50000 UNIT) CAPS capsule; Take 1 capsule (50,000 Units total) by mouth every 7 (seven) days.  Dispense: 4 capsule; Refill: 0  3. At risk for diabetes mellitus Sara Patel was given approximately 15 minutes of diabetes education and counseling today. We discussed intensive lifestyle modifications today with an emphasis on weight loss as well as increasing exercise and decreasing simple carbohydrates in her diet. We also reviewed medication options with an emphasis on risk versus benefit of those discussed.   Repetitive spaced learning was employed today to elicit superior memory formation and behavioral change.  4. Class 1 obesity with serious comorbidity and body mass index (BMI) of 30.0 to 30.9 in adult, unspecified obesity type Sara Patel is currently in the action stage of change. As such, her goal is to continue with weight loss efforts. She has agreed to the Category 2 Plan.   Exercise goals: As is.  Behavioral modification strategies: dealing with family or coworker sabotage and travel eating strategies.  Sara Patel has agreed to follow-up with our clinic in 3 weeks. She was informed of the importance of frequent follow-up visits to maximize her success with intensive lifestyle modifications for her multiple health conditions.   Objective:   Blood pressure 96/63, pulse 76, temperature 97.6 F (36.4 C), temperature source Oral, height 5' 3"  (1.6 m), weight 172 lb (78 kg), last menstrual period 03/19/2020,  SpO2 99 %, not currently breastfeeding. Body mass index is 30.47 kg/m.  General: Cooperative, alert, well developed, in no acute distress. HEENT: Conjunctivae and lids unremarkable. Cardiovascular: Regular rhythm.  Lungs: Normal work of breathing. Neurologic: No focal deficits.   Lab Results  Component Value Date    CREATININE 0.98 01/07/2020   BUN 10 01/07/2020   NA 140 01/07/2020   K 4.2 01/07/2020   CL 107 (H) 01/07/2020   CO2 21 01/07/2020   Lab Results  Component Value Date   ALT 29 01/07/2020   AST 30 01/07/2020   ALKPHOS 108 01/07/2020   BILITOT 0.2 01/07/2020   Lab Results  Component Value Date   HGBA1C 5.5 01/07/2020   HGBA1C 5.7 01/18/2016   Lab Results  Component Value Date   INSULIN 8.1 01/07/2020   Lab Results  Component Value Date   TSH 0.588 01/07/2020   Lab Results  Component Value Date   CHOL 140 01/07/2020   HDL 66 01/07/2020   LDLCALC 57 01/07/2020   TRIG 90 01/07/2020   CHOLHDL 2.5 03/08/2013   Lab Results  Component Value Date   WBC 6.3 01/07/2020   HGB 12.4 01/07/2020   HCT 37.4 01/07/2020   MCV 86 01/07/2020   PLT 238 01/07/2020   Lab Results  Component Value Date   IRON 57 01/07/2020   TIBC 267 01/07/2020   FERRITIN 272 (H) 01/07/2020   Attestation Statements:   Reviewed by clinician on day of visit: allergies, medications, problem list, medical history, surgical history, family history, social history, and previous encounter notes.   Wilhemena Durie, am acting as Location manager for [employer], FNP-C.  I have reviewed the above documentation for accuracy and completeness, and I agree with the above. -  Georgianne Fick, FNP

## 2020-04-14 ENCOUNTER — Ambulatory Visit (INDEPENDENT_AMBULATORY_CARE_PROVIDER_SITE_OTHER): Payer: 59 | Admitting: Family Medicine

## 2020-04-14 ENCOUNTER — Other Ambulatory Visit: Payer: Self-pay

## 2020-04-14 ENCOUNTER — Encounter (INDEPENDENT_AMBULATORY_CARE_PROVIDER_SITE_OTHER): Payer: Self-pay | Admitting: Family Medicine

## 2020-04-14 VITALS — BP 102/68 | HR 74 | Temp 98.5°F | Ht 63.0 in | Wt 168.0 lb

## 2020-04-14 DIAGNOSIS — E8881 Metabolic syndrome: Secondary | ICD-10-CM

## 2020-04-14 DIAGNOSIS — E559 Vitamin D deficiency, unspecified: Secondary | ICD-10-CM

## 2020-04-14 DIAGNOSIS — E669 Obesity, unspecified: Secondary | ICD-10-CM | POA: Diagnosis not present

## 2020-04-14 DIAGNOSIS — Z9189 Other specified personal risk factors, not elsewhere classified: Secondary | ICD-10-CM

## 2020-04-14 DIAGNOSIS — Z6830 Body mass index (BMI) 30.0-30.9, adult: Secondary | ICD-10-CM

## 2020-04-14 MED ORDER — VITAMIN D (ERGOCALCIFEROL) 1.25 MG (50000 UNIT) PO CAPS: 50000.0000 [IU] | ORAL_CAPSULE | ORAL | 0 refills | Status: DC

## 2020-04-14 NOTE — Progress Notes (Signed)
Chief Complaint:   OBESITY Sara Patel is here to discuss her progress with her obesity treatment plan along with follow-up of her obesity related diagnoses. Sara Patel is on the Category 2 Plan and states she is following her eating plan approximately 90% of the time. Sara Patel states she is walking for 30 minutes 4 times per week.  Today's visit was #: 7 Starting weight: 186 lbs Starting date: 01/07/2020 Today's weight: 168 lbs Today's date: 04/14/2020 Total lbs lost to date: 18 Total lbs lost since last in-office visit: 4  Interim History: Sara Patel is feeling very good about her weight loss. She does not find it hard to stick to the plan. Her husband is also attending our clinic and they arte a great support to each other.   Subjective:   1. Vitamin D deficiency Sara Patel's last Vit D level was low at 24.5. She is on prescription Vit D.  2. Insulin resistance Sara Patel has a diagnosis of insulin resistance based on her elevated fasting insulin level >5. Sara Patel feels metformin helps with her appetite. 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 She is also being evaluated by dermatology for this. She denies polyphagia. She continues to work on diet and exercise to decrease her risk of diabetes.   Lab Results  Component Value Date   INSULIN 8.1 01/07/2020   Lab Results  Component Value Date   HGBA1C 5.5 01/07/2020   3. At risk for diabetes mellitus Sara Patel is at higher than average risk for developing diabetes due to her obesity and insulin resistance.   Assessment/Plan:   1. Vitamin D deficiency Low Vitamin D level contributes to fatigue and are associated with obesity, breast, and colon cancer. We will refill prescription Vitamin D for 1 month. Sara Patel will follow-up for routine testing of Vitamin D, at least 2-3 times per year to avoid over-replacement.  - Vitamin D, Ergocalciferol, (DRISDOL) 1.25 MG  (50000 UNIT) CAPS capsule; Take 1 capsule (50,000 Units total) by mouth every 7 (seven) days.  Dispense: 4 capsule; Refill: 0  2. Insulin resistance Sara Patel will continue to work on weight loss, exercise, and decreasing simple carbohydrates to help decrease the risk of diabetes. Sara Patel is to stop metformin for now to see if the rash improves. Sara Patel agreed to follow-up with [place] as directed to closely monitor her progress.  3. At risk for diabetes mellitus Sara Patel was given approximately 15 minutes of diabetes education and counseling today. We discussed intensive lifestyle modifications today with an emphasis on weight loss as well as increasing exercise and decreasing simple carbohydrates in her diet. We also reviewed medication options with an emphasis on risk versus benefit of those discussed.   Repetitive spaced learning was employed today to elicit superior memory formation and behavioral change.  4. Class 1 obesity with serious comorbidity and body mass index (BMI) of 30.0 to 30.9 in adult, unspecified obesity type Sara Patel is currently in the action stage of change. As such, her goal is to continue with weight loss efforts. She has agreed to the Category 2 Plan.   Handout given today: Smart Fruit.  Exercise goals: As is.  Behavioral modification strategies: meal planning and cooking strategies, better snacking choices and planning for success.  Sara Patel has agreed to follow-up with our clinic in 3 weeks. She was informed of the importance of frequent follow-up visits to maximize her success with intensive lifestyle modifications for her multiple health conditions.  Objective:   Blood pressure 102/68, pulse 74, temperature 98.5 F (36.9 C), temperature source Oral, height 5' 3"  (1.6 m), weight 168 lb (76.2 kg), last menstrual period 03/19/2020, SpO2 99 %, not currently breastfeeding. Body mass index is 29.76 kg/m.  General: Cooperative, alert, well developed, in no acute  distress. HEENT: Conjunctivae and lids unremarkable. Cardiovascular: Regular rhythm.  Lungs: Normal work of breathing. Neurologic: No focal deficits.   Lab Results  Component Value Date   CREATININE 0.98 01/07/2020   BUN 10 01/07/2020   NA 140 01/07/2020   K 4.2 01/07/2020   CL 107 (H) 01/07/2020   CO2 21 01/07/2020   Lab Results  Component Value Date   ALT 29 01/07/2020   AST 30 01/07/2020   ALKPHOS 108 01/07/2020   BILITOT 0.2 01/07/2020   Lab Results  Component Value Date   HGBA1C 5.5 01/07/2020   HGBA1C 5.7 01/18/2016   Lab Results  Component Value Date   INSULIN 8.1 01/07/2020   Lab Results  Component Value Date   TSH 0.588 01/07/2020   Lab Results  Component Value Date   CHOL 140 01/07/2020   HDL 66 01/07/2020   LDLCALC 57 01/07/2020   TRIG 90 01/07/2020   CHOLHDL 2.5 03/08/2013   Lab Results  Component Value Date   WBC 6.3 01/07/2020   HGB 12.4 01/07/2020   HCT 37.4 01/07/2020   MCV 86 01/07/2020   PLT 238 01/07/2020   Lab Results  Component Value Date   IRON 57 01/07/2020   TIBC 267 01/07/2020   FERRITIN 272 (H) 01/07/2020   Attestation Statements:   Reviewed by clinician on day of visit: allergies, medications, problem list, medical history, surgical history, family history, social history, and previous encounter notes.   Wilhemena Durie, am acting as Location manager for [employer], FNP-C.  I have reviewed the above documentation for accuracy and completeness, and I agree with the above. -  Georgianne Fick, FNP

## 2020-05-05 ENCOUNTER — Ambulatory Visit (INDEPENDENT_AMBULATORY_CARE_PROVIDER_SITE_OTHER): Payer: 59 | Admitting: Family Medicine

## 2020-05-12 ENCOUNTER — Ambulatory Visit (INDEPENDENT_AMBULATORY_CARE_PROVIDER_SITE_OTHER): Payer: 59 | Admitting: Adult Health

## 2020-05-12 ENCOUNTER — Encounter (INDEPENDENT_AMBULATORY_CARE_PROVIDER_SITE_OTHER): Payer: Self-pay | Admitting: Adult Health

## 2020-05-12 ENCOUNTER — Other Ambulatory Visit: Payer: Self-pay

## 2020-05-12 VITALS — BP 96/62 | HR 71 | Temp 98.4°F | Ht 63.0 in | Wt 168.0 lb

## 2020-05-12 DIAGNOSIS — E8881 Metabolic syndrome: Secondary | ICD-10-CM

## 2020-05-12 DIAGNOSIS — E559 Vitamin D deficiency, unspecified: Secondary | ICD-10-CM | POA: Diagnosis not present

## 2020-05-12 DIAGNOSIS — D508 Other iron deficiency anemias: Secondary | ICD-10-CM | POA: Diagnosis not present

## 2020-05-12 DIAGNOSIS — Z6830 Body mass index (BMI) 30.0-30.9, adult: Secondary | ICD-10-CM

## 2020-05-12 DIAGNOSIS — Z9189 Other specified personal risk factors, not elsewhere classified: Secondary | ICD-10-CM

## 2020-05-12 DIAGNOSIS — D649 Anemia, unspecified: Secondary | ICD-10-CM | POA: Insufficient documentation

## 2020-05-12 DIAGNOSIS — E669 Obesity, unspecified: Secondary | ICD-10-CM

## 2020-05-12 MED ORDER — METFORMIN HCL 500 MG PO TABS: 500.0000 mg | ORAL_TABLET | Freq: Every day | ORAL | 0 refills | Status: DC

## 2020-05-12 MED ORDER — VITAMIN D (ERGOCALCIFEROL) 1.25 MG (50000 UNIT) PO CAPS: 50000.0000 [IU] | ORAL_CAPSULE | ORAL | 0 refills | Status: DC

## 2020-05-12 NOTE — Progress Notes (Signed)
Chief Complaint:   OBESITY Sara Patel is here to discuss her progress with her obesity treatment plan along with follow-up of her obesity related diagnoses. Sara Patel is on the Category 2 Plan and states she is following her eating plan approximately 75% of the time. Sara Patel states she is walking 30 minutes 3 times per week.  Today's visit was #: 8 Starting weight: 186 lbs Starting date: 01/07/2020 Today's weight: 168 lbs Today's date: 05/12/2020 Total lbs lost to date: 18 Total lbs lost since last in-office visit: 0  Interim History: Sara Patel recently sold her home and is transitioning to an apartment in [place], [place], which has made meal planning and prepping a challenge. She has been trying to make the healthiest food choices during the last several weeks. She denies polyphagia.  Subjective:   Insulin resistance. Sara Patel has a diagnosis of insulin resistance based on her elevated fasting insulin level >5. She continues to work on diet and exercise to decrease her risk of diabetes. Sara Patel is on metformin 500 mg daily and tolerating it well. She denies polyphagia.  Lab Results  Component Value Date   INSULIN 8.1 01/07/2020   Lab Results  Component Value Date   HGBA1C 5.5 01/07/2020   Vitamin D deficiency. Sara Patel is on prescription Vitamin D supplementation. Last Vitamin D was 24.5 on 01/07/2020.  Other iron deficiency anemia. Sara Patel receives iron infusions 1-2 times a year. She is unable to take oral supplementation due to Crohn's disease.  CBC Latest Ref Rng & Units 01/07/2020 09/20/2019 09/17/2019  WBC 3.4 - 10.8 x10E3/uL 6.3 17.4(H) 9.5  Hemoglobin 11.1 - 15.9 g/dL 12.4 10.0(L) 10.6(L)  Hematocrit 34.0 - 46.6 % 37.4 30.7(L) 32.2(L)  Platelets 150 - 450 x10E3/uL 238 177 183   Lab Results  Component Value Date   IRON 57 01/07/2020   TIBC 267 01/07/2020   FERRITIN 272 (H) 01/07/2020   Lab Results  Component Value Date   [id number]  01/07/2020   At risk for complication associated with hypotension. The patient is at a higher than average risk of hypotension due to steady weight loss.  Assessment/Plan:   Insulin resistance. Sara Patel will continue to work on weight loss, exercise, and decreasing simple carbohydrates to help decrease the risk of diabetes. Sara Patel agreed to follow-up with [place] as directed to closely monitor her progress. Refill was given for metFORMIN (GLUCOPHAGE) 500 MG tablet daily #30 with 0 refills. Comprehensive metabolic panel, CBC with Differential/Platelet, Hemoglobin A1c, Insulin, random, Lipid Panel With LDL/HDL Ratio labs ordered today.  Vitamin D deficiency. Low Vitamin D level contributes to fatigue and are associated with obesity, breast, and colon cancer. She was given a refill on her Vitamin D, Ergocalciferol, (DRISDOL) 1.25 MG (50000 UNIT) CAPS capsule every week #4 with 0 refills and VITAMIN D 25 Hydroxy (Vit-D Deficiency, Fractures) level will be checked today.   Other iron deficiency anemia. Orders and follow up as documented in patient record. Sara Patel will continue follow-up with [facility] as directed. Comprehensive metabolic panel, CBC with Differential/Platelet, Hemoglobin A1c, Lipid Panel With LDL/HDL Ratio labs ordered today.  Counseling . Iron is essential for our bodies to make red blood cells.  Reasons that someone may be deficient include: an iron-deficient diet (more likely in those following vegan or vegetarian diets), women with heavy menses, patients with GI disorders or poor absorption, patients that have had bariatric surgery, frequent blood donors, patients with cancer, and patients with heart disease.   Sara Patel Kitchen An iron supplement  has been recommended. This is found over-the-counter.   Sara Patel foods include dark leafy greens, red and white meats, eggs, seafood, and beans.   . Certain foods and drinks prevent your body from absorbing iron properly. Avoid eating these foods in  the same meal as iron-rich foods or with iron supplements. These foods include: coffee, black tea, and red wine; milk, dairy products, and foods that are high in calcium; beans and soybeans; whole grains.  . Constipation can be a side effect of iron supplementation. Increased water and fiber intake are helpful. Water goal: > 2 liters/day. Fiber goal: > 25 grams/day.   At risk for complication associated with hypotension. Sara Patel was given approximately 15 minutes of education and counseling today to help avoid hypotension. We discussed risks of hypotension with weight loss and signs of hypotension such as feeling lightheaded or unsteady.  Repetitive spaced learning was employed today to elicit superior memory formation and behavioral change.  Class 1 obesity with serious comorbidity and body mass index (BMI) of 30.0 to 30.9 in adult, unspecified obesity type - BMI greater than 30.  Sara Patel is currently in the action stage of change. As such, her goal is to continue with weight loss efforts. She has agreed to the Category 2 Plan.   Handout was provided on High Protein/Low Calorie foods.  Exercise goals: Sara Patel will continue her current exercise regimen.  Behavioral modification strategies: increasing lean protein intake, meal planning and cooking strategies, travel eating strategies and planning for success.  Sara Patel has agreed to follow-up with our clinic in 3-4 weeks. She was informed of the importance of frequent follow-up visits to maximize her success with intensive lifestyle modifications for her multiple health conditions.   Sara Patel was informed we would discuss her lab results at her next visit unless there is a critical issue that needs to be addressed sooner. Sara Patel agreed to keep her next visit at the agreed upon time to discuss these results.  Objective:   Blood pressure 96/62, pulse 71, temperature 98.4 F (36.9 C), temperature source Oral, height 5' 3"  (1.6 m),  weight 168 lb (76.2 kg), last menstrual period 05/08/2020, SpO2 99 %, not currently breastfeeding. Body mass index is 29.76 kg/m.  General: Cooperative, alert, well developed, in no acute distress. HEENT: Conjunctivae and lids unremarkable. Cardiovascular: Regular rhythm.  Lungs: Normal work of breathing. Neurologic: No focal deficits.   Lab Results  Component Value Date   CREATININE 0.98 01/07/2020   BUN 10 01/07/2020   NA 140 01/07/2020   K 4.2 01/07/2020   CL 107 (H) 01/07/2020   CO2 21 01/07/2020   Lab Results  Component Value Date   ALT 29 01/07/2020   AST 30 01/07/2020   ALKPHOS 108 01/07/2020   BILITOT 0.2 01/07/2020   Lab Results  Component Value Date   HGBA1C 5.5 01/07/2020   HGBA1C 5.7 01/18/2016   Lab Results  Component Value Date   INSULIN 8.1 01/07/2020   Lab Results  Component Value Date   TSH 0.588 01/07/2020   Lab Results  Component Value Date   CHOL 140 01/07/2020   HDL 66 01/07/2020   LDLCALC 57 01/07/2020   TRIG 90 01/07/2020   CHOLHDL 2.5 03/08/2013   Lab Results  Component Value Date   WBC 6.3 01/07/2020   HGB 12.4 01/07/2020   HCT 37.4 01/07/2020   MCV 86 01/07/2020   PLT 238 01/07/2020   Lab Results  Component Value Date   IRON 57 01/07/2020  TIBC 267 01/07/2020   FERRITIN 272 (H) 01/07/2020   Attestation Statements:   Reviewed by clinician on day of visit: allergies, medications, problem list, medical history, surgical history, family history, social history, and previous encounter notes.  I, Michaelene Song, am acting as Location manager for [employer], NP-C   I have reviewed the above documentation for accuracy and completeness, and I agree with the above. -  Esaw Grandchild, NP

## 2020-05-13 LAB — LIPID PANEL WITH LDL/HDL RATIO
Cholesterol, Total: 119 mg/dL (ref 100–199)
HDL: 53 mg/dL (ref >39)
LDL Chol Calc (NIH): 50 mg/dL (ref 0–99)
LDL/HDL Ratio: 0.9 ratio (ref 0.0–3.2)
Triglycerides: 83 mg/dL (ref 0–149)
VLDL Cholesterol Cal: 16 mg/dL (ref 5–40)

## 2020-05-13 LAB — COMPREHENSIVE METABOLIC PANEL
ALT: 40 IU/L — ABNORMAL HIGH (ref 0–32)
AST: 29 IU/L (ref 0–40)
Albumin/Globulin Ratio: 1.1 — ABNORMAL LOW (ref 1.2–2.2)
Albumin: 4 g/dL (ref 3.8–4.8)
Alkaline Phosphatase: 121 IU/L (ref 48–121)
BUN/Creatinine Ratio: 11 (ref 9–23)
BUN: 10 mg/dL (ref 6–20)
Bilirubin Total: 0.2 mg/dL (ref 0.0–1.2)
CO2: 24 mmol/L (ref 20–29)
Calcium: 9.2 mg/dL (ref 8.7–10.2)
Chloride: 108 mmol/L — ABNORMAL HIGH (ref 96–106)
Creatinine, Ser: 0.95 mg/dL (ref 0.57–1.00)
GFR calc Af Amer: 90 mL/min/{1.73_m2} (ref >59)
GFR calc non Af Amer: 78 mL/min/{1.73_m2} (ref >59)
Globulin, Total: 3.5 g/dL (ref 1.5–4.5)
Glucose: 90 mg/dL (ref 65–99)
Potassium: 4.4 mmol/L (ref 3.5–5.2)
Sodium: 142 mmol/L (ref 134–144)
Total Protein: 7.5 g/dL (ref 6.0–8.5)

## 2020-05-13 LAB — HEMOGLOBIN A1C
Est. average glucose Bld gHb Est-mCnc: 108 mg/dL
Hgb A1c MFr Bld: 5.4 % (ref 4.8–5.6)

## 2020-05-13 LAB — CBC WITH DIFFERENTIAL/PLATELET
Basophils Absolute: 0 10*3/uL (ref 0.0–0.2)
Basos: 1 %
EOS (ABSOLUTE): 0.2 10*3/uL (ref 0.0–0.4)
Eos: 4 %
Hematocrit: 36.6 % (ref 34.0–46.6)
Hemoglobin: 11.2 g/dL (ref 11.1–15.9)
Immature Grans (Abs): 0 10*3/uL (ref 0.0–0.1)
Immature Granulocytes: 0 %
Lymphocytes Absolute: 2.4 10*3/uL (ref 0.7–3.1)
Lymphs: 44 %
MCH: 26.4 pg — ABNORMAL LOW (ref 26.6–33.0)
MCHC: 30.6 g/dL — ABNORMAL LOW (ref 31.5–35.7)
MCV: 86 fL (ref 79–97)
Monocytes Absolute: 0.3 10*3/uL (ref 0.1–0.9)
Monocytes: 6 %
Neutrophils Absolute: 2.4 10*3/uL (ref 1.4–7.0)
Neutrophils: 45 %
Platelets: 249 10*3/uL (ref 150–450)
RBC: 4.25 x10E6/uL (ref 3.77–5.28)
RDW: 13.8 % (ref 11.7–15.4)
WBC: 5.3 10*3/uL (ref 3.4–10.8)

## 2020-05-13 LAB — INSULIN, RANDOM: INSULIN: 6.1 u[IU]/mL (ref 2.6–24.9)

## 2020-05-13 LAB — VITAMIN D 25 HYDROXY (VIT D DEFICIENCY, FRACTURES): Vit D, 25-Hydroxy: 27.3 ng/mL — ABNORMAL LOW (ref 30.0–100.0)

## 2020-06-09 ENCOUNTER — Ambulatory Visit (INDEPENDENT_AMBULATORY_CARE_PROVIDER_SITE_OTHER): Payer: 59 | Admitting: Adult Health

## 2020-06-25 ENCOUNTER — Encounter (INDEPENDENT_AMBULATORY_CARE_PROVIDER_SITE_OTHER): Payer: Self-pay | Admitting: Bariatrics

## 2020-06-25 ENCOUNTER — Other Ambulatory Visit: Payer: Self-pay

## 2020-06-25 ENCOUNTER — Ambulatory Visit (INDEPENDENT_AMBULATORY_CARE_PROVIDER_SITE_OTHER): Payer: BC Managed Care – PPO | Admitting: Bariatrics

## 2020-06-25 VITALS — BP 103/73 | HR 79 | Temp 98.2°F | Ht 63.0 in | Wt 165.0 lb

## 2020-06-25 DIAGNOSIS — E559 Vitamin D deficiency, unspecified: Secondary | ICD-10-CM

## 2020-06-25 DIAGNOSIS — E669 Obesity, unspecified: Secondary | ICD-10-CM

## 2020-06-25 DIAGNOSIS — Z9189 Other specified personal risk factors, not elsewhere classified: Secondary | ICD-10-CM

## 2020-06-25 DIAGNOSIS — Z6830 Body mass index (BMI) 30.0-30.9, adult: Secondary | ICD-10-CM

## 2020-06-25 DIAGNOSIS — E8881 Metabolic syndrome: Secondary | ICD-10-CM | POA: Diagnosis not present

## 2020-06-25 MED ORDER — METFORMIN HCL 500 MG PO TABS: 500.0000 mg | ORAL_TABLET | Freq: Every day | ORAL | 0 refills | Status: DC

## 2020-06-25 NOTE — Progress Notes (Signed)
Chief Complaint:   OBESITY Sara Patel is here to discuss her progress with her obesity treatment plan along with follow-up of her obesity related diagnoses. Sara Patel is on the Category 2 Plan and states she is following her eating plan approximately 75-80% of the time. Sara Patel states she is walking 20 minutes 2-3 times per week.  Today's visit was #: 9 Starting weight: 186 lbs Starting date: 01/07/2020 Today's weight: 165 lbs Today's date: 06/25/2020 Total lbs lost to date: 21 Total lbs lost since last in-office visit: 3  Interim History: Sara Patel is down 3 lbs and has done well overall. She recently moved, which made it harder but she is back on track.  Subjective:   Insulin resistance. Sara Patel has a diagnosis of insulin resistance based on her elevated fasting insulin level >5. She continues to work on diet and exercise to decrease her risk of diabetes. Sara Patel is taking metformin.  Lab Results  Component Value Date   INSULIN 6.1 05/12/2020   INSULIN 8.1 01/07/2020   Lab Results  Component Value Date   HGBA1C 5.4 05/12/2020   Vitamin D deficiency. Sara Patel is taking a multivitamin and a Vitamin D supplement.   Ref. Range 05/12/2020 12:27  Vitamin D, 25-Hydroxy Latest Ref Range: 30.0 - 100.0 ng/mL 27.3 (L)   At risk for diabetes mellitus. Sara Patel is at higher than average risk for developing diabetes due to insulin resistance.  Assessment/Plan:   Insulin resistance. Sara Patel will continue to work on weight loss, exercise, and decreasing simple carbohydrates to help decrease the risk of diabetes. Sara Patel agreed to follow-up with [place] as directed to closely monitor her progress. Prescription was given for metFORMIN (GLUCOPHAGE) 500 MG tablet 1 PO daily with breakfast #30 with 0 refills.  Vitamin D deficiency. Low Vitamin D level contributes to fatigue and are associated with obesity, breast, and colon cancer. She agrees to continue to take Vitamin D and  prenatal vitamins as directed and will follow-up for routine testing of Vitamin D, at least 2-3 times per year to avoid over-replacement.  At risk for diabetes mellitus. Sara Patel was given approximately 15 minutes of diabetes education and counseling today. We discussed intensive lifestyle modifications today with an emphasis on weight loss as well as increasing exercise and decreasing simple carbohydrates in her diet. We also reviewed medication options with an emphasis on risk versus benefit of those discussed.   Repetitive spaced learning was employed today to elicit superior memory formation and behavioral change.  Class 1 obesity with serious comorbidity and body mass index (BMI) of 30.0 to 30.9 in adult, unspecified obesity type - BMI greater than 30 at start.  Sara Patel is currently in the action stage of change. As such, her goal is to continue with weight loss efforts. She has agreed to the Category 2 Plan.   She will work on meal planning and intentional eating.   Handout was provided on Recipes.  Exercise goals: Sara Patel will continue to walk for exercise.  Behavioral modification strategies: increasing lean protein intake, decreasing simple carbohydrates, increasing vegetables, increasing water intake, decreasing eating out, no skipping meals, meal planning and cooking strategies, keeping healthy foods in the home and planning for success.  Sara Patel has agreed to follow-up with our clinic in 2 weeks. She was informed of the importance of frequent follow-up visits to maximize her success with intensive lifestyle modifications for her multiple health conditions.   Objective:   Blood pressure 103/73, pulse 79, temperature 98.2 F (36.8 C), height 5'  3" (1.6 m), weight 165 lb (74.8 kg), SpO2 100 %, not currently breastfeeding. Body mass index is 29.23 kg/m.  General: Cooperative, alert, well developed, in no acute distress. HEENT: Conjunctivae and lids  unremarkable. Cardiovascular: Regular rhythm.  Lungs: Normal work of breathing. Neurologic: No focal deficits.   Lab Results  Component Value Date   CREATININE 0.95 05/12/2020   BUN 10 05/12/2020   NA 142 05/12/2020   K 4.4 05/12/2020   CL 108 (H) 05/12/2020   CO2 24 05/12/2020   Lab Results  Component Value Date   ALT 40 (H) 05/12/2020   AST 29 05/12/2020   ALKPHOS 121 05/12/2020   BILITOT 0.2 05/12/2020   Lab Results  Component Value Date   HGBA1C 5.4 05/12/2020   HGBA1C 5.5 01/07/2020   HGBA1C 5.7 01/18/2016   Lab Results  Component Value Date   INSULIN 6.1 05/12/2020   INSULIN 8.1 01/07/2020   Lab Results  Component Value Date   TSH 0.588 01/07/2020   Lab Results  Component Value Date   CHOL 119 05/12/2020   HDL 53 05/12/2020   LDLCALC 50 05/12/2020   TRIG 83 05/12/2020   CHOLHDL 2.5 03/08/2013   Lab Results  Component Value Date   WBC 5.3 05/12/2020   HGB 11.2 05/12/2020   HCT 36.6 05/12/2020   MCV 86 05/12/2020   PLT 249 05/12/2020   Lab Results  Component Value Date   IRON 57 01/07/2020   TIBC 267 01/07/2020   FERRITIN 272 (H) 01/07/2020   Attestation Statements:   Reviewed by clinician on day of visit: allergies, medications, problem list, medical history, surgical history, family history, social history, and previous encounter notes.  Migdalia Dk, am acting as Location manager for [employer], DO   I have reviewed the above documentation for accuracy and completeness, and I agree with the above. Jearld Lesch, DO

## 2020-06-29 ENCOUNTER — Encounter (INDEPENDENT_AMBULATORY_CARE_PROVIDER_SITE_OTHER): Payer: Self-pay | Admitting: Bariatrics

## 2020-07-15 ENCOUNTER — Encounter (INDEPENDENT_AMBULATORY_CARE_PROVIDER_SITE_OTHER): Payer: Self-pay | Admitting: Family Medicine

## 2020-07-20 ENCOUNTER — Ambulatory Visit (INDEPENDENT_AMBULATORY_CARE_PROVIDER_SITE_OTHER): Payer: BC Managed Care – PPO | Admitting: Family Medicine

## 2020-07-23 ENCOUNTER — Other Ambulatory Visit (INDEPENDENT_AMBULATORY_CARE_PROVIDER_SITE_OTHER): Payer: Self-pay | Admitting: Bariatrics

## 2020-07-23 DIAGNOSIS — E8881 Metabolic syndrome: Secondary | ICD-10-CM

## 2020-07-23 DIAGNOSIS — E88819 Insulin resistance, unspecified: Secondary | ICD-10-CM

## 2020-07-30 ENCOUNTER — Encounter (INDEPENDENT_AMBULATORY_CARE_PROVIDER_SITE_OTHER): Payer: Self-pay | Admitting: Family Medicine

## 2020-08-04 ENCOUNTER — Ambulatory Visit (INDEPENDENT_AMBULATORY_CARE_PROVIDER_SITE_OTHER): Payer: BC Managed Care – PPO | Admitting: Family Medicine

## 2020-08-06 ENCOUNTER — Other Ambulatory Visit (INDEPENDENT_AMBULATORY_CARE_PROVIDER_SITE_OTHER): Payer: Self-pay | Admitting: Bariatrics

## 2020-08-06 DIAGNOSIS — E8881 Metabolic syndrome: Secondary | ICD-10-CM

## 2020-08-17 ENCOUNTER — Other Ambulatory Visit (INDEPENDENT_AMBULATORY_CARE_PROVIDER_SITE_OTHER): Payer: Self-pay | Admitting: Bariatrics

## 2020-08-17 DIAGNOSIS — E8881 Metabolic syndrome: Secondary | ICD-10-CM

## 2020-11-11 ENCOUNTER — Ambulatory Visit (INDEPENDENT_AMBULATORY_CARE_PROVIDER_SITE_OTHER): Payer: BC Managed Care – PPO | Admitting: Family Medicine

## 2020-11-11 ENCOUNTER — Encounter (INDEPENDENT_AMBULATORY_CARE_PROVIDER_SITE_OTHER): Payer: Self-pay | Admitting: Family Medicine

## 2020-11-11 ENCOUNTER — Other Ambulatory Visit: Payer: Self-pay

## 2020-11-11 VITALS — BP 100/66 | HR 75 | Temp 98.6°F | Ht 64.0 in | Wt 160.0 lb

## 2020-11-11 DIAGNOSIS — K50918 Crohn's disease, unspecified, with other complication: Secondary | ICD-10-CM

## 2020-11-11 DIAGNOSIS — E559 Vitamin D deficiency, unspecified: Secondary | ICD-10-CM

## 2020-11-11 DIAGNOSIS — E8881 Metabolic syndrome: Secondary | ICD-10-CM

## 2020-11-11 DIAGNOSIS — E669 Obesity, unspecified: Secondary | ICD-10-CM

## 2020-11-11 DIAGNOSIS — Z6830 Body mass index (BMI) 30.0-30.9, adult: Secondary | ICD-10-CM

## 2020-11-11 DIAGNOSIS — E88819 Insulin resistance, unspecified: Secondary | ICD-10-CM

## 2020-11-11 MED ORDER — METFORMIN HCL 500 MG PO TABS: 500.0000 mg | ORAL_TABLET | Freq: Every day | ORAL | 0 refills | Status: DC

## 2020-11-11 MED ORDER — VITAMIN D (ERGOCALCIFEROL) 1.25 MG (50000 UNIT) PO CAPS: 50000.0000 [IU] | ORAL_CAPSULE | ORAL | 0 refills | Status: DC

## 2020-11-11 NOTE — Progress Notes (Signed)
Chief Complaint:   OBESITY Sara Patel is here to discuss her progress with her obesity treatment plan along with follow-up of her obesity related diagnoses. Sara Patel is on the Category 2 Plan and states she is following her eating plan approximately 75% of the time. Sara Patel states she is walking for 20 minutes 3 times per week.  Today's visit was #: 10 Starting weight: 186 lbs Starting date: 01/07/2020 Today's weight: 160 lbs Today's date: 11/11/2020 Total lbs lost to date: 26 lbs Total lbs lost since last in-office visit: 5 lbs  Interim History: Sara Patel has not had an office visit since 06/25/2020, but has still lost 5 pounds.  She has moved and gone on a vacation, but has tried to make good choices.  She has been unable to come due to her work schedule and moving to [place].  Weight goal is 140-155 pounds.  She is 160 pounds today.  She has continued to follow Category 2 since last office visit.  Subjective:   1. Insulin resistance She is on metformin 500 mg daily and feels it helps with appetite.   Lab Results  Component Value Date   INSULIN 6.1 05/12/2020   INSULIN 8.1 01/07/2020   Lab Results  Component Value Date   HGBA1C 5.4 05/12/2020   2. Crohn's disease with other complication, unspecified gastrointestinal tract location [facility]) Had recent endoscopy, which found active Crohn's in small bowel.  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  Humira discontinued and Stelara started for Crohn's  3. Vitamin D deficiency Vitamin D low at 27.3.  She is on weekly prescription vitamin D.  Assessment/Plan:   1. Insulin resistance Check labs at next office visit.  Will refill metformin 500 mg daily, as per below.  - metFORMIN (GLUCOPHAGE) 500 MG tablet; Take 1 tablet (500 mg total) by mouth daily with breakfast.  Dispense: 90 tablet; Refill: 0  2. Crohn's disease with other complication, unspecified gastrointestinal tract location [facility]) Continue with  GI and Rheumatology.  3. Vitamin D deficiency Check vitamin D level at next office visit.  Will refill vitamin D 50,000 IU weekly, as per below.  - Vitamin D, Ergocalciferol, (DRISDOL) 1.25 MG (50000 UNIT) CAPS capsule; Take 1 capsule (50,000 Units total) by mouth every 7 (seven) days.  Dispense: 12 capsule; Refill: 0  4. Class 1 obesity with serious comorbidity and body mass index (BMI) of 30.0 to 30.9 in adult, unspecified obesity type  Sara Patel is currently in the action stage of change. As such, her goal is to continue with weight loss efforts. She has agreed to the Category 2 Plan and keeping a food journal and adhering to recommended goals of 400-500 calories and 35 grams of protein daily.   Exercise goals: As is.  Behavioral modification strategies: increasing lean protein intake and decreasing simple carbohydrates.  Sara Patel has agreed to follow-up with our clinic in 8 weeks due to recent move to [place].  She will come fasting.  Objective:   Blood pressure 100/66, pulse 75, temperature 98.6 F (37 C), height 5\' 4"  (1.626 m), weight 160 lb (72.6 kg), last menstrual period 10/23/2020, SpO2 97 %, not currently breastfeeding. Body mass index is 27.46 kg/m.  General: Cooperative, alert, well developed, in no acute distress. HEENT: Conjunctivae and lids unremarkable. Cardiovascular: Regular rhythm.  Lungs: Normal work of breathing. Neurologic: No focal deficits.   Lab Results  Component Value Date   CREATININE 0.95 05/12/2020   BUN 10 05/12/2020  NA 142 05/12/2020   K 4.4 05/12/2020   CL 108 (H) 05/12/2020   CO2 24 05/12/2020   Lab Results  Component Value Date   ALT 40 (H) 05/12/2020   AST 29 05/12/2020   ALKPHOS 121 05/12/2020   BILITOT 0.2 05/12/2020   Lab Results  Component Value Date   HGBA1C 5.4 05/12/2020   HGBA1C 5.5 01/07/2020   HGBA1C 5.7 01/18/2016   Lab Results  Component Value Date   INSULIN 6.1 05/12/2020   INSULIN 8.1 01/07/2020   Lab  Results  Component Value Date   TSH 0.588 01/07/2020   Lab Results  Component Value Date   CHOL 119 05/12/2020   HDL 53 05/12/2020   LDLCALC 50 05/12/2020   TRIG 83 05/12/2020   CHOLHDL 2.5 03/08/2013   Lab Results  Component Value Date   WBC 5.3 05/12/2020   HGB 11.2 05/12/2020   HCT 36.6 05/12/2020   MCV 86 05/12/2020   PLT 249 05/12/2020   Lab Results  Component Value Date   IRON 57 01/07/2020   TIBC 267 01/07/2020   FERRITIN 272 (H) 01/07/2020   Attestation Statements:   Reviewed by clinician on day of visit: allergies, medications, problem list, medical history, surgical history, family history, social history, and previous encounter notes.  I, Insurance claims handler, CMA, am acting as Energy manager for [place], FNP.  I have reviewed the above documentation for accuracy and completeness, and I agree with the above. -  Jesse Sans, FNP

## 2021-01-07 ENCOUNTER — Ambulatory Visit (INDEPENDENT_AMBULATORY_CARE_PROVIDER_SITE_OTHER): Payer: BC Managed Care – PPO | Admitting: Family Medicine

## 2021-02-01 ENCOUNTER — Other Ambulatory Visit (INDEPENDENT_AMBULATORY_CARE_PROVIDER_SITE_OTHER): Payer: Self-pay | Admitting: Family Medicine

## 2021-02-01 DIAGNOSIS — E559 Vitamin D deficiency, unspecified: Secondary | ICD-10-CM

## 2021-02-01 NOTE — Telephone Encounter (Signed)
Refill request

## 2021-02-25 ENCOUNTER — Ambulatory Visit (INDEPENDENT_AMBULATORY_CARE_PROVIDER_SITE_OTHER): Payer: BC Managed Care – PPO | Admitting: Family Medicine

## 2021-03-14 ENCOUNTER — Encounter (INDEPENDENT_AMBULATORY_CARE_PROVIDER_SITE_OTHER): Payer: Self-pay | Admitting: Family Medicine

## 2021-03-14 DIAGNOSIS — E8881 Metabolic syndrome: Secondary | ICD-10-CM

## 2021-03-14 DIAGNOSIS — E88819 Insulin resistance, unspecified: Secondary | ICD-10-CM

## 2021-03-14 DIAGNOSIS — E559 Vitamin D deficiency, unspecified: Secondary | ICD-10-CM

## 2021-03-15 MED ORDER — VITAMIN D (ERGOCALCIFEROL) 1.25 MG (50000 UNIT) PO CAPS: 50000.0000 [IU] | ORAL_CAPSULE | ORAL | 0 refills | Status: DC

## 2021-03-15 MED ORDER — METFORMIN HCL 500 MG PO TABS: 500.0000 mg | ORAL_TABLET | Freq: Every day | ORAL | 0 refills | Status: DC

## 2021-03-15 NOTE — Telephone Encounter (Signed)
Refill request

## 2021-03-18 ENCOUNTER — Ambulatory Visit (INDEPENDENT_AMBULATORY_CARE_PROVIDER_SITE_OTHER): Payer: BC Managed Care – PPO | Admitting: Physician Assistant

## 2021-04-28 ENCOUNTER — Encounter (INDEPENDENT_AMBULATORY_CARE_PROVIDER_SITE_OTHER): Payer: Self-pay | Admitting: Physician Assistant

## 2021-04-28 ENCOUNTER — Ambulatory Visit (INDEPENDENT_AMBULATORY_CARE_PROVIDER_SITE_OTHER): Payer: BC Managed Care – PPO | Admitting: Physician Assistant

## 2021-04-28 ENCOUNTER — Other Ambulatory Visit (INDEPENDENT_AMBULATORY_CARE_PROVIDER_SITE_OTHER): Payer: Self-pay | Admitting: Physician Assistant

## 2021-04-28 ENCOUNTER — Other Ambulatory Visit: Payer: Self-pay

## 2021-04-28 VITALS — BP 108/72 | HR 81 | Temp 98.3°F | Ht 64.0 in | Wt 166.0 lb

## 2021-04-28 DIAGNOSIS — Z6833 Body mass index (BMI) 33.0-33.9, adult: Secondary | ICD-10-CM

## 2021-04-28 DIAGNOSIS — E559 Vitamin D deficiency, unspecified: Secondary | ICD-10-CM | POA: Diagnosis not present

## 2021-04-28 DIAGNOSIS — E8881 Metabolic syndrome: Secondary | ICD-10-CM

## 2021-04-28 DIAGNOSIS — Z9189 Other specified personal risk factors, not elsewhere classified: Secondary | ICD-10-CM | POA: Diagnosis not present

## 2021-04-28 DIAGNOSIS — E669 Obesity, unspecified: Secondary | ICD-10-CM | POA: Diagnosis not present

## 2021-04-28 MED ORDER — VITAMIN D (ERGOCALCIFEROL) 1.25 MG (50000 UNIT) PO CAPS: 50000.0000 [IU] | ORAL_CAPSULE | ORAL | 0 refills | Status: DC

## 2021-04-28 MED ORDER — METFORMIN HCL 500 MG PO TABS: 500.0000 mg | ORAL_TABLET | Freq: Every day | ORAL | 0 refills | Status: DC

## 2021-04-29 LAB — COMPREHENSIVE METABOLIC PANEL
ALT: 22 IU/L (ref 0–32)
AST: 18 IU/L (ref 0–40)
Albumin/Globulin Ratio: 1 — ABNORMAL LOW (ref 1.2–2.2)
Albumin: 3.6 g/dL — ABNORMAL LOW (ref 3.8–4.8)
Alkaline Phosphatase: 77 IU/L (ref 44–121)
BUN/Creatinine Ratio: 12 (ref 9–23)
BUN: 11 mg/dL (ref 6–20)
Bilirubin Total: <0.2 mg/dL (ref 0.0–1.2)
CO2: 19 mmol/L — ABNORMAL LOW (ref 20–29)
Calcium: 8.6 mg/dL — ABNORMAL LOW (ref 8.7–10.2)
Chloride: 109 mmol/L — ABNORMAL HIGH (ref 96–106)
Creatinine, Ser: 0.94 mg/dL (ref 0.57–1.00)
Globulin, Total: 3.6 g/dL (ref 1.5–4.5)
Glucose: 87 mg/dL (ref 65–99)
Potassium: 4.1 mmol/L (ref 3.5–5.2)
Sodium: 140 mmol/L (ref 134–144)
Total Protein: 7.2 g/dL (ref 6.0–8.5)
eGFR: 81 mL/min/{1.73_m2} (ref >59)

## 2021-04-29 LAB — INSULIN, RANDOM: INSULIN: 6.1 u[IU]/mL (ref 2.6–24.9)

## 2021-04-29 LAB — HEMOGLOBIN A1C
Est. average glucose Bld gHb Est-mCnc: 111 mg/dL
Hgb A1c MFr Bld: 5.5 % (ref 4.8–5.6)

## 2021-04-29 LAB — VITAMIN D 25 HYDROXY (VIT D DEFICIENCY, FRACTURES): Vit D, 25-Hydroxy: 27 ng/mL — ABNORMAL LOW (ref 30.0–100.0)

## 2021-05-05 NOTE — Progress Notes (Signed)
Chief Complaint:   OBESITY Sara Patel is here to discuss her progress with her obesity treatment plan along with follow-up of her obesity related diagnoses. Sara Patel is on the Category 2 Plan and states she is following her eating plan approximately 60% of the time. Sara Patel states she is walking  30 minutes 3 times per week.  Today's visit was #: 11  Starting weight: 186 lbs Starting date: 01/07/2020  Today's weight: 160 lbs Today's date: 04/28/2021 Total lbs lost to date: 20 Total lbs lost since last in-office visit: 0  Interim History: This is her first OV since 11/11/20. She is now living in [place].  She works 8-4:30. She is snacking more often due to being at home with her daughter who is in speech therapy and working on swallowing. They have been eating out more than normal.  Subjective:   1. Vitamin D deficiency Sara Patel's Vitamin D level was 27.0 on 04/28/2021. She is currently taking prescription vitamin D 50,000 IU each week. She denies nausea, vomiting or muscle weakness. Sara Patel is on vitamin D and tolerating it well.  2. Insulin resistance Sara Patel has a diagnosis of insulin resistance based on her elevated fasting insulin level >5. She continues to work on diet and exercise to decrease her risk of diabetes. Sara Patel is on Metformin and is well tolerated.  Lab Results  Component Value Date   INSULIN 6.1 05/12/2020   INSULIN 8.1 01/07/2020   Lab Results  Component Value Date   HGBA1C 5.5 04/28/2021    3. At risk for diabetes mellitus Sara Patel is at higher than average risk for developing diabetes due to obesity  Assessment/Plan:   1. Vitamin D deficiency Low Vitamin D level contributes to fatigue and are associated with obesity, breast, and colon cancer. She agrees to continue to take prescription Vitamin D @50 ,000 IU every week and will follow-up for routine testing of Vitamin D, at least 2-3 times per year to avoid over-replacement.  - Vitamin  D, Ergocalciferol, (DRISDOL) 1.25 MG (50000 UNIT) CAPS capsule; Take 1 capsule (50,000 Units total) by mouth every 7 (seven) days.  Dispense: 8 capsule; Refill: 0 - VITAMIN D 25 Hydroxy (Vit-D Deficiency, Fractures)  2. Insulin resistance Sara Patel will continue to work on weight loss, exercise, and decreasing simple carbohydrates to help decrease the risk of diabetes. Sara Patel agreed to follow-up with [place] as directed to closely monitor her progress. Check labs and refill Metformin.  - metFORMIN (GLUCOPHAGE) 500 MG tablet; Take 1 tablet (500 mg total) by mouth daily with breakfast.  Dispense: 60 tablet; Refill: 0 - Hemoglobin A1c - Comprehensive metabolic panel - Insulin, random  3. At risk for diabetes mellitus Sara Patel was given approximately 15 minutes of diabetes education and counseling today. We discussed intensive lifestyle modifications today with an emphasis on weight loss as well as increasing exercise and decreasing simple carbohydrates in her diet. We also reviewed medication options with an emphasis on risk versus benefit of those discussed.   Repetitive spaced learning was employed today to elicit superior memory formation and behavioral change.   4. Class 1 obesity with serious comorbidity and body mass index (BMI) of 33.0 to 33.9 in adult, unspecified obesity type   Sara Patel is currently in the action stage of change. As such, her goal is to continue with weight loss efforts. She has agreed to the Category 2 Plan.   Exercise goals: As is  Behavioral modification strategies: meal planning and cooking strategies and keeping healthy foods in the  home.  Sara Patel has agreed to follow-up with our clinic in 8 weeks. She was informed of the importance of frequent follow-up visits to maximize her success with intensive lifestyle modifications for her multiple health conditions.   Sara Patel was informed we would discuss her lab results at her next visit unless there is a critical  issue that needs to be addressed sooner. Sara Patel agreed to keep her next visit at the agreed upon time to discuss these results.  Objective:   Blood pressure 108/72, pulse 81, temperature 98.3 F (36.8 C), height 5' 4"  (1.626 m), weight 166 lb (75.3 kg), SpO2 100 %, not currently breastfeeding. Body mass index is 28.49 kg/m.  General: Cooperative, alert, well developed, in no acute distress. HEENT: Conjunctivae and lids unremarkable. Cardiovascular: Regular rhythm.  Lungs: Normal work of breathing. Neurologic: No focal deficits.   Lab Results  Component Value Date   HGBA1C 5.4 05/12/2020   HGBA1C 5.5 01/07/2020   HGBA1C 5.7 01/18/2016   Lab Results  Component Value Date   INSULIN 6.1 05/12/2020   INSULIN 8.1 01/07/2020   Lab Results  Component Value Date   TSH 0.588 01/07/2020   Lab Results  Component Value Date   CHOL 119 05/12/2020   HDL 53 05/12/2020   LDLCALC 50 05/12/2020   TRIG 83 05/12/2020   CHOLHDL 2.5 03/08/2013   Lab Results  Component Value Date   WBC 5.3 05/12/2020   HGB 11.2 05/12/2020   HCT 36.6 05/12/2020   MCV 86 05/12/2020   PLT 249 05/12/2020   Lab Results  Component Value Date   IRON 57 01/07/2020   TIBC 267 01/07/2020   FERRITIN 272 (H) 01/07/2020    Attestation Statements:   Reviewed by clinician on day of visit: allergies, medications, problem list, medical history, surgical history, family history, social history, and previous encounter notes.  ILennette Bihari, CMA, am acting as Location manager for [employer], PA-C.  I have reviewed the above documentation for accuracy and completeness, and I agree with the above. Abby Potash, PA-C

## 2021-06-18 ENCOUNTER — Other Ambulatory Visit (INDEPENDENT_AMBULATORY_CARE_PROVIDER_SITE_OTHER): Payer: Self-pay | Admitting: Physician Assistant

## 2021-06-18 ENCOUNTER — Other Ambulatory Visit (INDEPENDENT_AMBULATORY_CARE_PROVIDER_SITE_OTHER): Payer: Self-pay | Admitting: Family Medicine

## 2021-06-18 DIAGNOSIS — E8881 Metabolic syndrome: Secondary | ICD-10-CM

## 2021-06-21 ENCOUNTER — Encounter (INDEPENDENT_AMBULATORY_CARE_PROVIDER_SITE_OTHER): Payer: Self-pay

## 2021-06-21 NOTE — Telephone Encounter (Signed)
Tracey 

## 2021-06-21 NOTE — Telephone Encounter (Signed)
Sara Patel 

## 2021-06-22 ENCOUNTER — Other Ambulatory Visit (INDEPENDENT_AMBULATORY_CARE_PROVIDER_SITE_OTHER): Payer: Self-pay | Admitting: Physician Assistant

## 2021-06-22 DIAGNOSIS — E8881 Metabolic syndrome: Secondary | ICD-10-CM

## 2021-06-22 NOTE — Telephone Encounter (Signed)
Sara Patel 

## 2021-06-23 ENCOUNTER — Ambulatory Visit (INDEPENDENT_AMBULATORY_CARE_PROVIDER_SITE_OTHER): Payer: BC Managed Care – PPO | Admitting: Family Medicine

## 2021-07-29 ENCOUNTER — Encounter (INDEPENDENT_AMBULATORY_CARE_PROVIDER_SITE_OTHER): Payer: Self-pay | Admitting: Family Medicine

## 2021-07-29 ENCOUNTER — Ambulatory Visit (INDEPENDENT_AMBULATORY_CARE_PROVIDER_SITE_OTHER): Payer: BC Managed Care – PPO | Admitting: Family Medicine

## 2021-07-29 ENCOUNTER — Other Ambulatory Visit: Payer: Self-pay

## 2021-07-29 VITALS — BP 103/68 | HR 85 | Temp 98.3°F | Ht 63.0 in | Wt 160.0 lb

## 2021-07-29 DIAGNOSIS — E669 Obesity, unspecified: Secondary | ICD-10-CM

## 2021-07-29 DIAGNOSIS — E559 Vitamin D deficiency, unspecified: Secondary | ICD-10-CM

## 2021-07-29 DIAGNOSIS — Z6832 Body mass index (BMI) 32.0-32.9, adult: Secondary | ICD-10-CM

## 2021-07-29 DIAGNOSIS — E8881 Metabolic syndrome: Secondary | ICD-10-CM

## 2021-07-29 MED ORDER — VITAMIN D (ERGOCALCIFEROL) 1.25 MG (50000 UNIT) PO CAPS: 50000.0000 [IU] | ORAL_CAPSULE | ORAL | 0 refills | Status: DC

## 2021-07-29 MED ORDER — METFORMIN HCL 500 MG PO TABS: 500.0000 mg | ORAL_TABLET | Freq: Two times a day (BID) | ORAL | 0 refills | Status: DC

## 2021-07-29 NOTE — Progress Notes (Signed)
Chief Complaint:   OBESITY Sara Patel is here to discuss her progress with her obesity treatment plan along with follow-up of her obesity related diagnoses. Sara Patel is on the Category 2 Plan and states she is following her eating plan approximately 80% of the time. Sara Patel states she is doing weight training and walking for 10-15 minutes 3 times per week.  Today's visit was #: 12 Starting weight: 186 lbs Starting date: 01/07/2020 Today's weight: 160 lbs Today's date: 07/29/2021 Total lbs lost to date: 26 lbs Total lbs lost since last in-office visit: 6 lbs  Interim History: Sara Patel travels 1.5 hrs to our clinic each away and follows up every 8 weeks. She notes very slow loss as she gets close to her goal weight of 155 lbs. She says she is open to adjusting her goal if need be.  Her daughter remains in speech  therapy for swallowing issues and Sara Patel struggles because she wants to demonstrate eating to her daughter. Sara Patel's husband is a patient at our clinic and has reached his goal weight.  Sara Patel notes that her Crohn's disease is well controlled at this pont on Stelara.  Subjective:   1. Insulin resistance Sara Patel is on Metformin. She notes some cravings in late afternoon. She does save some of her snack calories for the afternoon. Her A1C is 5.5.  Lab Results  Component Value Date   INSULIN 6.1 04/28/2021   INSULIN 6.1 05/12/2020   INSULIN 8.1 01/07/2020   Lab Results  Component Value Date   HGBA1C 5.5 04/28/2021    2. Vitamin D deficiency Sara Patel's Vitamin D remains low despite good compliance with weekly prescription Vitamin D. Her Vitamin D level is 27.  Lab Results  Component Value Date   VD25OH 27.0 (L) 04/28/2021   VD25OH 27.3 (L) 05/12/2020   VD25OH 24.5 (L) 01/07/2020    Assessment/Plan:   1. Insulin resistance Sara Patel agrees to increase dose of Metformin. We will refill increased dose of Metformin 500 mg twice daily with meals for 3  months with no refills. She will continue to work on weight loss, exercise, and decreasing simple carbohydrates to help decrease the risk of diabetes. Sara Patel agreed to follow-up with [place] as directed to closely monitor her progress.  - metFORMIN (GLUCOPHAGE) 500 MG tablet; Take 1 tablet (500 mg total) by mouth 2 (two) times daily with a meal.  Dispense: 180 tablet; Refill: 0  2. Vitamin D deficiency Sara Patel agrees to increase dose of Vitamin D prescription Vitamin D 50,000 IU every 3 days and she will follow-up for routine testing of Vitamin D, at least 2-3 times per year to avoid over-replacement. We will refill Vitamin D 50,000 IU for 1 month with no refills.  - Vitamin D, Ergocalciferol, (DRISDOL) 1.25 MG (50000 UNIT) CAPS capsule; Take 1 capsule (50,000 Units total) by mouth every 3 (three) days.  Dispense: 30 capsule; Refill: 0  3. Overweight: Current BMI 28.35 Sara Patel is currently in the action stage of change. As such, her goal is to continue with weight loss efforts. She has agreed to the Category 2 Plan.   Exercise goals:  Sara Patel will do cardio to 150 minutes/weeks.  Behavioral modification strategies: increasing lean protein intake and planning for success.  Sara Patel has agreed to follow-up with our clinic in 8 weeks.  Objective:   Blood pressure 103/68, pulse 85, temperature 98.3 F (36.8 C), height 5' 3"  (1.6 m), weight 160 lb (72.6 kg), SpO2 98 %, not currently breastfeeding. Body mass index  is 28.34 kg/m.  General: Cooperative, alert, well developed, in no acute distress. HEENT: Conjunctivae and lids unremarkable. Cardiovascular: Regular rhythm.  Lungs: Normal work of breathing. Neurologic: No focal deficits.   Lab Results  Component Value Date   CREATININE 0.94 04/28/2021   BUN 11 04/28/2021   NA 140 04/28/2021   K 4.1 04/28/2021   CL 109 (H) 04/28/2021   CO2 19 (L) 04/28/2021   Lab Results  Component Value Date   ALT 22 04/28/2021   AST 18 04/28/2021    ALKPHOS 77 04/28/2021   BILITOT <0.2 04/28/2021   Lab Results  Component Value Date   HGBA1C 5.5 04/28/2021   HGBA1C 5.4 05/12/2020   HGBA1C 5.5 01/07/2020   HGBA1C 5.7 01/18/2016   Lab Results  Component Value Date   INSULIN 6.1 04/28/2021   INSULIN 6.1 05/12/2020   INSULIN 8.1 01/07/2020   Lab Results  Component Value Date   TSH 0.588 01/07/2020   Lab Results  Component Value Date   CHOL 119 05/12/2020   HDL 53 05/12/2020   LDLCALC 50 05/12/2020   TRIG 83 05/12/2020   CHOLHDL 2.5 03/08/2013   Lab Results  Component Value Date   VD25OH 27.0 (L) 04/28/2021   VD25OH 27.3 (L) 05/12/2020   VD25OH 24.5 (L) 01/07/2020   Lab Results  Component Value Date   WBC 5.3 05/12/2020   HGB 11.2 05/12/2020   HCT 36.6 05/12/2020   MCV 86 05/12/2020   PLT 249 05/12/2020   Lab Results  Component Value Date   IRON 57 01/07/2020   TIBC 267 01/07/2020   FERRITIN 272 (H) 01/07/2020    Attestation Statements:   Reviewed by clinician on day of visit: allergies, medications, problem list, medical history, surgical history, family history, social history, and previous encounter notes.  I, Lizbeth Bark, RMA, am acting as Location manager for [employer], [place].   I have reviewed the above documentation for accuracy and completeness, and I agree with the above. -  Georgianne Fick, FNP

## 2021-09-10 ENCOUNTER — Encounter (INDEPENDENT_AMBULATORY_CARE_PROVIDER_SITE_OTHER): Payer: Self-pay | Admitting: Family Medicine

## 2021-09-15 ENCOUNTER — Ambulatory Visit (INDEPENDENT_AMBULATORY_CARE_PROVIDER_SITE_OTHER): Payer: BC Managed Care – PPO | Admitting: Family Medicine

## 2021-10-24 ENCOUNTER — Other Ambulatory Visit (INDEPENDENT_AMBULATORY_CARE_PROVIDER_SITE_OTHER): Payer: Self-pay | Admitting: Family Medicine

## 2021-10-24 DIAGNOSIS — E559 Vitamin D deficiency, unspecified: Secondary | ICD-10-CM

## 2021-10-28 ENCOUNTER — Other Ambulatory Visit (INDEPENDENT_AMBULATORY_CARE_PROVIDER_SITE_OTHER): Payer: Self-pay | Admitting: Family Medicine

## 2021-10-28 DIAGNOSIS — E8881 Metabolic syndrome: Secondary | ICD-10-CM

## 2021-11-25 ENCOUNTER — Encounter (INDEPENDENT_AMBULATORY_CARE_PROVIDER_SITE_OTHER): Payer: Self-pay | Admitting: Family Medicine

## 2022-01-10 ENCOUNTER — Ambulatory Visit (INDEPENDENT_AMBULATORY_CARE_PROVIDER_SITE_OTHER): Payer: BC Managed Care – PPO | Admitting: Bariatrics

## 2022-01-10 ENCOUNTER — Other Ambulatory Visit: Payer: Self-pay

## 2022-01-10 ENCOUNTER — Encounter (INDEPENDENT_AMBULATORY_CARE_PROVIDER_SITE_OTHER): Payer: Self-pay | Admitting: Bariatrics

## 2022-01-10 VITALS — BP 111/75 | HR 76 | Temp 98.2°F | Ht 63.0 in | Wt 164.0 lb

## 2022-01-10 DIAGNOSIS — Z6829 Body mass index (BMI) 29.0-29.9, adult: Secondary | ICD-10-CM | POA: Diagnosis not present

## 2022-01-10 DIAGNOSIS — E559 Vitamin D deficiency, unspecified: Secondary | ICD-10-CM

## 2022-01-10 DIAGNOSIS — Z6832 Body mass index (BMI) 32.0-32.9, adult: Secondary | ICD-10-CM

## 2022-01-10 DIAGNOSIS — E8881 Metabolic syndrome: Secondary | ICD-10-CM

## 2022-01-10 DIAGNOSIS — E669 Obesity, unspecified: Secondary | ICD-10-CM | POA: Diagnosis not present

## 2022-01-10 MED ORDER — VITAMIN D (ERGOCALCIFEROL) 1.25 MG (50000 UNIT) PO CAPS: 50000.0000 [IU] | ORAL_CAPSULE | ORAL | 0 refills | Status: DC

## 2022-01-10 MED ORDER — METFORMIN HCL 500 MG PO TABS: 500.0000 mg | ORAL_TABLET | Freq: Two times a day (BID) | ORAL | 0 refills | Status: DC

## 2022-01-10 NOTE — Progress Notes (Signed)
Chief Complaint:   OBESITY Sara Patel is here to discuss her progress with her obesity treatment plan along with follow-up of her obesity related diagnoses. Sara Patel is on the Category 2 Plan and states she is following her eating plan approximately 70% of the time. Sara Patel states she is doing weight training for 45 minutes 3 times per week.  Today's visit was #: 13 Starting weight: 186 lbs Starting date: 01/07/2020 Today's weight: 164 lbs Today's date: 01/10/2022 Total lbs lost to date: 22 Total lbs lost since last in-office visit: 0  Interim History: Sara Patel is up 4 lbs since her last visit. Her last visit here was 07/29/2021.  Subjective:   1. Insulin resistance Sara Patel is taking metformin as directed.  2. Vitamin D deficiency Sara Patel is taking prescription Vitamin D.  Assessment/Plan:   1. Insulin resistance Sara Patel will continue to work on weight loss, exercise, and decreasing simple carbohydrates to help decrease the risk of diabetes. We will refill metformin for 90 days with no refills. Sara Patel agreed to follow-up with [place] as directed to closely monitor her progress.  - metFORMIN (GLUCOPHAGE) 500 MG tablet; Take 1 tablet (500 mg total) by mouth 2 (two) times daily with a meal.  Dispense: 180 tablet; Refill: 0  2. Vitamin D deficiency Sara Patel will continue prescription Vitamin D 50,000 IU every week, until labs are done and then will will re-evaluate. We will refill prescription Vitamin D for 1 month. She will follow-up for routine testing of Vitamin D, at least 2-3 times per year to avoid over-replacement.  - Vitamin D, Ergocalciferol, (DRISDOL) 1.25 MG (50000 UNIT) CAPS capsule; Take 1 capsule (50,000 Units total) by mouth every 7 (seven) days.  Dispense: 4 capsule; Refill: 0  3. Overweight: Current BMI 29.1 Sara Patel is currently in the action stage of change. As such, her goal is to continue with weight loss efforts. She has agreed to the Category 2 Plan  or the Pescatarian Plan.   Meal planning, intentional eating. We will recheck fasting labs and IC at her next visit.  Exercise goals: As is.  Behavioral modification strategies: increasing lean protein intake, decreasing simple carbohydrates, increasing vegetables, increasing water intake, decreasing eating out, no skipping meals, meal planning and cooking strategies, and keeping healthy foods in the home.  Sara Patel has agreed to follow-up with our clinic in 3 weeks with myself or with William Hamburger, NP. She was informed of the importance of frequent follow-up visits to maximize her success with intensive lifestyle modifications for her multiple health conditions.   Objective:   Blood pressure 111/75, pulse 76, temperature 98.2 F (36.8 C), height 5\' 3"  (1.6 m), weight 164 lb (74.4 kg), last menstrual period 12/18/2021, SpO2 100 %. Body mass index is 29.05 kg/m.  General: Cooperative, alert, well developed, in no acute distress. HEENT: Conjunctivae and lids unremarkable. Cardiovascular: Regular rhythm.  Lungs: Normal work of breathing. Neurologic: No focal deficits.   Lab Results  Component Value Date   CREATININE 0.94 04/28/2021   BUN 11 04/28/2021   NA 140 04/28/2021   K 4.1 04/28/2021   CL 109 (H) 04/28/2021   CO2 19 (L) 04/28/2021   Lab Results  Component Value Date   ALT 22 04/28/2021   AST 18 04/28/2021   ALKPHOS 77 04/28/2021   BILITOT <0.2 04/28/2021   Lab Results  Component Value Date   HGBA1C 5.5 04/28/2021   HGBA1C 5.4 05/12/2020   HGBA1C 5.5 01/07/2020   HGBA1C 5.7 01/18/2016   Lab Results  Component Value Date   INSULIN 6.1 04/28/2021   INSULIN 6.1 05/12/2020   INSULIN 8.1 01/07/2020   Lab Results  Component Value Date   TSH 0.588 01/07/2020   Lab Results  Component Value Date   CHOL 119 05/12/2020   HDL 53 05/12/2020   LDLCALC 50 05/12/2020   TRIG 83 05/12/2020   CHOLHDL 2.5 03/08/2013   Lab Results  Component Value Date   VD25OH 27.0  (L) 04/28/2021   VD25OH 27.3 (L) 05/12/2020   VD25OH 24.5 (L) 01/07/2020   Lab Results  Component Value Date   WBC 5.3 05/12/2020   HGB 11.2 05/12/2020   HCT 36.6 05/12/2020   MCV 86 05/12/2020   PLT 249 05/12/2020   Lab Results  Component Value Date   IRON 57 01/07/2020   TIBC 267 01/07/2020   FERRITIN 272 (H) 01/07/2020   Attestation Statements:   Reviewed by clinician on day of visit: allergies, medications, problem list, medical history, surgical history, family history, social history, and previous encounter notes.   Trude Mcburney, am acting as Energy manager for [employer], DO.  I have reviewed the above documentation for accuracy and completeness, and I agree with the above. Corinna Capra, DO

## 2022-02-07 ENCOUNTER — Other Ambulatory Visit: Payer: Self-pay

## 2022-02-07 ENCOUNTER — Encounter (INDEPENDENT_AMBULATORY_CARE_PROVIDER_SITE_OTHER): Payer: Self-pay | Admitting: Bariatrics

## 2022-02-07 ENCOUNTER — Ambulatory Visit (INDEPENDENT_AMBULATORY_CARE_PROVIDER_SITE_OTHER): Payer: BC Managed Care – PPO | Admitting: Bariatrics

## 2022-02-07 VITALS — BP 111/78 | HR 83 | Temp 98.1°F | Ht 63.0 in | Wt 162.0 lb

## 2022-02-07 DIAGNOSIS — E8881 Metabolic syndrome: Secondary | ICD-10-CM | POA: Diagnosis not present

## 2022-02-07 DIAGNOSIS — R5383 Other fatigue: Secondary | ICD-10-CM

## 2022-02-07 DIAGNOSIS — R0602 Shortness of breath: Secondary | ICD-10-CM

## 2022-02-07 DIAGNOSIS — Z6828 Body mass index (BMI) 28.0-28.9, adult: Secondary | ICD-10-CM

## 2022-02-07 DIAGNOSIS — E559 Vitamin D deficiency, unspecified: Secondary | ICD-10-CM | POA: Diagnosis not present

## 2022-02-07 DIAGNOSIS — E669 Obesity, unspecified: Secondary | ICD-10-CM | POA: Diagnosis not present

## 2022-02-07 MED ORDER — METFORMIN HCL 500 MG PO TABS: 500.0000 mg | ORAL_TABLET | Freq: Two times a day (BID) | ORAL | 0 refills | Status: DC

## 2022-02-07 MED ORDER — VITAMIN D (ERGOCALCIFEROL) 1.25 MG (50000 UNIT) PO CAPS: 50000.0000 [IU] | ORAL_CAPSULE | ORAL | 0 refills | Status: DC

## 2022-02-09 ENCOUNTER — Encounter (INDEPENDENT_AMBULATORY_CARE_PROVIDER_SITE_OTHER): Payer: Self-pay | Admitting: Bariatrics

## 2022-02-09 NOTE — Progress Notes (Signed)
? ? ? ?Chief Complaint:  ? ?OBESITY ?Sara Patel is here to discuss her progress with her obesity treatment plan along with follow-up of her obesity related diagnoses. Sara Patel is on the Category 2 Plan and states she is following her eating plan approximately 80% of the time. Sara Patel states she is doing weight training for 30 minutes 3 times per week and walking for 20 minutes 3 times per week. ? ?Today's visit was #: 14 ?Starting weight: 186 lbs ?Starting date: 01/07/2020 ?Today's weight: 162 lbs ?Today's date: 02/07/2022 ?Total lbs lost to date: 24 lbs ?Total lbs lost since last in-office visit: 2 lbs ? ?Interim History: Sara Patel is down 2 lbs. She has struggled with hunger. She is getting adequate water.  ? ?Subjective:  ? ?1. Other fatigue ?Sara Patel will continue activities. Her RMR today was 1699. On 12/2019 it was 1472. ? ?2. SOB (shortness of breath) on exertion ?Sara Patel will continue activities. Her RMR today was 1699. On 12/2019 it was 1472. ? ?3. Insulin resistance ?Sara Patel is currently taking Metformin.  ? ?4. Vitamin D deficiency ?Sara Patel get minimal sun exposure.  ? ?Assessment/Plan:  ? ?1. Other fatigue ?Narjis will continue exercise. IR was reviewed. Sara Patel does feel that her weight is causing her energy to be lower than it should be. Fatigue may be related to obesity, depression or many other causes. Labs will be ordered, and in the meanwhile, Atha will focus on self care including making healthy food choices, increasing physical activity and focusing on stress reduction.  ? ?2. SOB (shortness of breath) on exertion ?Sara Patel will continue exercise. IR was reviewed. Sara Patel does feel that she gets out of breath more easily that she used to when she exercises. Sara Patel's shortness of breath appears to be obesity related and exercise induced. She has agreed to work on weight loss and gradually increase exercise to treat her exercise induced shortness of breath. Will continue to  monitor closely. ? ? ?3. Insulin resistance ?Sara Patel will continue taking Metformin 500 mg for 3 months with no refills. She will continue to work on weight loss, exercise, and decreasing simple carbohydrates to help decrease the risk of diabetes. Sara Patel agreed to follow-up with [place] as directed to closely monitor her progress. ? ?- metFORMIN (GLUCOPHAGE) 500 MG tablet; Take 1 tablet (500 mg total) by mouth 2 (two) times daily with a meal.  Dispense: 180 tablet; Refill: 0 ? ?4. Vitamin D deficiency ?Low Vitamin D level contributes to fatigue and are associated with obesity, breast, and colon cancer. We will refill prescription Vitamin D 50,000 IU every week for 1 month with no refills and Sara Patel will follow-up for routine testing of Vitamin D, at least 2-3 times per year to avoid over-replacement. ? ?- Vitamin D, Ergocalciferol, (DRISDOL) 1.25 MG (50000 UNIT) CAPS capsule; Take 1 capsule (50,000 Units total) by mouth every 7 (seven) days.  Dispense: 4 capsule; Refill: 0 ? ?5. Obesity, current BMI 28.8 ?Sara Patel is currently in the action stage of change. As such, her goal is to continue with weight loss efforts. She has agreed to the Category 2 Plan.  ? ?Sara Patel will adhere closely to her plan at least 80-90%. She will start protein shakes. ? ?Exercise goals:  As is. ? ?Behavioral modification strategies: increasing lean protein intake, decreasing simple carbohydrates, increasing vegetables, increasing water intake, decreasing eating out, no skipping meals, meal planning and cooking strategies, keeping healthy foods in the home, and planning for success. ? ?Sara Patel has agreed to follow-up with our clinic in  4 weeks. She was informed of the importance of frequent follow-up visits to maximize her success with intensive lifestyle modifications for her multiple health conditions.  ? ?Sara Patel was informed we would discuss her lab results at her next visit unless there is a critical issue that needs to be  addressed sooner. Sara Patel agreed to keep her next visit at the agreed upon time to discuss these results. ? ?Objective:  ? ?Blood pressure 111/78, pulse 83, temperature 98.1 ?F (36.7 ?C), height 5' 3"  (1.6 m), weight 162 lb (73.5 kg), SpO2 100 %. ?Body mass index is 28.7 kg/m?. ? ?General: Cooperative, alert, well developed, in no acute distress. ?HEENT: Conjunctivae and lids unremarkable. ?Cardiovascular: Regular rhythm.  ?Lungs: Normal work of breathing. ?Neurologic: No focal deficits.  ? ?Lab Results  ?Component Value Date  ? CREATININE 0.94 04/28/2021  ? BUN 11 04/28/2021  ? NA 140 04/28/2021  ? K 4.1 04/28/2021  ? CL 109 (H) 04/28/2021  ? CO2 19 (L) 04/28/2021  ? ?Lab Results  ?Component Value Date  ? ALT 22 04/28/2021  ? AST 18 04/28/2021  ? ALKPHOS 77 04/28/2021  ? BILITOT <0.2 04/28/2021  ? ?Lab Results  ?Component Value Date  ? HGBA1C 5.5 04/28/2021  ? HGBA1C 5.4 05/12/2020  ? HGBA1C 5.5 01/07/2020  ? HGBA1C 5.7 01/18/2016  ? ?Lab Results  ?Component Value Date  ? INSULIN 6.1 04/28/2021  ? INSULIN 6.1 05/12/2020  ? INSULIN 8.1 01/07/2020  ? ?Lab Results  ?Component Value Date  ? TSH 0.588 01/07/2020  ? ?Lab Results  ?Component Value Date  ? CHOL 119 05/12/2020  ? HDL 53 05/12/2020  ? [place] 50 05/12/2020  ? TRIG 83 05/12/2020  ? CHOLHDL 2.5 03/08/2013  ? ?Lab Results  ?Component Value Date  ? VD25OH 27.0 (L) 04/28/2021  ? VD25OH 27.3 (L) 05/12/2020  ? VD25OH 24.5 (L) 01/07/2020  ? ?Lab Results  ?Component Value Date  ? WBC 5.3 05/12/2020  ? HGB 11.2 05/12/2020  ? HCT 36.6 05/12/2020  ? MCV 86 05/12/2020  ? PLT 249 05/12/2020  ? ?Lab Results  ?Component Value Date  ? IRON 57 01/07/2020  ? TIBC 267 01/07/2020  ? FERRITIN 272 (H) 01/07/2020  ? ?Attestation Statements:  ? ?Reviewed by clinician on day of visit: allergies, medications, problem list, medical history, surgical history, family history, social history, and previous encounter notes. ? ?I, Lizbeth Bark, RMA, am acting as transcriptionist for [employer], DO. ? ?I have reviewed the above documentation for accuracy and completeness, and I agree with the above. Jearld Lesch, DO ? ?

## 2022-03-21 ENCOUNTER — Ambulatory Visit (INDEPENDENT_AMBULATORY_CARE_PROVIDER_SITE_OTHER): Payer: BC Managed Care – PPO | Admitting: Bariatrics

## 2022-03-23 NOTE — Progress Notes (Signed)
?TeleHealth Visit:  ?This visit was completed with telemedicine (audio/video) technology. ?Sara Patel has verbally consented to this TeleHealth visit. The patient is located at home, the provider is located at home. The participants in this visit include the listed provider and patient. The visit was conducted today via MyChart video. ? ?OBESITY ?Sara Patel is here to discuss her progress with her obesity treatment plan along with follow-up of her obesity related diagnoses.  ? ?Today's visit was # 15 ?Starting weight: 186 lbs ?Starting date: 01/07/2020 ?Weight at last in office visit: 162 lbs on 02/07/22 ?Total weight loss: 24 lbs at last in office visit on 02/07/22. ?Today's reported weight:  No weight reported. ? ?Nutrition Plan: the Category 2 Plan.  ?Hunger is moderately controlled. Cravings are poorly controlled in afternoon.  ?Current exercise: walking and weightlifting 3 x week.  ? ?Interim History: Sara Patel feels her weight is stuck at around 165 pounds.  Her goal weight is 145 to 155 pounds.  IC was repeated at last visit and RMR is 1699.  The category 2 plan is appropriate for her.  Hunger in late a.m. and late afternoon.  She generally has a 6 and half hour gap between breakfast and lunch.  ?Water intake is good-about 90 ounces per day. ?She feels that at least a few days per week she is doing some emotional eating in the evenings. ? ?She lives near [facility] so she would definitely like to alternate virtual visits with in person visits. ? ?Assessment/Plan:  ?1. Insulin Resistance ?Sara Patel has had elevated fasting insulin readings. Goal is HgbA1c < 5.7, fasting insulin closer to 5.   ?She reports polyphagia. ?Medication(s): Metformin 500 mg twice daily with meals. ?Lab Results  ?Component Value Date  ? HGBA1C 5.5 04/28/2021  ? ?Lab Results  ?Component Value Date  ? INSULIN 6.1 04/28/2021  ? INSULIN 6.1 05/12/2020  ? INSULIN 8.1 01/07/2020  ? ? ?Plan ?Continue metformin 500 mg with meals  twice daily. ?I considered a GLP-1 for her but she has history of pancreatitis.  She also has Crohn's disease. ? ?Vitamin D Deficiency ?Vitamin D is not at goal of 50. She is on weekly prescription Vitamin D 50,000 IU.  ?Lab Results  ?Component Value Date  ? VD25OH 27.0 (L) 04/28/2021  ? VD25OH 27.3 (L) 05/12/2020  ? VD25OH 24.5 (L) 01/07/2020  ? ? ?Plan: ?Refill prescription vitamin D 50,000 IU weekly. ? ?3.  Eating disorder/emotional eating ?She feels that she tends to eat due to emotions.  This happens at least 1-2 times per week.  She is on bupropion 100 mg twice daily, prescribed by her PCP for anxiety.  She does feel this has helped some up with food cravings. ? ?Plan: ?She would like to follow-up with Dr. Mallie Mussel.  I will check with Dr. Mallie Mussel to see if she needs to complete new patient paperwork since she has not had a visit with her since March 2021.   ?Increase dose of bupropion to 150 mg twice daily. ? ?4. Obesity: Current BMI 28.7 ?Sara Patel is currently in the action stage of change. As such, her goal is to continue with weight loss efforts.  ?She has agreed to keeping a food journal and adhering to recommended goals of 1100-1300 calories and 80 gms protein.  ? ?Information sent via MyChart about journaling. ? ?Exercise goals: Continue current exercise regimen. ? ?Behavioral modification strategies: increasing lean protein intake, decreasing simple carbohydrates, and emotional eating strategies. ? ?Sara Patel has agreed to follow-up with our  clinic in 3 weeks.  ? ?No orders of the defined types were placed in this encounter. ? ? ?Medications Discontinued During This Encounter  ?Medication Reason  ? metFORMIN (GLUCOPHAGE) 500 MG tablet Reorder  ? Vitamin D, Ergocalciferol, (DRISDOL) 1.25 MG (50000 UNIT) CAPS capsule Reorder  ? buPROPion (WELLBUTRIN) 100 MG tablet Dose change  ?  ? ?Meds ordered this encounter  ?Medications  ? metFORMIN (GLUCOPHAGE) 500 MG tablet  ?  Sig: Take 1 tablet (500 mg total) by  mouth 2 (two) times daily with a meal.  ?  Dispense:  180 tablet  ?  Refill:  0  ?  Order Specific Question:   Supervising Provider  ?  Answer:   Dennard Nip D [[id number]]  ? Vitamin D, Ergocalciferol, (DRISDOL) 1.25 MG (50000 UNIT) CAPS capsule  ?  Sig: Take 1 capsule (50,000 Units total) by mouth every 7 (seven) days.  ?  Dispense:  4 capsule  ?  Refill:  0  ?  Order Specific Question:   Supervising Provider  ?  Answer:   Dennard Nip D [[id number]]  ? buPROPion (WELLBUTRIN SR) 150 MG 12 hr tablet  ?  Sig: Take 1 tablet (150 mg total) by mouth 2 (two) times daily.  ?  Dispense:  60 tablet  ?  Refill:  0  ?  Order Specific Question:   Supervising Provider  ?  Answer:   Dennard Nip D [[id number]]  ?   ? ?Objective:  ? ?VITALS: Per patient if applicable, see vitals. ?GENERAL: Alert and in no acute distress. ?CARDIOPULMONARY: No increased WOB. Speaking in clear sentences.  ?PSYCH: Pleasant and cooperative. Speech normal rate and rhythm. Affect is appropriate. Insight and judgement are appropriate. Attention is focused, linear, and appropriate.  ?NEURO: Oriented as arrived to appointment on time with no prompting.  ? ?Lab Results  ?Component Value Date  ? CREATININE 0.94 04/28/2021  ? BUN 11 04/28/2021  ? NA 140 04/28/2021  ? K 4.1 04/28/2021  ? CL 109 (H) 04/28/2021  ? CO2 19 (L) 04/28/2021  ? ?Lab Results  ?Component Value Date  ? ALT 22 04/28/2021  ? AST 18 04/28/2021  ? ALKPHOS 77 04/28/2021  ? BILITOT <0.2 04/28/2021  ? ?Lab Results  ?Component Value Date  ? HGBA1C 5.5 04/28/2021  ? HGBA1C 5.4 05/12/2020  ? HGBA1C 5.5 01/07/2020  ? HGBA1C 5.7 01/18/2016  ? ?Lab Results  ?Component Value Date  ? INSULIN 6.1 04/28/2021  ? INSULIN 6.1 05/12/2020  ? INSULIN 8.1 01/07/2020  ? ?Lab Results  ?Component Value Date  ? TSH 0.588 01/07/2020  ? ?Lab Results  ?Component Value Date  ? CHOL 119 05/12/2020  ? HDL 53 05/12/2020  ? [place] 50 05/12/2020  ? TRIG 83 05/12/2020  ? CHOLHDL 2.5 03/08/2013  ? ?Lab Results  ?Component Value  Date  ? WBC 5.3 05/12/2020  ? HGB 11.2 05/12/2020  ? HCT 36.6 05/12/2020  ? MCV 86 05/12/2020  ? PLT 249 05/12/2020  ? ?Lab Results  ?Component Value Date  ? IRON 57 01/07/2020  ? TIBC 267 01/07/2020  ? FERRITIN 272 (H) 01/07/2020  ? ?Lab Results  ?Component Value Date  ? VD25OH 27.0 (L) 04/28/2021  ? VD25OH 27.3 (L) 05/12/2020  ? VD25OH 24.5 (L) 01/07/2020  ? ? ?Attestation Statements:  ? ?Reviewed by clinician on day of visit: allergies, medications, problem list, medical history, surgical history, family history, social history, and previous encounter notes. ? ? ?

## 2022-03-24 ENCOUNTER — Encounter (INDEPENDENT_AMBULATORY_CARE_PROVIDER_SITE_OTHER): Payer: Self-pay | Admitting: Family Medicine

## 2022-03-24 ENCOUNTER — Telehealth (INDEPENDENT_AMBULATORY_CARE_PROVIDER_SITE_OTHER): Payer: BC Managed Care – PPO | Admitting: Family Medicine

## 2022-03-24 DIAGNOSIS — E8881 Metabolic syndrome: Secondary | ICD-10-CM

## 2022-03-24 DIAGNOSIS — E559 Vitamin D deficiency, unspecified: Secondary | ICD-10-CM

## 2022-03-24 DIAGNOSIS — F509 Eating disorder, unspecified: Secondary | ICD-10-CM | POA: Insufficient documentation

## 2022-03-24 DIAGNOSIS — E669 Obesity, unspecified: Secondary | ICD-10-CM | POA: Diagnosis not present

## 2022-03-24 DIAGNOSIS — E88819 Insulin resistance, unspecified: Secondary | ICD-10-CM

## 2022-03-24 DIAGNOSIS — Z6828 Body mass index (BMI) 28.0-28.9, adult: Secondary | ICD-10-CM

## 2022-03-24 DIAGNOSIS — D508 Other iron deficiency anemias: Secondary | ICD-10-CM

## 2022-03-24 MED ORDER — METFORMIN HCL 500 MG PO TABS: 500.0000 mg | ORAL_TABLET | Freq: Two times a day (BID) | ORAL | 0 refills | Status: DC

## 2022-03-24 MED ORDER — VITAMIN D (ERGOCALCIFEROL) 1.25 MG (50000 UNIT) PO CAPS: 50000.0000 [IU] | ORAL_CAPSULE | ORAL | 0 refills | Status: DC

## 2022-03-24 MED ORDER — BUPROPION HCL ER (SR) 150 MG PO TB12
150.0000 mg | ORAL_TABLET | Freq: Two times a day (BID) | ORAL | 0 refills | Status: DC
Start: 2022-03-24 — End: 2022-04-21

## 2022-03-28 ENCOUNTER — Encounter (INDEPENDENT_AMBULATORY_CARE_PROVIDER_SITE_OTHER): Payer: Self-pay | Admitting: Family Medicine

## 2022-03-28 DIAGNOSIS — F509 Eating disorder, unspecified: Secondary | ICD-10-CM

## 2022-04-12 NOTE — Progress Notes (Signed)
TeleHealth Visit:  This visit was completed with telemedicine (audio/video) technology. Sara Patel has verbally consented to this TeleHealth visit. The patient is located at home, the provider is located at home. The participants in this visit include the listed provider and patient. The visit was conducted today via MyChart video.  OBESITY Sara Patel is here to discuss her progress with her obesity treatment plan along with follow-up of her obesity related diagnoses.   Today's visit was # 16 Starting weight: 186 lbs Starting date: 01/07/2020 Weight at last in office visit: 162 lbs on 02/07/22 Total weight loss: 24 lbs at last in office visit on 02/07/22. Today's reported weight: 161 lbs No weight reported.  Nutrition Plan: keeping a food journal and adhering to recommended goals of 1100-1300 calories and 80 gms protein.  Hunger is well controlled. Cravings are well controlled.  Current exercise: walking and weightlifting 3 x week  Interim History: Sara Patel has been journaling as discussed at last office visit using the Lose It! App.  She said it was "eye-opening" because it really made her aware of all of the calories that she took in.  She was able to meet her protein and calorie goals most days.  Occasionally she exceeded the calorie goals by 100 (1400 cal/day).  She found it easy to meet the protein goals.  She also found journaling fairly easy and journaled her food ahead of time sometimes. Feels the hardest thing in her weight loss journey has been patient's and maintaining motivation because it has been such a slow process.  She tries to concentrate on things other than the scale to keep her motivated such as her increased energy level, the way her clothes fit, and that she feels better overall.  Assessment/Plan:  1.  Eating disorder/emotional eating We increased her dose of bupropion at last office visit to 150 mg twice daily.  She feels this has helped with appetite and  cravings.  She had some sleep issues the first week but this resolved.  Plan: Continue bupropion 150 mg twice daily. She will complete new patient paperwork next office visit and set up an appointment with Dr. Mallie Mussel.  2. Insulin Resistance A1C at goal.  Fasting insulin nearly within normal limits-less than 5. Medication(s): Metformin 500 mg twice daily with meals Lab Results  Component Value Date   HGBA1C 5.5 04/28/2021   Lab Results  Component Value Date   INSULIN 6.1 04/28/2021   INSULIN 6.1 05/12/2020   INSULIN 8.1 01/07/2020    Plan Continue metformin 500 mg twice daily. Check labs next office visit.   3. Obesity: Current BMI 28.7 Sara Patel is currently in the action stage of change. As such, her goal is to continue with weight loss efforts.  She has agreed to keeping a food journal and adhering to recommended goals of 1100-1300 calories and 80 gms protein. [name]   Exercise goals: as is.  Behavioral modification strategies: increasing lean protein intake and planning for success.  Sara Patel has agreed to follow-up with our clinic in 4 weeks.   No orders of the defined types were placed in this encounter.   There are no discontinued medications.   No orders of the defined types were placed in this encounter.     Objective:   VITALS: Per patient if applicable, see vitals. GENERAL: Alert and in no acute distress. CARDIOPULMONARY: No increased WOB. Speaking in clear sentences.  PSYCH: Pleasant and cooperative. Speech normal rate and rhythm. Affect is appropriate. Insight and judgement are appropriate. Attention  is focused, linear, and appropriate.  NEURO: Oriented as arrived to appointment on time with no prompting.   Lab Results  Component Value Date   CREATININE 0.94 04/28/2021   BUN 11 04/28/2021   NA 140 04/28/2021   K 4.1 04/28/2021   CL 109 (H) 04/28/2021   CO2 19 (L) 04/28/2021   Lab Results  Component Value Date   ALT 22 04/28/2021   AST 18  04/28/2021   ALKPHOS 77 04/28/2021   BILITOT <0.2 04/28/2021   Lab Results  Component Value Date   HGBA1C 5.5 04/28/2021   HGBA1C 5.4 05/12/2020   HGBA1C 5.5 01/07/2020   HGBA1C 5.7 01/18/2016   Lab Results  Component Value Date   INSULIN 6.1 04/28/2021   INSULIN 6.1 05/12/2020   INSULIN 8.1 01/07/2020   Lab Results  Component Value Date   TSH 0.588 01/07/2020   Lab Results  Component Value Date   CHOL 119 05/12/2020   HDL 53 05/12/2020   LDLCALC 50 05/12/2020   TRIG 83 05/12/2020   CHOLHDL 2.5 03/08/2013   Lab Results  Component Value Date   WBC 5.3 05/12/2020   HGB 11.2 05/12/2020   HCT 36.6 05/12/2020   MCV 86 05/12/2020   PLT 249 05/12/2020   Lab Results  Component Value Date   IRON 57 01/07/2020   TIBC 267 01/07/2020   FERRITIN 272 (H) 01/07/2020   Lab Results  Component Value Date   VD25OH 27.0 (L) 04/28/2021   VD25OH 27.3 (L) 05/12/2020   VD25OH 24.5 (L) 01/07/2020    Attestation Statements:   Reviewed by clinician on day of visit: allergies, medications, problem list, medical history, surgical history, family history, social history, and previous encounter notes.  Time spent on visit including pre-visit chart review and post-visit charting and care was 31 minutes.

## 2022-04-13 ENCOUNTER — Telehealth (INDEPENDENT_AMBULATORY_CARE_PROVIDER_SITE_OTHER): Payer: BC Managed Care – PPO | Admitting: Family Medicine

## 2022-04-13 ENCOUNTER — Encounter (INDEPENDENT_AMBULATORY_CARE_PROVIDER_SITE_OTHER): Payer: Self-pay | Admitting: Family Medicine

## 2022-04-13 DIAGNOSIS — Z6828 Body mass index (BMI) 28.0-28.9, adult: Secondary | ICD-10-CM

## 2022-04-13 DIAGNOSIS — E669 Obesity, unspecified: Secondary | ICD-10-CM

## 2022-04-13 DIAGNOSIS — E8881 Metabolic syndrome: Secondary | ICD-10-CM

## 2022-04-13 DIAGNOSIS — F509 Eating disorder, unspecified: Secondary | ICD-10-CM | POA: Diagnosis not present

## 2022-04-13 DIAGNOSIS — E88819 Insulin resistance, unspecified: Secondary | ICD-10-CM

## 2022-04-15 ENCOUNTER — Other Ambulatory Visit (INDEPENDENT_AMBULATORY_CARE_PROVIDER_SITE_OTHER): Payer: Self-pay | Admitting: Family Medicine

## 2022-04-15 DIAGNOSIS — F509 Eating disorder, unspecified: Secondary | ICD-10-CM

## 2022-04-21 ENCOUNTER — Other Ambulatory Visit (INDEPENDENT_AMBULATORY_CARE_PROVIDER_SITE_OTHER): Payer: Self-pay | Admitting: Family Medicine

## 2022-04-21 DIAGNOSIS — F509 Eating disorder, unspecified: Secondary | ICD-10-CM

## 2022-04-21 MED ORDER — BUPROPION HCL ER (SR) 150 MG PO TB12: 150.0000 mg | ORAL_TABLET | Freq: Two times a day (BID) | ORAL | 0 refills | Status: DC

## 2022-04-21 NOTE — Telephone Encounter (Signed)
LAST APPOINTMENT DATE: 04/13/22 NEXT APPOINTMENT DATE: 05/09/22   [id number] [place] [id number]: [phone]: [phone] Patient is requesting a refill of the following medications: No prescriptions requested or ordered in this encounter   Date last filled: 03/24/22 [facility] Lab Results      Component                Value               Date                      HGBA1C                   5.5                 04/28/2021                HGBA1C                   5.4                 05/12/2020                HGBA1C                   5.5                 01/07/2020           Lab Results      Component                Value               Date                      LDLCALC                  50                  05/12/2020                CREATININE               0.94                04/28/2021           Lab Results      Component                Value               Date                      VD25OH                   27.0 (L)            04/28/2021                VD25OH                   27.3 (L)            05/12/2020                VD25OH  24.5 (L)            01/07/2020            BP Readings from Last 3 Encounters: 02/07/22 : 111/78 01/10/22 : 111/75 07/29/21 : 103/68

## 2022-05-08 ENCOUNTER — Encounter (INDEPENDENT_AMBULATORY_CARE_PROVIDER_SITE_OTHER): Payer: Self-pay | Admitting: Nurse Practitioner

## 2022-05-09 ENCOUNTER — Encounter (INDEPENDENT_AMBULATORY_CARE_PROVIDER_SITE_OTHER): Payer: Self-pay

## 2022-05-09 ENCOUNTER — Encounter (INDEPENDENT_AMBULATORY_CARE_PROVIDER_SITE_OTHER): Payer: Self-pay | Admitting: Nurse Practitioner

## 2022-05-09 ENCOUNTER — Telehealth (INDEPENDENT_AMBULATORY_CARE_PROVIDER_SITE_OTHER): Payer: BC Managed Care – PPO | Admitting: Nurse Practitioner

## 2022-05-09 DIAGNOSIS — Z6828 Body mass index (BMI) 28.0-28.9, adult: Secondary | ICD-10-CM

## 2022-05-09 DIAGNOSIS — E559 Vitamin D deficiency, unspecified: Secondary | ICD-10-CM

## 2022-05-09 DIAGNOSIS — E669 Obesity, unspecified: Secondary | ICD-10-CM | POA: Diagnosis not present

## 2022-05-09 MED ORDER — VITAMIN D (ERGOCALCIFEROL) 1.25 MG (50000 UNIT) PO CAPS: 50000.0000 [IU] | ORAL_CAPSULE | ORAL | 0 refills | Status: DC

## 2022-05-09 NOTE — Telephone Encounter (Signed)
Call to patient, changed to my chart visit.

## 2022-05-10 NOTE — Progress Notes (Signed)
TeleHealth Visit:  Due to the COVID-19 pandemic, this visit was completed with telemedicine (audio/video) technology to reduce patient and provider exposure as well as to preserve personal protective equipment.   Sara Patel has verbally consented to this TeleHealth visit. The patient is located at home, the provider is located at the [facility] office. The participants in this visit include the listed provider and patient. The visit was conducted today via MyChart Video.  Sara Patel was unable to use realtime audiovisual technology today and the telehealth visit was conducted via telephone.  Chief Complaint: OBESITY Sara Patel is here to discuss her progress with her obesity treatment plan along with follow-up of her obesity related diagnoses. Sara Patel is on the Category 2 Plan and states she is following her eating plan approximately 90% of the time. Sara Patel states she is strength training and walking 30 minutes 3-4 times per week.  Today's visit was #: 17 Starting weight: 186 lbs Starting date: 01/07/2020  Interim History: Sara Patel currently has the flu and strep-throat. Weight at home 158 lbs. She is only able to eat crackers, soup and hot tea at this time. Drinking some water, she was doing well until she got sick. She was journaling in the Lose It app prior to getting sick and was averaging 1250 calories and 90 grams of protein. She feels journaling is very useful. Her highest weight was 190 lbs after pregnancy.  Subjective:   1. Vitamin D deficiency Sara Patel is currently taking prescription Vit D 50,000 IU once a week. Denies any nausea, vomiting or muscle weakness.   Assessment/Plan:   1. Vitamin D deficiency We will refill Vit D 50,000 IU once a week for 1 month with 0 refills. Side effects discussed.   -Refill Vitamin D, Ergocalciferol, (DRISDOL) 1.25 MG (50000 UNIT) CAPS capsule; Take 1 capsule (50,000 Units total) by mouth every 7 (seven) days.  Dispense:  4 capsule; Refill: 0  Low Vitamin D level contributes to fatigue and are associated with obesity, breast, and colon cancer. She agrees to continue to take prescription Vitamin D @50 ,000 IU every week and will follow-up for routine testing of Vitamin D, at least 2-3 times per year to avoid over-replacement.   2. Obesity: Current BMI 28.7 Sara Patel is currently in the action stage of change. As such, her goal is to continue with weight loss efforts. She has agreed to the Category 2 Plan and keeping a food journal and adhering to recommended goals of 1100-1300 calories and 80 grams of  protein.   Exercise goals: As is.   Behavioral modification strategies: increasing lean protein intake, increasing water intake, and planning for success.  Sara Patel has agreed to follow-up with our clinic in 3 weeks. She was informed of the importance of frequent follow-up visits to maximize her success with intensive lifestyle modifications for her multiple health conditions.  Objective:   VITALS: Per patient if applicable, see vitals. GENERAL: Alert and in no acute distress. CARDIOPULMONARY: No increased WOB. Speaking in clear sentences.  PSYCH: Pleasant and cooperative. Speech normal rate and rhythm. Affect is appropriate. Insight and judgement are appropriate. Attention is focused, linear, and appropriate.  NEURO: Oriented as arrived to appointment on time with no prompting.   Lab Results  Component Value Date   CREATININE 0.94 04/28/2021   BUN 11 04/28/2021   NA 140 04/28/2021   K 4.1 04/28/2021   CL 109 (H) 04/28/2021   CO2 19 (L) 04/28/2021   Lab Results  Component Value Date  ALT 22 04/28/2021   AST 18 04/28/2021   ALKPHOS 77 04/28/2021   BILITOT <0.2 04/28/2021   Lab Results  Component Value Date   HGBA1C 5.5 04/28/2021   HGBA1C 5.4 05/12/2020   HGBA1C 5.5 01/07/2020   HGBA1C 5.7 01/18/2016   Lab Results  Component Value Date   INSULIN 6.1 04/28/2021   INSULIN 6.1 05/12/2020    INSULIN 8.1 01/07/2020   Lab Results  Component Value Date   TSH 0.588 01/07/2020   Lab Results  Component Value Date   CHOL 119 05/12/2020   HDL 53 05/12/2020   LDLCALC 50 05/12/2020   TRIG 83 05/12/2020   CHOLHDL 2.5 03/08/2013   Lab Results  Component Value Date   VD25OH 27.0 (L) 04/28/2021   VD25OH 27.3 (L) 05/12/2020   VD25OH 24.5 (L) 01/07/2020   Lab Results  Component Value Date   WBC 5.3 05/12/2020   HGB 11.2 05/12/2020   HCT 36.6 05/12/2020   MCV 86 05/12/2020   PLT 249 05/12/2020   Lab Results  Component Value Date   IRON 57 01/07/2020   TIBC 267 01/07/2020   FERRITIN 272 (H) 01/07/2020    Attestation Statements:   Reviewed by clinician on day of visit: allergies, medications, problem list, medical history, surgical history, family history, social history, and previous encounter notes.  Time spent on visit including pre-visit chart review and post-visit charting and care was 30 minutes.   I, Brendell Tyus, RMA, am acting as transcriptionist for Everardo Pacific, FNP..  I have reviewed the above documentation for accuracy and completeness, and I agree with the above. Everardo Pacific, FNP

## 2022-05-14 ENCOUNTER — Other Ambulatory Visit (INDEPENDENT_AMBULATORY_CARE_PROVIDER_SITE_OTHER): Payer: Self-pay | Admitting: Family Medicine

## 2022-05-14 DIAGNOSIS — F509 Eating disorder, unspecified: Secondary | ICD-10-CM

## 2022-05-17 ENCOUNTER — Other Ambulatory Visit (INDEPENDENT_AMBULATORY_CARE_PROVIDER_SITE_OTHER): Payer: Self-pay | Admitting: Family Medicine

## 2022-05-17 DIAGNOSIS — F509 Eating disorder, unspecified: Secondary | ICD-10-CM

## 2022-05-18 ENCOUNTER — Other Ambulatory Visit (INDEPENDENT_AMBULATORY_CARE_PROVIDER_SITE_OTHER): Payer: Self-pay | Admitting: Family Medicine

## 2022-05-18 DIAGNOSIS — F509 Eating disorder, unspecified: Secondary | ICD-10-CM

## 2022-05-25 ENCOUNTER — Encounter (INDEPENDENT_AMBULATORY_CARE_PROVIDER_SITE_OTHER): Payer: Self-pay | Admitting: Family Medicine

## 2022-05-26 ENCOUNTER — Other Ambulatory Visit (INDEPENDENT_AMBULATORY_CARE_PROVIDER_SITE_OTHER): Payer: Self-pay | Admitting: Nurse Practitioner

## 2022-05-26 DIAGNOSIS — F509 Eating disorder, unspecified: Secondary | ICD-10-CM

## 2022-05-26 MED ORDER — BUPROPION HCL ER (SR) 150 MG PO TB12: 150.0000 mg | ORAL_TABLET | Freq: Two times a day (BID) | ORAL | 0 refills | Status: DC

## 2022-05-26 NOTE — Telephone Encounter (Signed)
LAST APPOINTMENT DATE: 05/09/22 w/ Colletta [place] NEXT APPOINTMENT DATE: 06/02/22   [id number] [place] [id number] [id number]: [phone]: [phone] Patient is requesting a refill of the following medications: No prescriptions requested or ordered in this encounter   Date last filled: 04/21/22 [facility] Lab Results      Component                Value               Date                      HGBA1C                   5.5                 04/28/2021                HGBA1C                   5.4                 05/12/2020                HGBA1C                   5.5                 01/07/2020           Lab Results      Component                Value               Date                      LDLCALC                  50                  05/12/2020                CREATININE               0.94                04/28/2021           Lab Results      Component                Value               Date                      VD25OH                   27.0 (L)            04/28/2021                VD25OH                   27.3 (L)            05/12/2020                VD25OH  24.5 (L)            01/07/2020            BP Readings from Last 3 Encounters: 02/07/22 : 111/78 01/10/22 : 111/75 07/29/21 : 103/68

## 2022-06-01 NOTE — Progress Notes (Signed)
TeleHealth Visit:  This visit was completed with telemedicine (audio/video) technology. [name] has verbally consented to this TeleHealth visit. The patient is located at home, the provider is located at home. The participants in this visit include the listed provider and patient. The visit was conducted today via MyChart video.  OBESITY Sara Patel is here to discuss her progress with her obesity treatment plan along with follow-up of her obesity related diagnoses.   Today's visit was # 18 Starting weight: 186 lbs Starting date: 01/07/2020 Weight at last in office visit: 162 lbs on 02/07/22 Total weight loss: 24 lbs at last in office visit on 02/07/22. Today's reported weight: 157 lbs  Nutrition Plan: the Category 2 Plan and keeping a food journal and adhering to recommended goals of 1100-1300 calories and 80 gms protein.  Hunger is well controlled.  Current exercise: strength training and walking 30 minutes 3-4 times per week.  Interim History: Sara Patel reports a weight of 157 pounds today which puts her nearly at her weight goal (145-155 pounds, 25-27 BMI). She is doing very well with plan adherence despite having extra stress this month-daycare is closed and she is keeping her 38-year-old daughter at home while she works.  This has led to some stress eating.  However she is mostly keeping her calories between 1250 and 1350 and averaging 90 g of protein per day.  Assessment/Plan:  1.  Eating disorder/emotional eating Notes some stress eating due to keeping daughter at home while working the month of July.  She is still doing a good job with keeping her calories fairly close to her goal. She is on bupropion 150 mg twice daily and mood is stable.  Plan: Discussed low calorie options for snacking.  Also discussed ideas for reducing stress eating-sugar-free gum, tea or coffee, raw vegetables. Refill bupropion 150 mg twice daily.  2. Insulin Resistance A1c normal at 5.5.  She has  very mild insulin resistance and last fasting insulin was 6.1 in June 2022. She denies polyphagia. Medication(s): Metformin 500 mg once daily. Lab Results  Component Value Date   HGBA1C 5.5 04/28/2021   Lab Results  Component Value Date   INSULIN 6.1 04/28/2021   INSULIN 6.1 05/12/2020   INSULIN 8.1 01/07/2020    Plan Refill metformin 500 mg once daily.   3. Obesity: Current BMI 27.8 [name] is currently in the action stage of change. As such, her goal is to continue with weight loss efforts.  She has agreed to the Category 2 Plan and keeping a food journal and adhering to recommended goals of 1100-1300 calories and 80 gms protein. [name]   Exercise goals: as is  Behavioral modification strategies: emotional eating strategies and planning for success.  [name] has agreed to follow-up with our clinic in 4 weeks.   No orders of the defined types were placed in this encounter.   Medications Discontinued During This Encounter  Medication Reason   metFORMIN (GLUCOPHAGE) 500 MG tablet Reorder   buPROPion (WELLBUTRIN SR) 150 MG 12 hr tablet Reorder     Meds ordered this encounter  Medications   buPROPion (WELLBUTRIN SR) 150 MG 12 hr tablet    Sig: Take 1 tablet (150 mg total) by mouth 2 (two) times daily.    Dispense:  180 tablet    Refill:  0    Order Specific Question:   Supervising Provider    Answer:   Dennard Nip D [[id number]]   metFORMIN (GLUCOPHAGE) 500 MG tablet    Sig: Take 1  tablet (500 mg total) by mouth 2 (two) times daily with a meal.    Dispense:  180 tablet    Refill:  0    Order Specific Question:   Supervising Provider    Answer:   Dennard Nip D [[id number]]      Objective:   VITALS: Per patient if applicable, see vitals. GENERAL: Alert and in no acute distress. CARDIOPULMONARY: No increased WOB. Speaking in clear sentences.  PSYCH: Pleasant and cooperative. Speech normal rate and rhythm. Affect is appropriate. Insight and judgement are appropriate.  Attention is focused, linear, and appropriate.  NEURO: Oriented as arrived to appointment on time with no prompting.   Lab Results  Component Value Date   CREATININE 0.94 04/28/2021   BUN 11 04/28/2021   NA 140 04/28/2021   K 4.1 04/28/2021   CL 109 (H) 04/28/2021   CO2 19 (L) 04/28/2021   Lab Results  Component Value Date   ALT 22 04/28/2021   AST 18 04/28/2021   ALKPHOS 77 04/28/2021   BILITOT <0.2 04/28/2021   Lab Results  Component Value Date   HGBA1C 5.5 04/28/2021   HGBA1C 5.4 05/12/2020   HGBA1C 5.5 01/07/2020   HGBA1C 5.7 01/18/2016   Lab Results  Component Value Date   INSULIN 6.1 04/28/2021   INSULIN 6.1 05/12/2020   INSULIN 8.1 01/07/2020   Lab Results  Component Value Date   TSH 0.588 01/07/2020   Lab Results  Component Value Date   CHOL 119 05/12/2020   HDL 53 05/12/2020   LDLCALC 50 05/12/2020   TRIG 83 05/12/2020   CHOLHDL 2.5 03/08/2013   Lab Results  Component Value Date   WBC 5.3 05/12/2020   HGB 11.2 05/12/2020   HCT 36.6 05/12/2020   MCV 86 05/12/2020   PLT 249 05/12/2020   Lab Results  Component Value Date   IRON 57 01/07/2020   TIBC 267 01/07/2020   FERRITIN 272 (H) 01/07/2020   Lab Results  Component Value Date   VD25OH 27.0 (L) 04/28/2021   VD25OH 27.3 (L) 05/12/2020   VD25OH 24.5 (L) 01/07/2020    Attestation Statements:   Reviewed by clinician on day of visit: allergies, medications, problem list, medical history, surgical history, family history, social history, and previous encounter notes.

## 2022-06-02 ENCOUNTER — Telehealth (INDEPENDENT_AMBULATORY_CARE_PROVIDER_SITE_OTHER): Payer: BC Managed Care – PPO | Admitting: Family Medicine

## 2022-06-02 ENCOUNTER — Encounter (INDEPENDENT_AMBULATORY_CARE_PROVIDER_SITE_OTHER): Payer: Self-pay | Admitting: Family Medicine

## 2022-06-02 VITALS — Ht 63.0 in | Wt 157.0 lb

## 2022-06-02 DIAGNOSIS — F509 Eating disorder, unspecified: Secondary | ICD-10-CM | POA: Diagnosis not present

## 2022-06-02 DIAGNOSIS — Z6827 Body mass index (BMI) 27.0-27.9, adult: Secondary | ICD-10-CM | POA: Diagnosis not present

## 2022-06-02 DIAGNOSIS — E669 Obesity, unspecified: Secondary | ICD-10-CM | POA: Diagnosis not present

## 2022-06-02 DIAGNOSIS — E8881 Metabolic syndrome: Secondary | ICD-10-CM

## 2022-06-02 DIAGNOSIS — E88819 Insulin resistance, unspecified: Secondary | ICD-10-CM

## 2022-06-02 MED ORDER — BUPROPION HCL ER (SR) 150 MG PO TB12: 150.0000 mg | ORAL_TABLET | Freq: Two times a day (BID) | ORAL | 0 refills | Status: DC

## 2022-06-02 MED ORDER — METFORMIN HCL 500 MG PO TABS: 500.0000 mg | ORAL_TABLET | Freq: Two times a day (BID) | ORAL | 0 refills | Status: DC

## 2022-06-29 ENCOUNTER — Encounter (INDEPENDENT_AMBULATORY_CARE_PROVIDER_SITE_OTHER): Payer: Self-pay

## 2022-07-04 ENCOUNTER — Telehealth (INDEPENDENT_AMBULATORY_CARE_PROVIDER_SITE_OTHER): Payer: BC Managed Care – PPO | Admitting: Nurse Practitioner

## 2022-07-11 ENCOUNTER — Encounter (INDEPENDENT_AMBULATORY_CARE_PROVIDER_SITE_OTHER): Payer: Self-pay | Admitting: Nurse Practitioner

## 2022-07-11 ENCOUNTER — Ambulatory Visit (INDEPENDENT_AMBULATORY_CARE_PROVIDER_SITE_OTHER): Payer: BC Managed Care – PPO | Admitting: Nurse Practitioner

## 2022-07-11 VITALS — BP 110/73 | HR 68 | Temp 97.9°F | Ht 63.0 in | Wt 154.0 lb

## 2022-07-11 DIAGNOSIS — D649 Anemia, unspecified: Secondary | ICD-10-CM | POA: Diagnosis not present

## 2022-07-11 DIAGNOSIS — R5383 Other fatigue: Secondary | ICD-10-CM

## 2022-07-11 DIAGNOSIS — E8881 Metabolic syndrome: Secondary | ICD-10-CM

## 2022-07-11 DIAGNOSIS — E559 Vitamin D deficiency, unspecified: Secondary | ICD-10-CM

## 2022-07-11 DIAGNOSIS — Z6827 Body mass index (BMI) 27.0-27.9, adult: Secondary | ICD-10-CM

## 2022-07-11 DIAGNOSIS — E669 Obesity, unspecified: Secondary | ICD-10-CM

## 2022-07-11 DIAGNOSIS — Z13220 Encounter for screening for lipoid disorders: Secondary | ICD-10-CM

## 2022-07-11 MED ORDER — METFORMIN HCL 500 MG PO TABS: 500.0000 mg | ORAL_TABLET | Freq: Two times a day (BID) | ORAL | 0 refills | Status: DC

## 2022-07-12 LAB — COMPREHENSIVE METABOLIC PANEL
ALT: 22 IU/L (ref 0–32)
AST: 14 IU/L (ref 0–40)
Albumin/Globulin Ratio: 1 — ABNORMAL LOW (ref 1.2–2.2)
Albumin: 3.8 g/dL — ABNORMAL LOW (ref 3.9–4.9)
Alkaline Phosphatase: 86 IU/L (ref 44–121)
BUN/Creatinine Ratio: 10 (ref 9–23)
BUN: 10 mg/dL (ref 6–20)
Bilirubin Total: 0.2 mg/dL (ref 0.0–1.2)
CO2: 22 mmol/L (ref 20–29)
Calcium: 9.5 mg/dL (ref 8.7–10.2)
Chloride: 104 mmol/L (ref 96–106)
Creatinine, Ser: 1.01 mg/dL — ABNORMAL HIGH (ref 0.57–1.00)
Globulin, Total: 3.7 g/dL (ref 1.5–4.5)
Glucose: 91 mg/dL (ref 70–99)
Potassium: 3.9 mmol/L (ref 3.5–5.2)
Sodium: 139 mmol/L (ref 134–144)
Total Protein: 7.5 g/dL (ref 6.0–8.5)
eGFR: 73 mL/min/{1.73_m2} (ref >59)

## 2022-07-12 LAB — FERRITIN: Ferritin: 429 ng/mL — ABNORMAL HIGH (ref 15–150)

## 2022-07-12 LAB — CBC WITH DIFFERENTIAL/PLATELET
Basophils Absolute: 0 10*3/uL (ref 0.0–0.2)
Basos: 1 %
EOS (ABSOLUTE): 0.2 10*3/uL (ref 0.0–0.4)
Eos: 3 %
Hematocrit: 36.1 % (ref 34.0–46.6)
Hemoglobin: 11.5 g/dL (ref 11.1–15.9)
Immature Grans (Abs): 0 10*3/uL (ref 0.0–0.1)
Immature Granulocytes: 0 %
Lymphocytes Absolute: 1.9 10*3/uL (ref 0.7–3.1)
Lymphs: 32 %
MCH: 27.8 pg (ref 26.6–33.0)
MCHC: 31.9 g/dL (ref 31.5–35.7)
MCV: 87 fL (ref 79–97)
Monocytes Absolute: 0.4 10*3/uL (ref 0.1–0.9)
Monocytes: 7 %
Neutrophils Absolute: 3.4 10*3/uL (ref 1.4–7.0)
Neutrophils: 57 %
Platelets: 220 10*3/uL (ref 150–450)
RBC: 4.14 x10E6/uL (ref 3.77–5.28)
RDW: 14.8 % (ref 11.7–15.4)
WBC: 5.9 10*3/uL (ref 3.4–10.8)

## 2022-07-12 LAB — TSH: TSH: 0.814 u[IU]/mL (ref 0.450–4.500)

## 2022-07-12 LAB — IRON: Iron: 95 ug/dL (ref 27–159)

## 2022-07-12 LAB — INSULIN, RANDOM: INSULIN: 6.7 u[IU]/mL (ref 2.6–24.9)

## 2022-07-12 LAB — LIPID PANEL WITH LDL/HDL RATIO
Cholesterol, Total: 131 mg/dL (ref 100–199)
HDL: 71 mg/dL (ref >39)
LDL Chol Calc (NIH): 45 mg/dL (ref 0–99)
LDL/HDL Ratio: 0.6 ratio (ref 0.0–3.2)
Triglycerides: 79 mg/dL (ref 0–149)
VLDL Cholesterol Cal: 15 mg/dL (ref 5–40)

## 2022-07-12 LAB — VITAMIN B12: Vitamin B-12: 472 pg/mL (ref 232–1245)

## 2022-07-12 LAB — VITAMIN D 25 HYDROXY (VIT D DEFICIENCY, FRACTURES): Vit D, 25-Hydroxy: 28.2 ng/mL — ABNORMAL LOW (ref 30.0–100.0)

## 2022-07-12 LAB — HEMOGLOBIN A1C
Est. average glucose Bld gHb Est-mCnc: 114 mg/dL
Hgb A1c MFr Bld: 5.6 % (ref 4.8–5.6)

## 2022-07-19 NOTE — Progress Notes (Unsigned)
Chief Complaint:   OBESITY Sara Patel is here to discuss her progress with her obesity treatment plan along with follow-up of her obesity related diagnoses. [name] is on the Category 2 Plan and states she is following her eating plan approximately 85% of the time. [name] states she is walking and weight training 30 minutes 3 times per week.  Today's visit was #: 87 Starting weight: 186 lbs Starting date: 01/07/2020 Today's weight: 154 lbs Today's date: 07/11/2022 Total lbs lost to date: 32 lbs Total lbs lost since last in-office visit: 8  Interim History: [name] has overall done well with weight loss. She has met her goal weight. [name]:1250, protein:90-100 grams. Drinking water, coffee and Cola Zero. She is not skipping meals. Struggles with stress eating. Denies hunger. Some cravings. Goal weight: 145-149.  Subjective:   1. Insulin resistance Halimah is currently taking metformin 500 mg twice a day. Denies any side effects and polyphagia.  2. Anemia, unspecified type [name] is taking iron daily. She has seen hematology in the past. EGD 04/11/22. No reports menses not heavy. Has iron infusions 04/11/22. Does not have follow up appointment schedule with hematology.  3. Vitamin D deficiency [name] has been off Vit D since last visit. She is taking prenatal vitamin.  4. Other fatigue [name] has struggled with fatigue for years.  5. Screening for hyperlipidemia [name] has never been on medication. Family history : unknown.  Assessment/Plan:   1. Insulin resistance We will refill Metformin 500 mg twice a day for 3 months with no refills. She may decrease to once per day. We will discuss at next visit. We will obtain labs today.  -Refill metFORMIN (GLUCOPHAGE) 500 MG tablet; Take 1 tablet (500 mg total) by mouth 2 (two) times daily with a meal.  Dispense: 180 tablet; Refill: 0  - Comprehensive metabolic panel - Insulin, random - Hemoglobin A1c  2.  Anemia, unspecified type We will obtain labs today. Continue iron as directed. - Comprehensive metabolic panel - CBC with Differential/Platelet - Ferritin - Iron - Vitamin B12  3. Vitamin D deficiency We will obtain labs today.  - Comprehensive metabolic panel - VITAMIN D 25 Hydroxy (Vit-D Deficiency, Fractures)  4. Other fatigue We will obtain labs today.  - Comprehensive metabolic panel - VITAMIN D 25 Hydroxy (Vit-D Deficiency, Fractures) - TSH - Vitamin B12  5. Screening for hyperlipidemia We will obtain labs today.  - Comprehensive metabolic panel - Lipid Panel With LDL/HDL Ratio  6. Obesity: Current BMI 27.3 [name] is currently in the action stage of change. As such, her goal is to continue with weight loss efforts. She has agreed to keeping a food journal and adhering to recommended goals of 1250 calories and 75+ grams of protein.   Exercise goals: As is.  Behavioral modification strategies: increasing water intake, meal planning and cooking strategies, and planning for success.  [name] has agreed to follow-up with our clinic in 4 weeks. She was informed of the importance of frequent follow-up visits to maximize her success with intensive lifestyle modifications for her multiple health conditions.   [name] was informed we would discuss her lab results at her next visit unless there is a critical issue that needs to be addressed sooner. [name] agreed to keep her next visit at the agreed upon time to discuss these results.  Objective:   Blood pressure 110/73, pulse 68, temperature 97.9 F (36.6 C), height 5' 3"  (1.6 m), weight 154 lb (69.9 kg), SpO2 100 %. Body mass  index is 27.28 kg/m.  General: Cooperative, alert, well developed, in no acute distress. HEENT: Conjunctivae and lids unremarkable. Cardiovascular: Regular rhythm.  Lungs: Normal work of breathing. Neurologic: No focal deficits.   Lab Results  Component Value Date   CREATININE 1.01 (H)  07/11/2022   BUN 10 07/11/2022   NA 139 07/11/2022   K 3.9 07/11/2022   CL 104 07/11/2022   CO2 22 07/11/2022   Lab Results  Component Value Date   ALT 22 07/11/2022   AST 14 07/11/2022   ALKPHOS 86 07/11/2022   BILITOT 0.2 07/11/2022   Lab Results  Component Value Date   HGBA1C 5.6 07/11/2022   HGBA1C 5.5 04/28/2021   HGBA1C 5.4 05/12/2020   HGBA1C 5.5 01/07/2020   HGBA1C 5.7 01/18/2016   Lab Results  Component Value Date   INSULIN 6.7 07/11/2022   INSULIN 6.1 04/28/2021   INSULIN 6.1 05/12/2020   INSULIN 8.1 01/07/2020   Lab Results  Component Value Date   TSH 0.814 07/11/2022   Lab Results  Component Value Date   CHOL 131 07/11/2022   HDL 71 07/11/2022   LDLCALC 45 07/11/2022   TRIG 79 07/11/2022   CHOLHDL 2.5 03/08/2013   Lab Results  Component Value Date   VD25OH 28.2 (L) 07/11/2022   VD25OH 27.0 (L) 04/28/2021   VD25OH 27.3 (L) 05/12/2020   Lab Results  Component Value Date   WBC 5.9 07/11/2022   HGB 11.5 07/11/2022   HCT 36.1 07/11/2022   MCV 87 07/11/2022   PLT 220 07/11/2022   Lab Results  Component Value Date   IRON 95 07/11/2022   TIBC 267 01/07/2020   FERRITIN 429 (H) 07/11/2022    Attestation Statements:   Reviewed by clinician on day of visit: allergies, medications, problem list, medical history, surgical history, family history, social history, and previous encounter notes.  I, Brendell Tyus, RMA, am acting as transcriptionist for Everardo Pacific, FNP.  I have reviewed the above documentation for accuracy and completeness, and I agree with the above. Everardo Pacific, FNP

## 2022-07-27 ENCOUNTER — Telehealth (INDEPENDENT_AMBULATORY_CARE_PROVIDER_SITE_OTHER): Payer: BC Managed Care – PPO | Admitting: Family Medicine

## 2022-07-27 NOTE — Progress Notes (Signed)
TeleHealth Visit:  This visit was completed with telemedicine (audio/video) technology. [place] has verbally consented to this TeleHealth visit. The patient is located at home, the provider is located at home. The participants in this visit include the listed provider and patient. The visit was conducted today via MyChart video.  OBESITY Korina is here to discuss her progress with her obesity treatment plan along with follow-up of her obesity related diagnoses.   Today's visit was # 20 Starting weight: 186 lbs Starting date: 01/07/2020 Weight at last in office visit: 154 lbs on 07/11/22 Total weight loss: 32 lbs at last in office visit on 07/11/22. Today's reported weight: 153 lbs   Nutrition Plan: keeping a food journal and adhering to recommended goals of 1250 calories and 75 gms protein.   Current exercise: Maydell states she is walking and weight training for 60 minutes 3 times per week at home.  Interim History: Shyra is journaling regularly and meeting calorie and protein goals.  Hunger is satisfied as long as she stays hydrated.  Reports water intake is been low the past few weeks She has set a new weight goal of 140-145 pounds She will be leaving on a trip to [facility] [place] tomorrow and wants some tips about how to maintain weight while on the trip.  Assessment/Plan:  We went over her recent lab results in depth today.  1. Vitamin D Deficiency Vitamin D is not at goal of 50.  Vitamin D was 28.2 on 07/11/2022.  She has been off of her prescription vitamin D pending lab results.  Lab Results  Component Value Date   VD25OH 28.2 (L) 07/11/2022   VD25OH 27.0 (L) 04/28/2021   VD25OH 27.3 (L) 05/12/2020    Plan: Resume weekly high-dose vitamin D. Refill prescription vitamin D 50,000 IU weekly.  2. Iron deficiency anemia Anemia is stable.  She has seen hematology and had iron infusions in the past.  She has not set up a follow-up with hematology yet.  Ferritin  was high at 429 on 07/11/2022.  Hemoglobin/hematocrit were normal. Iron supplementation: Iron 325 mg by mouth daily Lab Results  Component Value Date   WBC 5.9 07/11/2022   HGB 11.5 07/11/2022   HCT 36.1 07/11/2022   MCV 87 07/11/2022   PLT 220 07/11/2022   Lab Results  Component Value Date   IRON 95 07/11/2022   TIBC 267 01/07/2020   FERRITIN 429 (H) 07/11/2022     Plan: Set up follow-up appointment with hematology. Discontinue oral iron until visit with hematology.  3. Obesity: Current BMI 27.3 Bela is currently in the action stage of change. As such, her goal is to continue with weight loss efforts.  She has agreed to keeping a food journal and adhering to recommended goals of 1250 calories and 75 gms protein.   Discussed strategies for maintaining weight while traveling.  Exercise goals: as is  Behavioral modification strategies: increasing lean protein intake, decreasing simple carbohydrates, increasing water intake, travel eating strategies, planning for success, and keeping a strict food journal.  Florentina has agreed to follow-up with our clinic in 4 weeks.   No orders of the defined types were placed in this encounter.   Medications Discontinued During This Encounter  Medication Reason   Vitamin D, Ergocalciferol, (DRISDOL) 1.25 MG (50000 UNIT) CAPS capsule Reorder   ferrous gluconate (FERGON) 324 MG tablet Discontinued by provider     Meds ordered this encounter  Medications   Vitamin D, Ergocalciferol, (DRISDOL) 1.25 MG (50000 UNIT)  CAPS capsule    Sig: Take 1 capsule (50,000 Units total) by mouth every 7 (seven) days.    Dispense:  12 capsule    Refill:  0    Order Specific Question:   Supervising Provider    Answer:   Dennard Nip D [[id number]]      Objective:   VITALS: Per patient if applicable, see vitals. GENERAL: Alert and in no acute distress. CARDIOPULMONARY: No increased WOB. Speaking in clear sentences.  PSYCH: Pleasant and cooperative.  Speech normal rate and rhythm. Affect is appropriate. Insight and judgement are appropriate. Attention is focused, linear, and appropriate.  NEURO: Oriented as arrived to appointment on time with no prompting.   Lab Results  Component Value Date   CREATININE 1.01 (H) 07/11/2022   BUN 10 07/11/2022   NA 139 07/11/2022   K 3.9 07/11/2022   CL 104 07/11/2022   CO2 22 07/11/2022   Lab Results  Component Value Date   ALT 22 07/11/2022   AST 14 07/11/2022   ALKPHOS 86 07/11/2022   BILITOT 0.2 07/11/2022   Lab Results  Component Value Date   HGBA1C 5.6 07/11/2022   HGBA1C 5.5 04/28/2021   HGBA1C 5.4 05/12/2020   HGBA1C 5.5 01/07/2020   HGBA1C 5.7 01/18/2016   Lab Results  Component Value Date   INSULIN 6.7 07/11/2022   INSULIN 6.1 04/28/2021   INSULIN 6.1 05/12/2020   INSULIN 8.1 01/07/2020   Lab Results  Component Value Date   TSH 0.814 07/11/2022   Lab Results  Component Value Date   CHOL 131 07/11/2022   HDL 71 07/11/2022   LDLCALC 45 07/11/2022   TRIG 79 07/11/2022   CHOLHDL 2.5 03/08/2013   Lab Results  Component Value Date   WBC 5.9 07/11/2022   HGB 11.5 07/11/2022   HCT 36.1 07/11/2022   MCV 87 07/11/2022   PLT 220 07/11/2022   Lab Results  Component Value Date   IRON 95 07/11/2022   TIBC 267 01/07/2020   FERRITIN 429 (H) 07/11/2022   Lab Results  Component Value Date   VD25OH 28.2 (L) 07/11/2022   VD25OH 27.0 (L) 04/28/2021   VD25OH 27.3 (L) 05/12/2020    Attestation Statements:   Reviewed by clinician on day of visit: allergies, medications, problem list, medical history, surgical history, family history, social history, and previous encounter notes.

## 2022-07-28 ENCOUNTER — Encounter (INDEPENDENT_AMBULATORY_CARE_PROVIDER_SITE_OTHER): Payer: Self-pay | Admitting: Family Medicine

## 2022-07-28 ENCOUNTER — Telehealth (INDEPENDENT_AMBULATORY_CARE_PROVIDER_SITE_OTHER): Payer: BC Managed Care – PPO | Admitting: Family Medicine

## 2022-07-28 DIAGNOSIS — Z6827 Body mass index (BMI) 27.0-27.9, adult: Secondary | ICD-10-CM

## 2022-07-28 DIAGNOSIS — E559 Vitamin D deficiency, unspecified: Secondary | ICD-10-CM | POA: Diagnosis not present

## 2022-07-28 DIAGNOSIS — D509 Iron deficiency anemia, unspecified: Secondary | ICD-10-CM | POA: Diagnosis not present

## 2022-07-28 DIAGNOSIS — E669 Obesity, unspecified: Secondary | ICD-10-CM | POA: Diagnosis not present

## 2022-07-28 MED ORDER — VITAMIN D (ERGOCALCIFEROL) 1.25 MG (50000 UNIT) PO CAPS: 50000.0000 [IU] | ORAL_CAPSULE | ORAL | 0 refills | Status: DC

## 2022-08-24 NOTE — Progress Notes (Signed)
TeleHealth Visit:  This visit was completed with telemedicine (audio/video) technology. Sara Patel has verbally consented to this TeleHealth visit. The patient is located at home, the provider is located at home. The participants in this visit include the listed provider and patient. The visit was conducted today via MyChart video.  OBESITY Sara Patel is here to discuss her progress with her obesity treatment plan along with follow-up of her obesity related diagnoses.   Today's visit was # 21 Starting weight: 186 lbs Starting date: 01/07/2020 Weight at last in office visit: 154 lbs on 07/11/22 Total weight loss: 32 lbs at last in office visit on 07/11/22. Today's reported weight: 153 lbs   Weight goal is 140-145 pounds  Nutrition Plan: keeping a food journal and adhering to recommended goals of 1250 calories and 75 grams protein daily  Current exercise: walking and weight training for 60 minutes 3 times per week at home.  Interim History: Sara Patel went on a trip to [facility] and did not gain any weight.  She is actually down 1 pound.  She is very consistent with journaling and meets her calorie and protein goals.  Water intake is good.  Her husband is also a patient at our clinic and she says this is helpful with both of their efforts.  Sometimes she gets frustrated with slow weight loss but she is also aware that she has never lost weight and stuck with something this long.  Assessment/Plan:  1. Insulin Resistance Last A1c was 5.6.  Fasting insulin only mildly elevated. Medication(s): Metformin 500 mg twice daily.  Tolerates well. Lab Results  Component Value Date   HGBA1C 5.6 07/11/2022   Lab Results  Component Value Date   INSULIN 6.7 07/11/2022   INSULIN 6.1 04/28/2021   INSULIN 6.1 05/12/2020   INSULIN 8.1 01/07/2020    Plan Continue metformin 500 mg twice daily.   2. Obesity: Current BMI 27.3 Sara Patel is currently in the action stage of change. As such,  her goal is to continue with weight loss efforts.  She has agreed to keeping a food journal and adhering to recommended goals of 1250 calories and 75 grams protein daily.   Exercise goals: as is  Behavioral modification strategies: increasing lean protein intake, decreasing simple carbohydrates, keeping healthy foods in the home, and keeping a strict food journal.  Sara Patel has agreed to follow-up with our clinic in 5 weeks.   No orders of the defined types were placed in this encounter.   There are no discontinued medications.   No orders of the defined types were placed in this encounter.     Objective:   VITALS: Per patient if applicable, see vitals. GENERAL: Alert and in no acute distress. CARDIOPULMONARY: No increased WOB. Speaking in clear sentences.  PSYCH: Pleasant and cooperative. Speech normal rate and rhythm. Affect is appropriate. Insight and judgement are appropriate. Attention is focused, linear, and appropriate.  NEURO: Oriented as arrived to appointment on time with no prompting.   Lab Results  Component Value Date   CREATININE 1.01 (H) 07/11/2022   BUN 10 07/11/2022   NA 139 07/11/2022   K 3.9 07/11/2022   CL 104 07/11/2022   CO2 22 07/11/2022   Lab Results  Component Value Date   ALT 22 07/11/2022   AST 14 07/11/2022   ALKPHOS 86 07/11/2022   BILITOT 0.2 07/11/2022   Lab Results  Component Value Date   HGBA1C 5.6 07/11/2022   HGBA1C 5.5 04/28/2021   HGBA1C 5.4 05/12/2020  HGBA1C 5.5 01/07/2020   HGBA1C 5.7 01/18/2016   Lab Results  Component Value Date   INSULIN 6.7 07/11/2022   INSULIN 6.1 04/28/2021   INSULIN 6.1 05/12/2020   INSULIN 8.1 01/07/2020   Lab Results  Component Value Date   TSH 0.814 07/11/2022   Lab Results  Component Value Date   CHOL 131 07/11/2022   HDL 71 07/11/2022   LDLCALC 45 07/11/2022   TRIG 79 07/11/2022   CHOLHDL 2.5 03/08/2013   Lab Results  Component Value Date   WBC 5.9 07/11/2022   HGB 11.5  07/11/2022   HCT 36.1 07/11/2022   MCV 87 07/11/2022   PLT 220 07/11/2022   Lab Results  Component Value Date   IRON 95 07/11/2022   TIBC 267 01/07/2020   FERRITIN 429 (H) 07/11/2022   Lab Results  Component Value Date   VD25OH 28.2 (L) 07/11/2022   VD25OH 27.0 (L) 04/28/2021   VD25OH 27.3 (L) 05/12/2020    Attestation Statements:   Reviewed by clinician on day of visit: allergies, medications, problem list, medical history, surgical history, family history, social history, and previous encounter notes.

## 2022-08-25 ENCOUNTER — Encounter (INDEPENDENT_AMBULATORY_CARE_PROVIDER_SITE_OTHER): Payer: Self-pay | Admitting: Family Medicine

## 2022-08-25 ENCOUNTER — Telehealth (INDEPENDENT_AMBULATORY_CARE_PROVIDER_SITE_OTHER): Payer: BC Managed Care – PPO | Admitting: Family Medicine

## 2022-08-25 DIAGNOSIS — E669 Obesity, unspecified: Secondary | ICD-10-CM

## 2022-08-25 DIAGNOSIS — Z6827 Body mass index (BMI) 27.0-27.9, adult: Secondary | ICD-10-CM | POA: Diagnosis not present

## 2022-08-25 DIAGNOSIS — E88819 Insulin resistance, unspecified: Secondary | ICD-10-CM | POA: Diagnosis not present

## 2022-09-02 ENCOUNTER — Other Ambulatory Visit (INDEPENDENT_AMBULATORY_CARE_PROVIDER_SITE_OTHER): Payer: Self-pay | Admitting: Family Medicine

## 2022-09-02 DIAGNOSIS — F509 Eating disorder, unspecified: Secondary | ICD-10-CM

## 2022-09-27 NOTE — Progress Notes (Unsigned)
TeleHealth Visit:  This visit was completed with telemedicine (audio/video) technology. Sara Patel has verbally consented to this TeleHealth visit. The patient is located at home, the provider is located at home. The participants in this visit include the listed provider and patient. The visit was conducted today via MyChart video.  OBESITY Sara Patel is here to discuss her progress with her obesity treatment plan along with follow-up of her obesity related diagnoses.   Today's visit was # 22 Starting weight: 186 lbs Starting date: 01/07/2020 Weight at last in office visit: 154 lbs on 07/11/22 Total weight loss: 32 lbs at last in office visit on 07/11/22. Weight reported at virtual visit on 08/25/2022: 153 pounds Today's reported weight: 152 lbs     Nutrition Plan: keeping a food journal and adhering to recommended goals of 1250 calories and 75 grams protein daily   Current exercise: walking and weight training for 60 minutes 2 times per week at home.  Interim History: Sara Patel has been very busy lately.  Her daughter has been sick and Sara Patel has been suffering from sacral pain which is a chronic problem for her.  She has not been journaling but she basically eats the same thing most days.  She is happy with a 1 pound weight loss despite having some off plan meals. Frequency of exercise is a bit decreased due to sacral pain.  Her gastroenterologist prescribed methotrexate which she has been taking for a few weeks for the sacral pain.  No relief yet. She prioritizes protein and drinks water, Coke Zero, coffee. Weight goal is 140-145 pounds Assessment/Plan:  1. Anxiety On bupropion 150 mg twice daily for food cravings.  Cravings are well controlled.  She did notice some decrease in anxiety when she first started the Wellbutrin.  PCP encouraged her to start counseling.  Plan: Advised her to start counseling when she is able. Consider starting Lexapro 5 mg daily.  Patient wants to  research before deciding.  2. Vitamin D Deficiency Vitamin D is not at goal of 50.  Last vitamin D was 28.2 on 07/11/2022. She is on weekly prescription Vitamin D 50,000 IU.  Lab Results  Component Value Date   VD25OH 28.2 (L) 07/11/2022   VD25OH 27.0 (L) 04/28/2021   VD25OH 27.3 (L) 05/12/2020    Plan: Refill prescription vitamin D 50,000 IU weekly.   3. Insulin Resistance Denies polyphagia. Medication(s): Metformin 500 mg twice daily with meals.  Tolerating well. Lab Results  Component Value Date   HGBA1C 5.6 07/11/2022   Lab Results  Component Value Date   INSULIN 6.7 07/11/2022   INSULIN 6.1 04/28/2021   INSULIN 6.1 05/12/2020   INSULIN 8.1 01/07/2020    Plan Refill metformin 500 mg twice daily with meals.   4.0Obesity: Current BMI 27.29 Sara Patel is currently in the action stage of change. As such, her goal is to continue with weight loss efforts.  She has agreed to keeping a food journal and adhering to recommended goals of 1250 calories and 75 grams protein daily or PC/[place].  Exercise goals: as is  Behavioral modification strategies: increasing lean protein intake, decreasing simple carbohydrates, and planning for success.  Sara Patel has agreed to follow-up with our clinic in 4 weeks with Dr. Leafy Ro.  Will do fasting labs and repeat IC.  No orders of the defined types were placed in this encounter.   Medications Discontinued During This Encounter  Medication Reason   metFORMIN (GLUCOPHAGE) 500 MG tablet Reorder   Vitamin D, Ergocalciferol, (DRISDOL) 1.25 MG (  50000 UNIT) CAPS capsule Reorder     Meds ordered this encounter  Medications   metFORMIN (GLUCOPHAGE) 500 MG tablet    Sig: Take 1 tablet (500 mg total) by mouth 2 (two) times daily with a meal.    Dispense:  180 tablet    Refill:  0    Order Specific Question:   Supervising Provider    Answer:   Netty Starring   Vitamin D, Ergocalciferol, (DRISDOL) 1.25 MG (50000 UNIT) CAPS capsule     Sig: Take 1 capsule (50,000 Units total) by mouth every 7 (seven) days.    Dispense:  12 capsule    Refill:  0    Order Specific Question:   Supervising Provider    Answer:   Dell Ponto [[id number]]      Objective:   VITALS: Per patient if applicable, see vitals. GENERAL: Alert and in no acute distress. CARDIOPULMONARY: No increased WOB. Speaking in clear sentences.  PSYCH: Pleasant and cooperative. Speech normal rate and rhythm. Affect is appropriate. Insight and judgement are appropriate. Attention is focused, linear, and appropriate.  NEURO: Oriented as arrived to appointment on time with no prompting.   Lab Results  Component Value Date   CREATININE 1.01 (H) 07/11/2022   BUN 10 07/11/2022   NA 139 07/11/2022   K 3.9 07/11/2022   CL 104 07/11/2022   CO2 22 07/11/2022   Lab Results  Component Value Date   ALT 22 07/11/2022   AST 14 07/11/2022   ALKPHOS 86 07/11/2022   BILITOT 0.2 07/11/2022   Lab Results  Component Value Date   HGBA1C 5.6 07/11/2022   HGBA1C 5.5 04/28/2021   HGBA1C 5.4 05/12/2020   HGBA1C 5.5 01/07/2020   HGBA1C 5.7 01/18/2016   Lab Results  Component Value Date   INSULIN 6.7 07/11/2022   INSULIN 6.1 04/28/2021   INSULIN 6.1 05/12/2020   INSULIN 8.1 01/07/2020   Lab Results  Component Value Date   TSH 0.814 07/11/2022   Lab Results  Component Value Date   CHOL 131 07/11/2022   HDL 71 07/11/2022   LDLCALC 45 07/11/2022   TRIG 79 07/11/2022   CHOLHDL 2.5 03/08/2013   Lab Results  Component Value Date   WBC 5.9 07/11/2022   HGB 11.5 07/11/2022   HCT 36.1 07/11/2022   MCV 87 07/11/2022   PLT 220 07/11/2022   Lab Results  Component Value Date   IRON 95 07/11/2022   TIBC 267 01/07/2020   FERRITIN 429 (H) 07/11/2022   Lab Results  Component Value Date   VD25OH 28.2 (L) 07/11/2022   VD25OH 27.0 (L) 04/28/2021   VD25OH 27.3 (L) 05/12/2020    Attestation Statements:   Reviewed by clinician on day of visit: allergies, medications,  problem list, medical history, surgical history, family history, social history, and previous encounter notes.

## 2022-09-28 ENCOUNTER — Encounter (INDEPENDENT_AMBULATORY_CARE_PROVIDER_SITE_OTHER): Payer: Self-pay | Admitting: Family Medicine

## 2022-09-28 ENCOUNTER — Telehealth (INDEPENDENT_AMBULATORY_CARE_PROVIDER_SITE_OTHER): Payer: BC Managed Care – PPO | Admitting: Family Medicine

## 2022-09-28 DIAGNOSIS — Z6827 Body mass index (BMI) 27.0-27.9, adult: Secondary | ICD-10-CM

## 2022-09-28 DIAGNOSIS — E88819 Insulin resistance, unspecified: Secondary | ICD-10-CM | POA: Diagnosis not present

## 2022-09-28 DIAGNOSIS — F419 Anxiety disorder, unspecified: Secondary | ICD-10-CM | POA: Diagnosis not present

## 2022-09-28 DIAGNOSIS — E669 Obesity, unspecified: Secondary | ICD-10-CM | POA: Diagnosis not present

## 2022-09-28 DIAGNOSIS — E559 Vitamin D deficiency, unspecified: Secondary | ICD-10-CM

## 2022-09-28 MED ORDER — VITAMIN D (ERGOCALCIFEROL) 1.25 MG (50000 UNIT) PO CAPS: 50000.0000 [IU] | ORAL_CAPSULE | ORAL | 0 refills | Status: DC

## 2022-09-28 MED ORDER — METFORMIN HCL 500 MG PO TABS: 500.0000 mg | ORAL_TABLET | Freq: Two times a day (BID) | ORAL | 0 refills | Status: DC

## 2022-10-14 ENCOUNTER — Encounter (INDEPENDENT_AMBULATORY_CARE_PROVIDER_SITE_OTHER): Payer: Self-pay | Admitting: Family Medicine

## 2022-10-17 NOTE — Telephone Encounter (Signed)
Appt has been cancelled.  

## 2022-10-24 ENCOUNTER — Ambulatory Visit (INDEPENDENT_AMBULATORY_CARE_PROVIDER_SITE_OTHER): Payer: BC Managed Care – PPO | Admitting: Family Medicine

## 2022-10-29 ENCOUNTER — Other Ambulatory Visit (INDEPENDENT_AMBULATORY_CARE_PROVIDER_SITE_OTHER): Payer: Self-pay | Admitting: Family Medicine

## 2022-10-29 DIAGNOSIS — E559 Vitamin D deficiency, unspecified: Secondary | ICD-10-CM

## 2022-11-17 NOTE — Progress Notes (Signed)
TeleHealth Visit:  This visit was completed with telemedicine (audio/video) technology. Sara Patel has verbally consented to this TeleHealth visit. The patient is located at home, the provider is located at home. The participants in this visit include the listed provider and patient. The visit was conducted today via MyChart video.  OBESITY Sara Patel is here to discuss her progress with her obesity treatment plan along with follow-up of her obesity related diagnoses.   Today's visit was # 23 Starting weight: 186 lbs Starting date: 01/07/2020 Weight at last in office visit: 154 lbs on 07/11/22 Total weight loss: 32 lbs at last in office visit on 07/11/22. Weight reported at virtual visit on 09/28/2022: 152 pounds Today's reported weight: 151 lbs yesterday  Nutrition Plan: keeping a food journal and adhering to recommended goals of 1250 calories and 75 grams protein daily- 60-70% adherence   Current exercise:  sporadic over past several weeks  Interim History:  She has done well over the holiday season and has lost 1 pound over the last 7 weeks.  She also took a cruise in December.   She is journaling the first couple days of the week and meeting calorie and protein goals.  The other days of the week she eats the same foods but does not journal.   Water intake is good. Notes increased hunger over the past several weeks. Exercise has been sporadic because her daughter has been out of daycare the past few weeks. Weight goal is 145 lbs. Assessment/Plan:  1. Insulin Resistance Notes increased hunger over the past several weeks.  She says she has been eating more carbohydrates. On metformin 500 mg at breakfast and lunch.  Tolerating well. Lab Results  Component Value Date   HGBA1C 5.6 07/11/2022   Lab Results  Component Value Date   INSULIN 6.7 07/11/2022   INSULIN 6.1 04/28/2021   INSULIN 6.1 05/12/2020   INSULIN 8.1 01/07/2020    Plan Refill metformin 500 mg twice  daily. Decrease intake of carbs.  Increase protein. Check labs and IC next office visit.  2.  Anxiety with depression Notes increased anxiety and mild depression over the past few months .  Has history of panic attacks and feels she might be experiencing mild panic attacks.  Has attended counseling in the past. Currently on bupropion for cravings.  Plan: New prescription-sertraline 25 mg daily. Continue bupropion.  Refill bupropion 150 mg twice daily. Encouraged her to establish with a counselor.  3. Vitamin D Deficiency Vitamin D is not at goal of 50.  She is on weekly prescription Vitamin D 50,000 IU.  Lab Results  Component Value Date   VD25OH 28.2 (L) 07/11/2022   VD25OH 27.0 (L) 04/28/2021   VD25OH 27.3 (L) 05/12/2020    Plan: Refill prescription vitamin D 50,000 IU weekly. Check vitamin D level next office visit.   4. Obesity: Current BMI 27.3 Sara Patel is currently in the action stage of change. As such, her goal is to continue with weight loss efforts.  She has agreed to keeping a food journal and adhering to recommended goals of 1250 calories and 75 grams protein daily.   Exercise goals:  work on getting back to regular exercise routine.  Behavioral modification strategies: increasing lean protein intake, decreasing simple carbohydrates, and planning for success.  Sara Patel has agreed to follow-up with our clinic in 4 weeks with IC and fasting labs.   No orders of the defined types were placed in this encounter.   Medications Discontinued During This Encounter  Medication Reason   buPROPion (WELLBUTRIN SR) 150 MG 12 hr tablet Reorder   metFORMIN (GLUCOPHAGE) 500 MG tablet Reorder   Vitamin D, Ergocalciferol, (DRISDOL) 1.25 MG (50000 UNIT) CAPS capsule Reorder     Meds ordered this encounter  Medications   metFORMIN (GLUCOPHAGE) 500 MG tablet    Sig: Take 1 tablet (500 mg total) by mouth 2 (two) times daily with a meal.    Dispense:  180 tablet    Refill:   0    Order Specific Question:   Supervising Provider    Answer:   Netty Starring   Vitamin D, Ergocalciferol, (DRISDOL) 1.25 MG (50000 UNIT) CAPS capsule    Sig: Take 1 capsule (50,000 Units total) by mouth every 7 (seven) days.    Dispense:  12 capsule    Refill:  0    Order Specific Question:   Supervising Provider    Answer:   Dell Ponto [[id number]]   buPROPion (WELLBUTRIN SR) 150 MG 12 hr tablet    Sig: Take 1 tablet (150 mg total) by mouth 2 (two) times daily.    Dispense:  180 tablet    Refill:  0    Order Specific Question:   Supervising Provider    Answer:   Dell Ponto [[id number]]   sertraline (ZOLOFT) 25 MG tablet    Sig: Take 1 tablet (25 mg total) by mouth daily.    Dispense:  30 tablet    Refill:  3    Order Specific Question:   Supervising Provider    Answer:   Dell Ponto [[id number]]      Objective:   VITALS: Per patient if applicable, see vitals. GENERAL: Alert and in no acute distress. CARDIOPULMONARY: No increased WOB. Speaking in clear sentences.  PSYCH: Pleasant and cooperative. Speech normal rate and rhythm. Affect is appropriate. Insight and judgement are appropriate. Attention is focused, linear, and appropriate.  NEURO: Oriented as arrived to appointment on time with no prompting.   Lab Results  Component Value Date   CREATININE 1.01 (H) 07/11/2022   BUN 10 07/11/2022   NA 139 07/11/2022   K 3.9 07/11/2022   CL 104 07/11/2022   CO2 22 07/11/2022   Lab Results  Component Value Date   ALT 22 07/11/2022   AST 14 07/11/2022   ALKPHOS 86 07/11/2022   BILITOT 0.2 07/11/2022   Lab Results  Component Value Date   HGBA1C 5.6 07/11/2022   HGBA1C 5.5 04/28/2021   HGBA1C 5.4 05/12/2020   HGBA1C 5.5 01/07/2020   HGBA1C 5.7 01/18/2016   Lab Results  Component Value Date   INSULIN 6.7 07/11/2022   INSULIN 6.1 04/28/2021   INSULIN 6.1 05/12/2020   INSULIN 8.1 01/07/2020   Lab Results  Component Value Date   TSH 0.814 07/11/2022   Lab Results   Component Value Date   CHOL 131 07/11/2022   HDL 71 07/11/2022   LDLCALC 45 07/11/2022   TRIG 79 07/11/2022   CHOLHDL 2.5 03/08/2013   Lab Results  Component Value Date   WBC 5.9 07/11/2022   HGB 11.5 07/11/2022   HCT 36.1 07/11/2022   MCV 87 07/11/2022   PLT 220 07/11/2022   Lab Results  Component Value Date   IRON 95 07/11/2022   TIBC 267 01/07/2020   FERRITIN 429 (H) 07/11/2022   Lab Results  Component Value Date   VD25OH 28.2 (L) 07/11/2022   VD25OH 27.0 (L) 04/28/2021   VD25OH 27.3 (L)  05/12/2020    Attestation Statements:   Reviewed by clinician on day of visit: allergies, medications, problem list, medical history, surgical history, family history, social history, and previous encounter notes.

## 2022-11-22 ENCOUNTER — Encounter (INDEPENDENT_AMBULATORY_CARE_PROVIDER_SITE_OTHER): Payer: Self-pay | Admitting: Family Medicine

## 2022-11-22 ENCOUNTER — Telehealth (INDEPENDENT_AMBULATORY_CARE_PROVIDER_SITE_OTHER): Payer: BC Managed Care – PPO | Admitting: Family Medicine

## 2022-11-22 DIAGNOSIS — Z6827 Body mass index (BMI) 27.0-27.9, adult: Secondary | ICD-10-CM

## 2022-11-22 DIAGNOSIS — E669 Obesity, unspecified: Secondary | ICD-10-CM | POA: Diagnosis not present

## 2022-11-22 DIAGNOSIS — F418 Other specified anxiety disorders: Secondary | ICD-10-CM | POA: Diagnosis not present

## 2022-11-22 DIAGNOSIS — E88819 Insulin resistance, unspecified: Secondary | ICD-10-CM | POA: Diagnosis not present

## 2022-11-22 DIAGNOSIS — E559 Vitamin D deficiency, unspecified: Secondary | ICD-10-CM

## 2022-11-22 MED ORDER — SERTRALINE HCL 25 MG PO TABS: 25.0000 mg | ORAL_TABLET | Freq: Every day | ORAL | 3 refills | Status: DC

## 2022-11-22 MED ORDER — VITAMIN D (ERGOCALCIFEROL) 1.25 MG (50000 UNIT) PO CAPS: 50000.0000 [IU] | ORAL_CAPSULE | ORAL | 0 refills | Status: DC

## 2022-11-22 MED ORDER — METFORMIN HCL 500 MG PO TABS: 500.0000 mg | ORAL_TABLET | Freq: Two times a day (BID) | ORAL | 0 refills | Status: DC

## 2022-11-22 MED ORDER — BUPROPION HCL ER (SR) 150 MG PO TB12: 150.0000 mg | ORAL_TABLET | Freq: Two times a day (BID) | ORAL | 0 refills | Status: DC

## 2022-12-16 ENCOUNTER — Other Ambulatory Visit (INDEPENDENT_AMBULATORY_CARE_PROVIDER_SITE_OTHER): Payer: Self-pay | Admitting: Family Medicine

## 2022-12-16 DIAGNOSIS — F418 Other specified anxiety disorders: Secondary | ICD-10-CM

## 2022-12-26 ENCOUNTER — Ambulatory Visit (INDEPENDENT_AMBULATORY_CARE_PROVIDER_SITE_OTHER): Payer: BC Managed Care – PPO | Admitting: Physician Assistant

## 2023-01-19 NOTE — Progress Notes (Addendum)
TeleHealth Visit:  This visit was completed with telemedicine (audio/video) technology. Sara Patel has verbally consented to this TeleHealth visit. The patient is located at home, the provider is located at home. The participants in this visit include the listed provider and patient. The visit was conducted today via MyChart video.  OBESITY Sara Patel is here to discuss her progress with her obesity treatment plan along with follow-up of her obesity related diagnoses.   Today's visit was # 24 Starting weight: 186 lbs Starting date: 01/07/2020 Weight at last in office visit: 154 lbs on 07/11/22 Total weight loss: 32 lbs at last in office visit on 07/11/22. Weight reported at virtual visit on 11/22/22: 151 pounds Today's reported weight: 144 lbs   Nutrition Plan: keeping a food journal and adhering to recommended goals of 1250 calories and 75 grams protein daily- 60-70% adherence   Current exercise:  30 minutes strength training 3 times per week (at home)  Interim History:  Still weighing her food but not journaling.. She is at her goal weight.  She feels her recent weight loss has been due to the addition of sertraline which has greatly improved her mood. Sara Patel is: Eating all of the prescribed protein -yes  Skipping meals  - no Hunger controlled well controlled. Cravings controlled well controlled. Journaling Consistently No Meeting protein goals N/A Meeting calorie goals N/A  Assessment/Plan:  1. Anxiety with depression. Sertraline 25 mg added last OV. Also on bupropion SR 150 mg twice daily. Side effects-Has had some nausea which is improving.  Reports great improvement in overall mood.    Sadness and anxiety have improved.  Sleeping better.  Less feeling of "fight or flight".  Feels that "dark cloud"  has been removed.  Plan: Continue bupropion 150 mg twice daily. Refill sertraline 25 mg daily.  2. Insulin Resistance Last fasting insulin very close to normal at 6.7  on 07/11/2022.  Last A1c 5.6 on 07/11/2022. Taking metformin 500 mg twice daily with meals. Lab Results  Component Value Date   HGBA1C 5.6 07/11/2022   HGBA1C 5.5 04/28/2021   HGBA1C 5.4 05/12/2020   HGBA1C 5.5 01/07/2020   HGBA1C 5.7 01/18/2016   Lab Results  Component Value Date   INSULIN 6.7 07/11/2022   INSULIN 6.1 04/28/2021   INSULIN 6.1 05/12/2020   INSULIN 8.1 01/07/2020    Plan Refill metformin 500 mg twice daily with meals. Lab orders placed.  She will go to [facility] to have these drawn.   3. Vitamin D Deficiency Vitamin D is not at goal of 50.  Most recent vitamin D level was 28.2 on 07/11/2022.[name] She is on  prescription ergocalciferol 50,000 IU weekly. Lab Results  Component Value Date   VD25OH 28.2 (L) 07/11/2022   VD25OH 27.0 (L) 04/28/2021   VD25OH 27.3 (L) 05/12/2020    Plan: Refill prescription vitamin D 50,000 IU weekly. Have vitamin D level drawn at [facility].  4.  Ankylosing spondylitis Recent diagnosis.  Having pain in SI joints.  Sara Patel added.  Already taking Stelara for Crohn's disease.  Seeing Dr. Lyndel Safe at [facility] arthritis/R[facility]umatology in [place].  Plan: Continue all prescribed medications and follow-up with Dr. Lyndel Safe as directed.  5. Generalized Obesity: Current BMI 25  Sara Patel is currently in the action stage of change. As such, her goal is to maintain weight for now.  She has agreed to keeping a food journal with goal of 1500 calories and 80 grams of protein daily.  1.  Add calories for weight maintenance. 2.  Increase  protein goal to 80 g/day. 3.  She will journal regularly for the next week or so.  Exercise goals: Continue as is and add in walking as weather warms.  Behavioral modification strategies: increasing lean protein intake and increase frequency of journaling.  Sara Patel has agreed to follow-up with our clinic in 4 weeks.   Orders Placed This Encounter  Procedures   Comprehensive metabolic panel   Hemoglobin A1c    Insulin, random   VITAMIN D 25 Hydroxy (Vit-D Deficiency, Fractures)    Medications Discontinued During This Encounter  Medication Reason   metFORMIN (GLUCOPHAGE) 500 MG tablet Reorder   Vitamin D, Ergocalciferol, (DRISDOL) 1.25 MG (50000 UNIT) CAPS capsule Reorder   sertraline (ZOLOFT) 25 MG tablet Reorder     Meds ordered this encounter  Medications   sertraline (ZOLOFT) 25 MG tablet    Sig: Take 1 tablet (25 mg total) by mouth daily.    Dispense:  90 tablet    Refill:  0    Order Specific Question:   Supervising Provider    Answer:   Dell Ponto [[id number]]   metFORMIN (GLUCOPHAGE) 500 MG tablet    Sig: Take 1 tablet (500 mg total) by mouth 2 (two) times daily with a meal.    Dispense:  180 tablet    Refill:  0    Order Specific Question:   Supervising Provider    Answer:   Netty Starring   Vitamin D, Ergocalciferol, (DRISDOL) 1.25 MG (50000 UNIT) CAPS capsule    Sig: Take 1 capsule (50,000 Units total) by mouth every 7 (seven) days.    Dispense:  12 capsule    Refill:  0    Order Specific Question:   Supervising Provider    Answer:   Dell Ponto [[id number]]      Objective:   VITALS: Per patient if applicable, see vitals. GENERAL: Alert and in no acute distress. CARDIOPULMONARY: No increased WOB. Speaking in clear sentences.  PSYCH: Pleasant and cooperative. Speech normal rate and rhythm. Affect is appropriate. Insight and judgement are appropriate. Attention is focused, linear, and appropriate.  NEURO: Oriented as arrived to appointment on time with no prompting.   Attestation Statements:   Reviewed by clinician on day of visit: allergies, medications, problem list, medical history, surgical history, family history, social history, and previous encounter notes.   This was prepared with the assistance of Presenter, broadcasting.  Occasional wrong-word or sound-a-like substitutions may have occurred due to the inherent limitations of voice recognition software.

## 2023-01-22 ENCOUNTER — Other Ambulatory Visit (INDEPENDENT_AMBULATORY_CARE_PROVIDER_SITE_OTHER): Payer: Self-pay | Admitting: Family Medicine

## 2023-01-22 DIAGNOSIS — F418 Other specified anxiety disorders: Secondary | ICD-10-CM

## 2023-01-23 ENCOUNTER — Encounter (INDEPENDENT_AMBULATORY_CARE_PROVIDER_SITE_OTHER): Payer: Self-pay | Admitting: Family Medicine

## 2023-01-23 ENCOUNTER — Telehealth (INDEPENDENT_AMBULATORY_CARE_PROVIDER_SITE_OTHER): Payer: BC Managed Care – PPO | Admitting: Family Medicine

## 2023-01-23 VITALS — Ht 63.0 in | Wt 144.0 lb

## 2023-01-23 DIAGNOSIS — E88819 Insulin resistance, unspecified: Secondary | ICD-10-CM | POA: Diagnosis not present

## 2023-01-23 DIAGNOSIS — Z6827 Body mass index (BMI) 27.0-27.9, adult: Secondary | ICD-10-CM

## 2023-01-23 DIAGNOSIS — M459 Ankylosing spondylitis of unspecified sites in spine: Secondary | ICD-10-CM | POA: Insufficient documentation

## 2023-01-23 DIAGNOSIS — E669 Obesity, unspecified: Secondary | ICD-10-CM

## 2023-01-23 DIAGNOSIS — F418 Other specified anxiety disorders: Secondary | ICD-10-CM

## 2023-01-23 DIAGNOSIS — Z6825 Body mass index (BMI) 25.0-25.9, adult: Secondary | ICD-10-CM

## 2023-01-23 DIAGNOSIS — E559 Vitamin D deficiency, unspecified: Secondary | ICD-10-CM | POA: Diagnosis not present

## 2023-01-23 MED ORDER — SERTRALINE HCL 25 MG PO TABS: 25.0000 mg | ORAL_TABLET | Freq: Every day | ORAL | 0 refills | Status: DC

## 2023-01-23 MED ORDER — VITAMIN D (ERGOCALCIFEROL) 1.25 MG (50000 UNIT) PO CAPS: 50000.0000 [IU] | ORAL_CAPSULE | ORAL | 0 refills | Status: DC

## 2023-01-23 MED ORDER — METFORMIN HCL 500 MG PO TABS: 500.0000 mg | ORAL_TABLET | Freq: Two times a day (BID) | ORAL | 0 refills | Status: DC

## 2023-02-07 ENCOUNTER — Other Ambulatory Visit (INDEPENDENT_AMBULATORY_CARE_PROVIDER_SITE_OTHER): Payer: Self-pay | Admitting: Family Medicine

## 2023-02-07 DIAGNOSIS — F418 Other specified anxiety disorders: Secondary | ICD-10-CM

## 2023-02-16 NOTE — Progress Notes (Signed)
TeleHealth Visit:  This visit was completed with telemedicine (audio/video) technology. Sara Patel has verbally consented to this TeleHealth visit. The patient is located at home, the provider is located at home. The participants in this visit include the listed provider and patient. The visit was conducted today via MyChart video.  OBESITY Sara Patel is here to discuss her progress with her obesity treatment plan along with follow-up of her obesity related diagnoses.    Today's visit was # 25  Starting weight: 186 lbs Starting date: 01/07/2020 Weight at last in office visit: 154 lbs on 07/11/22 Total weight loss: 32 lbs at last in office visit on 07/11/22. Weight reported at last virtual office visit: 144 lbs on 01/23/23 Today's reported weight (02/20/23):  145 lbs Total weight loss: 41 Weight change since last visit: 145  Nutrition Plan: keeping a food journal with goal of 1500 calories and 80 grams of protein daily - 50% adherence.  Current exercise: Has not been exercising the past few weeks but trying to be active   Interim History:  Sara Patel is working on weight maintenance.  She wants to remain around 145 pounds and reported weight today was at goal.  She journals the beginning of the week but not the rest of the week just to make sure she is hitting protein and calorie goals.  She is working on finding the balance of healthy eating and still enjoying foods that she likes. Water and protein intake are good.  She cooks dinner at home.  She works at home.  She forgot to go get her labs drawn at [facility] but she will do this before next visit.  She would like to continue follow-up every 4 weeks for now.  Assessment/Plan:  1. Insulin Resistance Last fasting insulin was 6.7. A1c was 5.6. Polyphagia:No Medication(s): Metformin 500 mg twice daily with meals Lab Results  Component Value Date   HGBA1C 5.6 07/11/2022   HGBA1C 5.5 04/28/2021   HGBA1C 5.4 05/12/2020   HGBA1C 5.5  01/07/2020   HGBA1C 5.7 01/18/2016   Lab Results  Component Value Date   INSULIN 6.7 07/11/2022   INSULIN 6.1 04/28/2021   INSULIN 6.1 05/12/2020   INSULIN 8.1 01/07/2020    Plan: Continue Metformin 500 mg twice daily with meals She will go to [facility] to have labs drawn before next visit  2. Generalized Obesity: Current BMI 25 Sara Patel is currently in the action stage of change. As such, her goal is to maintain weight for now.  She has agreed to keeping a food journal with goal of 1500 calories and 80 grams of protein daily and practicing portion control and making smarter food choices, such as increasing vegetables and decreasing simple carbohydrates.  Exercise goals: Resume exercise regimen of resistance training.  Continue to be active.  Behavioral modification strategies: increasing lean protein intake, decreasing simple carbohydrates , and planning for success.  Sara Patel has agreed to follow-up with our clinic in 4 weeks.   No orders of the defined types were placed in this encounter.   There are no discontinued medications.   No orders of the defined types were placed in this encounter.     Objective:   VITALS: Per patient if applicable, see vitals. GENERAL: Alert and in no acute distress. CARDIOPULMONARY: No increased WOB. Speaking in clear sentences.  PSYCH: Pleasant and cooperative. Speech normal rate and rhythm. Affect is appropriate. Insight and judgement are appropriate. Attention is focused, linear, and appropriate.  NEURO: Oriented as arrived to appointment on time  with no prompting.   Attestation Statements:   Reviewed by clinician on day of visit: allergies, medications, problem list, medical history, surgical history, family history, social history, and previous encounter notes.  This was prepared with the assistance of [employer].  Occasional wrong-word or sound-a-like substitutions may have occurred due to the inherent limitations of voice  recognition software.

## 2023-02-20 ENCOUNTER — Encounter (INDEPENDENT_AMBULATORY_CARE_PROVIDER_SITE_OTHER): Payer: Self-pay | Admitting: Family Medicine

## 2023-02-20 ENCOUNTER — Telehealth (INDEPENDENT_AMBULATORY_CARE_PROVIDER_SITE_OTHER): Payer: BC Managed Care – PPO | Admitting: Family Medicine

## 2023-02-20 VITALS — Ht 63.0 in | Wt 145.0 lb

## 2023-02-20 DIAGNOSIS — E669 Obesity, unspecified: Secondary | ICD-10-CM | POA: Diagnosis not present

## 2023-02-20 DIAGNOSIS — Z6825 Body mass index (BMI) 25.0-25.9, adult: Secondary | ICD-10-CM

## 2023-02-20 DIAGNOSIS — E88819 Insulin resistance, unspecified: Secondary | ICD-10-CM

## 2023-03-15 ENCOUNTER — Encounter (INDEPENDENT_AMBULATORY_CARE_PROVIDER_SITE_OTHER): Payer: Self-pay | Admitting: Family Medicine

## 2023-03-20 ENCOUNTER — Telehealth (INDEPENDENT_AMBULATORY_CARE_PROVIDER_SITE_OTHER): Payer: BC Managed Care – PPO | Admitting: Family Medicine

## 2023-04-25 ENCOUNTER — Encounter (INDEPENDENT_AMBULATORY_CARE_PROVIDER_SITE_OTHER): Payer: Self-pay | Admitting: Family Medicine

## 2023-04-25 ENCOUNTER — Telehealth (INDEPENDENT_AMBULATORY_CARE_PROVIDER_SITE_OTHER): Payer: BC Managed Care – PPO | Admitting: Family Medicine

## 2023-04-25 VITALS — Ht 63.0 in | Wt 144.0 lb

## 2023-04-25 DIAGNOSIS — E559 Vitamin D deficiency, unspecified: Secondary | ICD-10-CM

## 2023-04-25 DIAGNOSIS — E669 Obesity, unspecified: Secondary | ICD-10-CM | POA: Diagnosis not present

## 2023-04-25 DIAGNOSIS — E88819 Insulin resistance, unspecified: Secondary | ICD-10-CM | POA: Diagnosis not present

## 2023-04-25 DIAGNOSIS — Z6825 Body mass index (BMI) 25.0-25.9, adult: Secondary | ICD-10-CM

## 2023-04-25 DIAGNOSIS — F418 Other specified anxiety disorders: Secondary | ICD-10-CM

## 2023-04-25 MED ORDER — VITAMIN D (ERGOCALCIFEROL) 1.25 MG (50000 UNIT) PO CAPS: 50000.0000 [IU] | ORAL_CAPSULE | ORAL | 0 refills | Status: AC

## 2023-04-25 MED ORDER — BUPROPION HCL ER (SR) 150 MG PO TB12
150.0000 mg | ORAL_TABLET | Freq: Two times a day (BID) | ORAL | 0 refills | Status: DC
Start: 2023-04-25 — End: 2023-07-05

## 2023-04-25 MED ORDER — SERTRALINE HCL 25 MG PO TABS: 25.0000 mg | ORAL_TABLET | Freq: Every day | ORAL | 0 refills | Status: DC

## 2023-04-25 MED ORDER — METFORMIN HCL 500 MG PO TABS: 500.0000 mg | ORAL_TABLET | Freq: Two times a day (BID) | ORAL | 0 refills | Status: DC

## 2023-04-25 NOTE — Progress Notes (Signed)
TeleHealth Visit:  This visit was completed with telemedicine (audio/video) technology. Sara Patel has verbally consented to this TeleHealth visit. The patient is located at home, the provider is located at home. The participants in this visit include the listed provider and patient. The visit was conducted today via MyChart video.  OBESITY Sara Patel is here to discuss her progress with her obesity treatment plan along with follow-up of her obesity related diagnoses.   Today's visit was # 26  Starting weight: 186 lbs Starting date: 01/07/2020 Weight reported at last virtual office visit: 145 lbs on 02/20/23 Today's reported weight (04/25/23):  144 lbs Total weight loss: 32 lbs Weight change since last visit: -1 lbs  Nutrition Plan: keeping a food journal with goal of 1500 calories and 80 grams of protein daily and practicing portion control and making smarter food choices, such as increasing vegetables and decreasing simple carbohydrates  Current exercise: weight training 30 minutes 3 x week. Walks 1-2 times week.   Interim History:  She is still maintaining her weight well.  Goal weight is 145 pounds. She is still journaling the first of the week but not the rest of there week but usually eats the same all week. Denies excessive appetite. Eats out once weekly on the weekend. She is concerned about weight gain after surgery and plans on journaling more frequently.  Goal weight is 145 lbs and she is maintaining well around 145 lbs.   Assessment/Plan:  1.  Anxiety with depression Stable.  Well-controlled with sertraline 25 mg daily.  Plan: Refill Zoloft 25 mg daily.  Insulin Resistance Last fasting insulin was low at 6.7.. A1c was 5.6 on 07/11/2022.. Polyphagia:No Medication(s): Metformin 500 mg twice daily with meals Lab Results  Component Value Date   HGBA1C 5.6 07/11/2022   HGBA1C 5.5 04/28/2021   HGBA1C 5.4 05/12/2020   HGBA1C 5.5 01/07/2020   HGBA1C 5.7  01/18/2016   Lab Results  Component Value Date   INSULIN 6.7 07/11/2022   INSULIN 6.1 04/28/2021   INSULIN 6.1 05/12/2020   INSULIN 8.1 01/07/2020    Plan: Continue and refill Metformin 500 mg twice daily with meals Labs are already ordered.  She will go to [facility] after she recovers from her bunion surgery to have them drawn.    3. Vitamin D Deficiency Vitamin D is not at goal of 50.  Most recent vitamin D level was 28.2.[name] She is on  prescription ergocalciferol 50,000 IU weekly. Lab Results  Component Value Date   VD25OH 28.2 (L) 07/11/2022   VD25OH 27.0 (L) 04/28/2021   VD25OH 27.3 (L) 05/12/2020    Plan: Continue and refill  prescription ergocalciferol 50,000 IU weekly   4. Generalized Obesity: Current BMI 25   [name] is currently in the action stage of change. As such, her goal is to maintain weight for now.  She has agreed to keeping a food journal with goal of 1500 calories and 80 grams of protein daily.  1.  She will journal more frequently after bunion surgery to ensure she maintains her weight.  Exercise goals: No exercise has been prescribed at this time.  Behavioral modification strategies: planning for success and increase frequency of journaling.  [name] has agreed to follow-up with our clinic in 4 weeks.  No orders of the defined types were placed in this encounter.   Medications Discontinued During This Encounter  Medication Reason   buPROPion (WELLBUTRIN SR) 150 MG 12 hr tablet Reorder   sertraline (ZOLOFT) 25 MG tablet Reorder  metFORMIN (GLUCOPHAGE) 500 MG tablet Reorder   Vitamin D, Ergocalciferol, (DRISDOL) 1.25 MG (50000 UNIT) CAPS capsule Reorder     Meds ordered this encounter  Medications   Vitamin D, Ergocalciferol, (DRISDOL) 1.25 MG (50000 UNIT) CAPS capsule    Sig: Take 1 capsule (50,000 Units total) by mouth every 7 (seven) days.    Dispense:  12 capsule    Refill:  0    Order Specific Question:   Supervising Provider     Answer:   Glennis Brink [[id number]]   buPROPion (WELLBUTRIN SR) 150 MG 12 hr tablet    Sig: Take 1 tablet (150 mg total) by mouth 2 (two) times daily.    Dispense:  180 tablet    Refill:  0    Order Specific Question:   Supervising Provider    Answer:   Glennis Brink [[id number]]   metFORMIN (GLUCOPHAGE) 500 MG tablet    Sig: Take 1 tablet (500 mg total) by mouth 2 (two) times daily with a meal.    Dispense:  180 tablet    Refill:  0    Order Specific Question:   Supervising Provider    Answer:   Glennis Brink [[id number]]   sertraline (ZOLOFT) 25 MG tablet    Sig: Take 1 tablet (25 mg total) by mouth daily.    Dispense:  90 tablet    Refill:  0    Order Specific Question:   Supervising Provider    Answer:   Glennis Brink [[id number]]      Objective:   VITALS: Per patient if applicable, see vitals. GENERAL: Alert and in no acute distress. CARDIOPULMONARY: No increased WOB. Speaking in clear sentences.  PSYCH: Pleasant and cooperative. Speech normal rate and rhythm. Affect is appropriate. Insight and judgement are appropriate. Attention is focused, linear, and appropriate.  NEURO: Oriented as arrived to appointment on time with no prompting.   Attestation Statements:   Reviewed by clinician on day of visit: allergies, medications, problem list, medical history, surgical history, family history, social history, and previous encounter notes.   This was prepared with the assistance of [employer].  Occasional wrong-word or sound-a-like substitutions may have occurred due to the inherent limitations of voice recognition software.

## 2023-05-24 ENCOUNTER — Encounter (INDEPENDENT_AMBULATORY_CARE_PROVIDER_SITE_OTHER): Payer: Self-pay | Admitting: Family Medicine

## 2023-05-24 ENCOUNTER — Telehealth (INDEPENDENT_AMBULATORY_CARE_PROVIDER_SITE_OTHER): Payer: BC Managed Care – PPO | Admitting: Family Medicine

## 2023-05-24 VITALS — Ht 63.0 in | Wt 145.0 lb

## 2023-05-24 DIAGNOSIS — E88819 Insulin resistance, unspecified: Secondary | ICD-10-CM

## 2023-05-24 DIAGNOSIS — E669 Obesity, unspecified: Secondary | ICD-10-CM

## 2023-05-24 DIAGNOSIS — Z6825 Body mass index (BMI) 25.0-25.9, adult: Secondary | ICD-10-CM | POA: Diagnosis not present

## 2023-05-24 DIAGNOSIS — M459 Ankylosing spondylitis of unspecified sites in spine: Secondary | ICD-10-CM | POA: Diagnosis not present

## 2023-05-24 NOTE — Progress Notes (Signed)
TeleHealth Visit:  This visit was completed with telemedicine (audio/video) technology. Sara Patel has verbally consented to this TeleHealth visit. The patient is located at home, the provider is located at home. The participants in this visit include the listed provider and patient. The visit was conducted today via MyChart video.  OBESITY Sara Patel is here to discuss her progress with her obesity treatment plan along with follow-up of her obesity related diagnoses.    Today's visit was # 27  Starting weight: 186 lbs Starting date: 01/07/2020 Weight reported at last virtual office visit: 144 lbs on 04/25/23 Today's reported weight (05/24/23):  145 lbs Total weight loss: 41 lbs Weight change since last visit: +1 lbs  Nutrition Plan: keeping a food journal with goal of 1500 calories and 80 grams of protein daily   Current exercise: some upper body exercises and Pilates  Interim History:  She has maintained her weight at 145 lbs. Reports she works to give herself positive affirnations. She had a right bunionectomy on 04/26/23 and is recovering well. Surgery on left is August 23. She has been journaling and also supporting her husband who is a patient. Meeting protein (90 grams and calorie goal 1300 calories/day)  Assessment/Plan:  1. Ankylosing spondylitis Recently started Cimzia. THis si working well for both ankylosing spondylitis and also her Crohn's disease.  Her back pain is significantly improved.  Plan: Follow-up with rheumatology as directed.  2.  Insulin resistance Last A1c was 5.6 on 07/11/2022.  She will have labs drawn within the next month.  She is still unable to drive post bunionectomy.  She wonders if she could stop taking the metformin.  I am open to discontinuing and see how her A1c does.  She reports she feels it may help with cravings. Has family history of diabetes. Medication(s): Metformin 500 mg twice daily with meals Polyphagia:No Lab Results   Component Value Date   HGBA1C 5.6 07/11/2022   HGBA1C 5.5 04/28/2021   HGBA1C 5.4 05/12/2020   HGBA1C 5.5 01/07/2020   HGBA1C 5.7 01/18/2016   Lab Results  Component Value Date   INSULIN 6.7 07/11/2022   INSULIN 6.1 04/28/2021   INSULIN 6.1 05/12/2020   INSULIN 8.1 01/07/2020    Plan: Hold metformin for a few weeks. May resume if she notices a difference in hunger or cravings.   3. Generalized Obesity: Current BMI 25  Sara Patel is currently in the action stage of change. As such, her goal is to maintain weight for now.  She has agreed to keeping a food journal with goal of 1500 calories and 80 grams of protein daily.  Exercise goals: as is  Behavioral modification strategies: planning for success.  Sara Patel has agreed to follow-up with our clinic in 6 weeks.  No orders of the defined types were placed in this encounter.   Medications Discontinued During This Encounter  Medication Reason   Tofacitinib Citrate (XELJANZ PO)    ustekinumab (STELARA) 90 MG/ML SOSY injection      No orders of the defined types were placed in this encounter.     Objective:   VITALS: Per patient if applicable, see vitals. GENERAL: Alert and in no acute distress. CARDIOPULMONARY: No increased WOB. Speaking in clear sentences.  PSYCH: Pleasant and cooperative. Speech normal rate and rhythm. Affect is appropriate. Insight and judgement are appropriate. Attention is focused, linear, and appropriate.  NEURO: Oriented as arrived to appointment on time with no prompting.   Attestation Statements:   Reviewed by clinician on day of  visit: allergies, medications, problem list, medical history, surgical history, family history, social history, and previous encounter notes.  Time spent on visit including the items listed below was 30 minutes.  -preparing to see the patient (e.g., review of tests, history, previous notes) -obtaining and/or reviewing separately obtained history -counseling and  educating the patient/family/caregiver -documenting clinical information in the electronic or other health record -ordering medications, tests, or procedures -independently interpreting results and communicating results to the patient/ family/caregiver -referring and communicating with other health care professionals  -care coordination    This was prepared with the assistance of [employer].  Occasional wrong-word or sound-a-like substitutions may have occurred due to the inherent limitations of voice recognition software.

## 2023-07-04 NOTE — Progress Notes (Signed)
TeleHealth Visit:  This visit was completed with telemedicine (audio/video) technology. Sara Patel has verbally consented to this TeleHealth visit. The patient is located at home, the provider is located at home. The participants in this visit include the listed provider and patient. The visit was conducted today via MyChart video.  OBESITY Sara Patel is here to discuss her progress with her obesity treatment plan along with follow-up of her obesity related diagnoses.    Today's visit was # 28  Starting weight: 186 lbs Starting date: 01/07/20 Weight reported at last virtual office visit: 145 lbs on 05/24/23 Today's reported weight (07/05/23):  145 lbs Total weight loss: 41 lbs Weight change since last visit: 0 lbs  Nutrition Plan: keeping a food journal with goal of 1500 calories and 80 grams of protein daily -   Current exercise: none   Interim History:  She is in the maintenance phase and maintaining her weight loss well at 145 pounds. Recovering from right bunion surgery bunion surgery and will have the left one done this week. Protein intake is good. She journals occasionally to keep herself on track. She would like to lose a little more weight because she will be sedentary the next few months.  She thinks a small weight loss goal will help her to stay on track with her eating.  We decided to set 140 pounds as her goal.  Assessment/Plan:  1.  Anxiety and depression Stable.  She is taking bupropion SR 150 mg twice daily and sertraline 25 mg daily.  Plan: Refill bupropion SR 150 mg twice daily. Refill sertraline 25 mg daily.  2. Insulin Resistance Last fasting insulin was 6.7. A1c was 5.6. Polyphagia:No Medication(s): Metformin 500 mg twice daily with meals Lab Results  Component Value Date   HGBA1C 5.6 07/11/2022   HGBA1C 5.5 04/28/2021   HGBA1C 5.4 05/12/2020   HGBA1C 5.5 01/07/2020   HGBA1C 5.7 01/18/2016   Lab Results  Component Value Date   INSULIN 6.7  07/11/2022   INSULIN 6.1 04/28/2021   INSULIN 6.1 05/12/2020   INSULIN 8.1 01/07/2020    Plan: Continue and refill Metformin 500 mg twice daily with meals  3. Generalized Obesity: Current BMI 25  [name] is currently in the action stage of change. As such, her goal is to continue with weight loss efforts.  She has agreed to keeping a food journal with goal of 1300 calories and 80 grams of protein daily.  Exercise goals: none. She will be having left bunionectomy this week.  Behavioral modification strategies: increasing lean protein intake, decreasing simple carbohydrates , keep a strict food journal, and increase frequency of journaling.  [name] has agreed to follow-up with our clinic in 8 weeks.  No orders of the defined types were placed in this encounter.   Medications Discontinued During This Encounter  Medication Reason   buPROPion (WELLBUTRIN SR) 150 MG 12 hr tablet Reorder   metFORMIN (GLUCOPHAGE) 500 MG tablet Reorder   sertraline (ZOLOFT) 25 MG tablet Reorder     Meds ordered this encounter  Medications   buPROPion (WELLBUTRIN SR) 150 MG 12 hr tablet    Sig: Take 1 tablet (150 mg total) by mouth 2 (two) times daily.    Dispense:  180 tablet    Refill:  0    Order Specific Question:   Supervising Provider    Answer:   Glennis Brink [[id number]]   metFORMIN (GLUCOPHAGE) 500 MG tablet    Sig: Take 1 tablet (500 mg total) by mouth  2 (two) times daily with a meal.    Dispense:  180 tablet    Refill:  0    Order Specific Question:   Supervising Provider    Answer:   Glennis Brink [[id number]]   sertraline (ZOLOFT) 25 MG tablet    Sig: Take 1 tablet (25 mg total) by mouth daily.    Dispense:  90 tablet    Refill:  0    Order Specific Question:   Supervising Provider    Answer:   Glennis Brink [[id number]]      Objective:   VITALS: Per patient if applicable, see vitals. GENERAL: Alert and in no acute distress. CARDIOPULMONARY: No increased WOB. Speaking in clear  sentences.  PSYCH: Pleasant and cooperative. Speech normal rate and rhythm. Affect is appropriate. Insight and judgement are appropriate. Attention is focused, linear, and appropriate.  NEURO: Oriented as arrived to appointment on time with no prompting.   Attestation Statements:   Reviewed by clinician on day of visit: allergies, medications, problem list, medical history, surgical history, family history, social history, and previous encounter notes.   This was prepared with the assistance of [employer].  Occasional wrong-word or sound-a-like substitutions may have occurred due to the inherent limitations of voice recognition software.

## 2023-07-05 ENCOUNTER — Telehealth (INDEPENDENT_AMBULATORY_CARE_PROVIDER_SITE_OTHER): Payer: BC Managed Care – PPO | Admitting: Family Medicine

## 2023-07-05 ENCOUNTER — Encounter (INDEPENDENT_AMBULATORY_CARE_PROVIDER_SITE_OTHER): Payer: Self-pay | Admitting: Family Medicine

## 2023-07-05 VITALS — Ht 63.0 in | Wt 145.0 lb

## 2023-07-05 DIAGNOSIS — E669 Obesity, unspecified: Secondary | ICD-10-CM

## 2023-07-05 DIAGNOSIS — Z6825 Body mass index (BMI) 25.0-25.9, adult: Secondary | ICD-10-CM

## 2023-07-05 DIAGNOSIS — F32A Depression, unspecified: Secondary | ICD-10-CM | POA: Diagnosis not present

## 2023-07-05 DIAGNOSIS — E88819 Insulin resistance, unspecified: Secondary | ICD-10-CM | POA: Diagnosis not present

## 2023-07-05 DIAGNOSIS — F419 Anxiety disorder, unspecified: Secondary | ICD-10-CM | POA: Diagnosis not present

## 2023-07-05 DIAGNOSIS — F418 Other specified anxiety disorders: Secondary | ICD-10-CM

## 2023-07-05 MED ORDER — METFORMIN HCL 500 MG PO TABS
500.0000 mg | ORAL_TABLET | Freq: Two times a day (BID) | ORAL | 0 refills | Status: AC
Start: 2023-07-05 — End: ?

## 2023-07-05 MED ORDER — SERTRALINE HCL 25 MG PO TABS
25.0000 mg | ORAL_TABLET | Freq: Every day | ORAL | 0 refills | Status: AC
Start: 2023-07-05 — End: ?

## 2023-07-05 MED ORDER — BUPROPION HCL ER (SR) 150 MG PO TB12
150.0000 mg | ORAL_TABLET | Freq: Two times a day (BID) | ORAL | 0 refills | Status: AC
Start: 2023-07-05 — End: ?

## 2024-01-02 ENCOUNTER — Ambulatory Visit (INDEPENDENT_AMBULATORY_CARE_PROVIDER_SITE_OTHER): Payer: BC Managed Care – PPO | Admitting: Family Medicine
